# Patient Record
Sex: Female | Born: 1965 | Race: White | Hispanic: No | Smoking: Former smoker
Health system: Southern US, Community
[De-identification: ages and names within clinical notes are randomized; demographics above are authoritative.]

## PROBLEM LIST (undated history)

## (undated) DIAGNOSIS — E669 Obesity, unspecified: Secondary | ICD-10-CM

## (undated) DIAGNOSIS — D869 Sarcoidosis, unspecified: Secondary | ICD-10-CM

## (undated) DIAGNOSIS — R569 Unspecified convulsions: Secondary | ICD-10-CM

## (undated) DIAGNOSIS — F410 Panic disorder [episodic paroxysmal anxiety] without agoraphobia: Secondary | ICD-10-CM

## (undated) DIAGNOSIS — K219 Gastro-esophageal reflux disease without esophagitis: Secondary | ICD-10-CM

## (undated) DIAGNOSIS — I10 Essential (primary) hypertension: Secondary | ICD-10-CM

## (undated) DIAGNOSIS — E119 Type 2 diabetes mellitus without complications: Secondary | ICD-10-CM

## (undated) DIAGNOSIS — M7070 Other bursitis of hip, unspecified hip: Secondary | ICD-10-CM

## (undated) DIAGNOSIS — M1990 Unspecified osteoarthritis, unspecified site: Secondary | ICD-10-CM

## (undated) DIAGNOSIS — E785 Hyperlipidemia, unspecified: Secondary | ICD-10-CM

## (undated) HISTORY — DX: Other bursitis of hip, unspecified hip: M70.70

## (undated) HISTORY — DX: Sarcoidosis, unspecified: D86.9

## (undated) HISTORY — PX: CHOLECYSTECTOMY: SHX55

## (undated) HISTORY — DX: Obesity, unspecified: E66.9

## (undated) HISTORY — DX: Unspecified osteoarthritis, unspecified site: M19.90

## (undated) HISTORY — DX: Hyperlipidemia, unspecified: E78.5

## (undated) HISTORY — DX: Gastro-esophageal reflux disease without esophagitis: K21.9

## (undated) HISTORY — DX: Type 2 diabetes mellitus without complications: E11.9

## (undated) HISTORY — DX: Unspecified convulsions: R56.9

## (undated) HISTORY — DX: Essential (primary) hypertension: I10

## (undated) HISTORY — DX: Panic disorder (episodic paroxysmal anxiety): F41.0

---

## 1999-02-08 ENCOUNTER — Other Ambulatory Visit: Admission: RE | Admit: 1999-02-08 | Discharge: 1999-02-08 | Payer: Self-pay | Admitting: Family Medicine

## 2000-03-27 ENCOUNTER — Other Ambulatory Visit: Admission: RE | Admit: 2000-03-27 | Discharge: 2000-03-27 | Payer: Self-pay | Admitting: Family Medicine

## 2001-05-22 ENCOUNTER — Other Ambulatory Visit: Admission: RE | Admit: 2001-05-22 | Discharge: 2001-05-22 | Payer: Self-pay | Admitting: Family Medicine

## 2002-09-30 ENCOUNTER — Other Ambulatory Visit: Admission: RE | Admit: 2002-09-30 | Discharge: 2002-09-30 | Payer: Self-pay | Admitting: Family Medicine

## 2003-10-20 ENCOUNTER — Other Ambulatory Visit: Admission: RE | Admit: 2003-10-20 | Discharge: 2003-10-20 | Payer: Self-pay | Admitting: Family Medicine

## 2004-10-18 ENCOUNTER — Other Ambulatory Visit: Admission: RE | Admit: 2004-10-18 | Discharge: 2004-10-18 | Payer: Self-pay | Admitting: *Deleted

## 2005-01-13 ENCOUNTER — Encounter (INDEPENDENT_AMBULATORY_CARE_PROVIDER_SITE_OTHER): Payer: Self-pay | Admitting: *Deleted

## 2005-01-13 ENCOUNTER — Encounter: Payer: Self-pay | Admitting: Pulmonary Disease

## 2005-01-13 ENCOUNTER — Ambulatory Visit (HOSPITAL_COMMUNITY): Admission: RE | Admit: 2005-01-13 | Discharge: 2005-01-13 | Payer: Self-pay | Admitting: Thoracic Surgery

## 2005-11-14 ENCOUNTER — Other Ambulatory Visit: Admission: RE | Admit: 2005-11-14 | Discharge: 2005-11-14 | Payer: Self-pay | Admitting: Family Medicine

## 2007-02-21 ENCOUNTER — Other Ambulatory Visit: Admission: RE | Admit: 2007-02-21 | Discharge: 2007-02-21 | Payer: Self-pay | Admitting: Family Medicine

## 2009-02-26 ENCOUNTER — Ambulatory Visit: Payer: Self-pay | Admitting: Cardiovascular Disease

## 2009-02-26 ENCOUNTER — Ambulatory Visit: Payer: Self-pay | Admitting: Cardiology

## 2009-02-26 DIAGNOSIS — K219 Gastro-esophageal reflux disease without esophagitis: Secondary | ICD-10-CM

## 2009-02-26 DIAGNOSIS — I10 Essential (primary) hypertension: Secondary | ICD-10-CM

## 2009-02-26 LAB — CONVERTED CEMR LAB
CO2: 30 meq/L (ref 19–32)
Chloride: 100 meq/L (ref 96–112)
GFR calc non Af Amer: 115.75 mL/min (ref >60)
Glucose, Bld: 122 mg/dL — ABNORMAL HIGH (ref 70–99)
Hemoglobin: 13.2 g/dL (ref 12.0–15.0)
Lymphocytes Relative: 20.2 % (ref 12.0–46.0)
MCHC: 34.5 g/dL (ref 30.0–36.0)
Monocytes Absolute: 0.7 10*3/uL (ref 0.1–1.0)
Neutro Abs: 6.4 10*3/uL (ref 1.4–7.7)
Platelets: 271 10*3/uL (ref 150.0–400.0)
Potassium: 3.6 meq/L (ref 3.5–5.1)
RDW: 11.4 % — ABNORMAL LOW (ref 11.5–14.6)
Sed Rate: 54 mm/hr — ABNORMAL HIGH (ref 0–22)
TSH: 1.74 microintl units/mL (ref 0.35–5.50)
WBC: 9 10*3/uL (ref 4.5–10.5)

## 2009-03-01 ENCOUNTER — Encounter: Payer: Self-pay | Admitting: Cardiovascular Disease

## 2009-03-01 ENCOUNTER — Ambulatory Visit (HOSPITAL_COMMUNITY): Admission: RE | Admit: 2009-03-01 | Discharge: 2009-03-01 | Payer: Self-pay | Admitting: Cardiovascular Disease

## 2009-03-01 ENCOUNTER — Ambulatory Visit: Payer: Self-pay

## 2009-03-18 ENCOUNTER — Ambulatory Visit: Payer: Self-pay | Admitting: Pulmonary Disease

## 2009-03-18 DIAGNOSIS — J309 Allergic rhinitis, unspecified: Secondary | ICD-10-CM | POA: Insufficient documentation

## 2009-03-18 DIAGNOSIS — R0602 Shortness of breath: Secondary | ICD-10-CM | POA: Insufficient documentation

## 2009-04-01 ENCOUNTER — Ambulatory Visit: Payer: Self-pay | Admitting: Pulmonary Disease

## 2009-04-01 ENCOUNTER — Encounter: Payer: Self-pay | Admitting: Internal Medicine

## 2009-04-01 ENCOUNTER — Ambulatory Visit (HOSPITAL_COMMUNITY): Admission: RE | Admit: 2009-04-01 | Discharge: 2009-04-01 | Payer: Self-pay | Admitting: Pulmonary Disease

## 2009-04-15 ENCOUNTER — Telehealth (INDEPENDENT_AMBULATORY_CARE_PROVIDER_SITE_OTHER): Payer: Self-pay | Admitting: *Deleted

## 2009-05-03 ENCOUNTER — Ambulatory Visit: Payer: Self-pay | Admitting: Pulmonary Disease

## 2011-01-01 ENCOUNTER — Encounter: Payer: Self-pay | Admitting: Orthopedic Surgery

## 2011-03-23 LAB — BLOOD GAS, ARTERIAL
FIO2: 0.21 %
O2 Saturation: 95.9 %
pCO2 arterial: 40.3 mmHg (ref 35.0–45.0)

## 2011-04-25 NOTE — Assessment & Plan Note (Signed)
[facility]                            CARDIOLOGY OFFICE NOTE   Ellen Delgado, Ellen Delgado                        MRN:          [id number]  DATE:02/26/2009                            DOB:          July 23, 1966    A 45 year old patient was seen by Dr. Mamie Laurel complaining of some  atypical chest pain, some fluttering in her ears, increasing shortness  of breath.  Unfortunately, the patient is a horrible historian.  She  apparently had some chronic lung problem and has been on inhalers for  quite sometime.  She has had a previous what sounds like tracheal  procedure by Dr. Edwyna Shell.   She is a previous smoker and is currently not smoking.  She has not had  any recent cough or shortness of breath is functional, but seems real.  She has not had any active wheezing.   From a cardiac standpoint, she has never had a problem.  Her pulses have  been little bit elevated lately.  She has not had any recent lab work.  I reviewed her chest x-ray, Birdena Jubilee did in our office and she has  elevated diaphragms, but no active lung disease that I can see and no  cardiomegaly.   In terms of her symptoms, she has had no history of DVT or PE.  She has  had no increase in lower extremity edema.  She has been overweight and  this is chronic.   Chest pain was in the center of her chest.  It is fleeting.  It is a 1  time episode.  She has no exertional chest pain.  There has been no  associated diaphoresis.   Her past medical history is otherwise remarkable for previous smoking,  previous surgery by Dr. Edwyna Shell on her trachea for inflammation,  previous gallbladder surgery.   Her review of systems otherwise remarkable for occasional swelling in  her ankles currently stable, chronic shortness of breath, and question  of high blood pressure.   She is married.  She has had 3 miscarriages and has no children.  She is  a previous smoker.  She does not drink.  She is currently  unemployed and  used to work at [employer].   She is sedentary.  Family history is unremarkable.  She is on Tegretol  400 a day, Nexium 40 a day, fexofenadine 180 a day, Arthrotec, Ventolin,  and Advair inhalers, lisinopril, HCTZ 22 and 25.   PHYSICAL EXAMINATION:  GENERAL:  Her exam is remarkable for an obese  female in no distress.  VITAL SIGNS:  Pulse is 98, blood pressure is 150/80, weight is 230.  HEENT:  Unremarkable.  Carotids normal without bruit.  No  lymphadenopathy, thyromegaly, or JVP elevation.  She has a subtracheal  scar.  LUNGS:  Have decreased diaphragmatic motion, but no active wheezing or  rhonchi.  HEART:  S1 and S2, distant heart sounds.  PMI not palpable.  ABDOMEN:  Protuberant with striae.  No AAA.  No tenderness.  No bruit.  No hepatosplenomegaly or hepatojugular reflux.  EXTREMITIES:  Distal pulses are intact.  No edema.  NEURO:  Nonfocal.  SKIN:  Warm and dry.  MUSCULOSKELETAL:  No muscular weakness.   EKG is normal outside of tachycardia.  There is low voltage due to body  habitus.  Chest x-ray from [facility] office reviewed.   IMPRESSION:  1. The patient has some sort of chronic lung disease and is on      inhalers.  She is a previous smoker with a previous tracheal      procedure.  I will refer her to pulmonary.  I suspect, she will      need further workup with pulmonary function tests given an acute on      chronic shortness of breath.  We will check a chest computerized      tomography to rule out pulmonary embolism and do high-resolution      lung windows.  2. Relative tachycardia and dyspnea.  Check 2-D echocardiogram.  Her      blood pressure needs a little bit better control.  We will add      Coreg 6.25 b.i.d. and check her 2-D echocardiogram to rule out a      cardiac problem.  In light of her symptoms, I would also like to check some basic blood  work to make sure she is not anemic or hyperthyroid.  We will do a  baseline blood gases  well and a sed rate to rule out active connective  tissue disease.  1. Hypertension.  Add Coreg.  Continue lisinopril,      hydrochlorothiazide.  2. Previous history of seizures.  Continue Tegretol.   I will see the patient back in 8 weeks, and we will try to get her into  see pulmonary as well.  I will go over these tests with her.     Noralyn Pick. Eden Emms, MD, [facility]  Electronically Signed    PCN/MedQ  DD: 02/26/2009  DT: 02/27/2009  Job #: [id number]   cc:   Paulita Cradle, FNP

## 2011-04-28 NOTE — Op Note (Signed)
NAMEJANITH, NIELSON NO.:  [id number]   MEDICAL RECORD NO.:  [id number]          PATIENT TYPE:  OIB   LOCATION:  2899                         FACILITY:  [facility]   PHYSICIAN:  Ines Bloomer, M.D. DATE OF BIRTH:  1966/03/09   DATE OF PROCEDURE:  01/13/2005  DATE OF DISCHARGE:                                 OPERATIVE REPORT   PREOPERATIVE DIAGNOSIS:  Bilateral hilar adenopathy.   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   PROCEDURE:  Fiberoptic bronchoscopy with a video and video mediastinoscopy.   SURGEON:  Ines Bloomer, M.D.   ANESTHESIA:  General anesthesia.   DESCRIPTION OF PROCEDURE:  After adequate general anesthesia, the fiberoptic  video bronchoscope was passed through the endotracheal tube.  The carina was  in the midline.  The left mainstem, left upper lobe, and left lower lobe  orifices were normal.  The right mainstem, right upper lobe, and right  middle lobe, and right lower lobe orifices were normal.  The washings were  taken of this.  The video bronchoscope was removed and the anterior neck was  prepped and draped in the usual sterile fashion.  A transverse incision was  made above the sternal notch and carried down with electrocautery through  the subcutaneous tissue and fascia.  The pretracheal fascia was entered and  biopsies of 2R and 4R node were done.  Frozen section of the 4R revealed  noncaseating granulomas, probable sarcoid.  __________ sent for culture.  Strap muscles closed with 2-0 Vicryl, subcutaneous tissue with 3-0 Vicryl,  and Dermabond for the skin.  The patient returned to the recovery room in  stable condition.      DPB/MEDQ  D:  01/13/2005  T:  01/13/2005  Job:  [id number]   cc:   Ernestina Penna, M.D.  [address] [place]  [place] [address]  Fax: [phone]

## 2013-04-01 ENCOUNTER — Other Ambulatory Visit: Payer: Self-pay

## 2013-04-01 MED ORDER — LISINOPRIL-HYDROCHLOROTHIAZIDE 20-25 MG PO TABS: 1.0000 | ORAL_TABLET | Freq: Every day | ORAL | Status: DC

## 2013-04-24 ENCOUNTER — Other Ambulatory Visit: Payer: Self-pay

## 2013-04-24 MED ORDER — METFORMIN HCL 500 MG PO TABS: 500.0000 mg | ORAL_TABLET | Freq: Two times a day (BID) | ORAL | Status: DC

## 2013-04-28 ENCOUNTER — Ambulatory Visit (INDEPENDENT_AMBULATORY_CARE_PROVIDER_SITE_OTHER): Payer: BC Managed Care – PPO | Admitting: Nurse Practitioner

## 2013-04-28 ENCOUNTER — Encounter: Payer: Self-pay | Admitting: Nurse Practitioner

## 2013-04-28 VITALS — BP 117/77 | HR 77 | Temp 97.4°F | Ht 62.25 in | Wt 211.0 lb

## 2013-04-28 DIAGNOSIS — R569 Unspecified convulsions: Secondary | ICD-10-CM

## 2013-04-28 DIAGNOSIS — F410 Panic disorder [episodic paroxysmal anxiety] without agoraphobia: Secondary | ICD-10-CM

## 2013-04-28 DIAGNOSIS — E785 Hyperlipidemia, unspecified: Secondary | ICD-10-CM

## 2013-04-28 DIAGNOSIS — E119 Type 2 diabetes mellitus without complications: Secondary | ICD-10-CM

## 2013-04-28 DIAGNOSIS — I10 Essential (primary) hypertension: Secondary | ICD-10-CM

## 2013-04-28 DIAGNOSIS — E1169 Type 2 diabetes mellitus with other specified complication: Secondary | ICD-10-CM | POA: Insufficient documentation

## 2013-04-28 DIAGNOSIS — G8929 Other chronic pain: Secondary | ICD-10-CM

## 2013-04-28 DIAGNOSIS — M542 Cervicalgia: Secondary | ICD-10-CM

## 2013-04-28 LAB — COMPLETE METABOLIC PANEL WITH GFR
AST: 13 U/L (ref 0–37)
BUN: 29 mg/dL — ABNORMAL HIGH (ref 6–23)
Calcium: 9.8 mg/dL (ref 8.4–10.5)
GFR, Est African American: >89 mL/min
GFR, Est Non African American: 85 mL/min
Potassium: 4.6 mEq/L (ref 3.5–5.3)
Sodium: 138 mEq/L (ref 135–145)
Total Bilirubin: 0.3 mg/dL (ref 0.3–1.2)

## 2013-04-28 LAB — POCT GLYCOSYLATED HEMOGLOBIN (HGB A1C): Hemoglobin A1C: 6.5

## 2013-04-28 MED ORDER — SIMVASTATIN 20 MG PO TABS: 20.0000 mg | ORAL_TABLET | Freq: Every evening | ORAL | Status: DC

## 2013-04-28 MED ORDER — CARBAMAZEPINE ER 400 MG PO TB12: 400.0000 mg | ORAL_TABLET | Freq: Two times a day (BID) | ORAL | Status: DC

## 2013-04-28 MED ORDER — CARVEDILOL 6.25 MG PO TABS: 6.2500 mg | ORAL_TABLET | Freq: Two times a day (BID) | ORAL | Status: DC

## 2013-04-28 MED ORDER — CYCLOBENZAPRINE HCL 10 MG PO TABS
10.0000 mg | ORAL_TABLET | Freq: Three times a day (TID) | ORAL | Status: DC | PRN
Start: 2013-04-28 — End: 2013-08-06

## 2013-04-28 MED ORDER — LISINOPRIL-HYDROCHLOROTHIAZIDE 20-25 MG PO TABS: 1.0000 | ORAL_TABLET | Freq: Every day | ORAL | Status: DC

## 2013-04-28 MED ORDER — DICLOFENAC-MISOPROSTOL 75-0.2 MG PO TBEC: 1.0000 | DELAYED_RELEASE_TABLET | Freq: Every day | ORAL | Status: DC

## 2013-04-28 MED ORDER — METFORMIN HCL 500 MG PO TABS: 500.0000 mg | ORAL_TABLET | Freq: Two times a day (BID) | ORAL | Status: DC

## 2013-04-28 MED ORDER — BUSPIRONE HCL 15 MG PO TABS
15.0000 mg | ORAL_TABLET | Freq: Two times a day (BID) | ORAL | Status: DC | PRN
Start: 2013-04-28 — End: 2013-09-03

## 2013-04-28 NOTE — Patient Instructions (Signed)
Diets for Diabetes, Food Labeling Look at food labels to help you decide how much of a product you can eat. You will want to check the amount of total carbohydrate in a serving to see how the food fits into your meal plan. In the list of ingredients, the ingredient present in the largest amount by weight must be listed first, followed by the other ingredients in descending order. STANDARD OF IDENTITY Most products have a list of ingredients. However, foods that the Food and Drug Administration (FDA) has given a standard of identity do not need a list of ingredients. A standard of identity means that a food must contain certain ingredients if it is called a particular name. Examples are mayonnaise, peanut butter, ketchup, jelly, and cheese. LABELING TERMS There are many terms found on food labels. Some of these terms have specific definitions. Some terms are regulated by the FDA, and the FDA has clearly specified how they can be used. Others are not regulated or well-defined and can be misleading and confusing. SPECIFICALLY DEFINED TERMS Nutritive Sweetener.  A sweetener that contains calories,such as table sugar or honey. Nonnutritive Sweetener.  A sweetener with few or no calories,such as saccharin, aspartame, sucralose, and cyclamate. LABELING TERMS REGULATED BY THE FDA Free.  The product contains only a tiny or small amount of fat, cholesterol, sodium, sugar, or calories. For example, a "fat-free" product will contain less than 0.5 g of fat per serving. Low.  A food described as "low" in fat, saturated fat, cholesterol, sodium, or calories could be eaten fairly often without exceeding dietary guidelines. For example, "low in fat" means no more than 3 g of fat per serving. Lean.  "Lean" and "extra lean" are U.S. Department of Agriculture (USDA) terms for use on meat and poultry products. "Lean" means the product contains less than 10 g of fat, 4 g of saturated fat, and 95 mg of cholesterol  per serving. "Lean" is not as low in fat as a product labeled "low." Extra Lean.  "Extra lean" means the product contains less than 5 g of fat, 2 g of saturated fat, and 95 mg of cholesterol per serving. While "extra lean" has less fat than "lean," it is still higher in fat than a product labeled "low." Reduced, Less, Fewer.  A diet product that contains 25% less of a nutrient or calories than the regular version. For example, hot dogs might be labeled "25% less fat than our regular hot dogs." Light/Lite.  A diet product that contains  fewer calories or  the fat of the original. For example, "light in sodium" means a product with  the usual sodium. More.  One serving contains at least 10% more of the daily value of a vitamin, mineral, or fiber than usual. Good Source Of.  One serving contains 10% to 19% of the daily value for a particular vitamin, mineral, or fiber. Excellent Source Of.  One serving contains 20% or more of the daily value for a particular nutrient. Other terms used might be "high in" or "rich in." Enriched or Fortified.  The product contains added vitamins, minerals, or protein. Nutrition labeling must be used on enriched or fortified foods. Imitation.  The product has been altered so that it is lower in protein, vitamins, or minerals than the usual food,such as imitation peanut butter. Total Fat.  The number listed is the total of all fat found in a serving of the product. Under total fat, food labels must list saturated fat and   trans fat, which are associated with raising bad cholesterol and an increased risk of heart blood vessel disease. Saturated Fat.  Mainly fats from animal-based sources. Some examples are red meat, cheese, cream, whole milk, and coconut oil. Trans Fat.  Found in some fried snack foods, packaged foods, and fried restaurant foods. It is recommended you eat as close to 0 g of trans fat as possible, since it raises bad cholesterol and lowers  good cholesterol. Polyunsaturated and Monounsaturated Fats.  More healthful fats. These fats are from plant sources. Total Carbohydrate.  The number of carbohydrate grams in a serving of the product. Under total carbohydrate are listed the other carbohydrate sources, such as dietary fiber and sugars. Dietary Fiber.  A carbohydrate from plant sources. Sugars.  Sugars listed on the label contain all naturally occurring sugars as well as added sugars. LABELING TERMS NOT REGULATED BY THE FDA Sugarless.  Table sugar (sucrose) has not been added. However, the manufacturer may use another form of sugar in place of sucrose to sweeten the product. For example, sugar alcohols are used to sweeten foods. Sugar alcohols are a form of sugar but are not table sugar. If a product contains sugar alcohols in place of sucrose, it can still be labeled "sugarless." Low Salt, Salt-Free, Unsalted, No Salt, No Salt Added, Without Added Salt.  Food that is usually processed with salt has been made without salt. However, the food may contain sodium-containing additives, such as preservatives, leavening agents, or flavorings. Natural.  This term has no legal meaning. Organic.  Foods that are certified as organic have been inspected and approved by the USDA to ensure they are produced without pesticides, fertilizers containing synthetic ingredients, bioengineering, or ionizing radiation. Document Released: 11/30/2003 Document Revised: 02/19/2012 Document Reviewed: 06/17/2009 Exit[employer] Patient Information 2013 [employer].  

## 2013-04-28 NOTE — Progress Notes (Signed)
Subjective:    Patient ID: Ellen Delgado, female    DOB: 10/27/1966, 47 y.o.   MRN: [id number]  Hypertension This is a chronic problem. The current episode started more than 1 year ago. The problem is unchanged. The problem is controlled. Pertinent negatives include no blurred vision, chest pain, headaches, malaise/fatigue, peripheral edema or shortness of breath. There are no associated agents to hypertension. Risk factors for coronary artery disease include diabetes mellitus, dyslipidemia, obesity, post-menopausal state and sedentary lifestyle. Past treatments include ACE inhibitors, diuretics and beta blockers. The current treatment provides significant improvement. Compliance problems include exercise and diet.   Hyperlipidemia This is a chronic problem. The current episode started more than 1 year ago. The problem is uncontrolled. Recent lipid tests were reviewed and are high. Exacerbating diseases include diabetes and obesity. Pertinent negatives include no chest pain or shortness of breath. Current antihyperlipidemic treatment includes statins. The current treatment provides moderate improvement of lipids. Compliance problems include adherence to diet and adherence to exercise.  Risk factors for coronary artery disease include diabetes mellitus, hypertension, obesity and post-menopausal.  Diabetes She presents for her follow-up diabetic visit. She has type 2 diabetes mellitus. No MedicAlert identification noted. The initial diagnosis of diabetes was made 3 years ago. Her disease course has been fluctuating. Hypoglycemia symptoms include seizures. Pertinent negatives for hypoglycemia include no headaches. Pertinent negatives for diabetes include no blurred vision, no chest pain, no polydipsia, no polyphagia, no polyuria, no visual change and no weakness. There are no hypoglycemic complications. Symptoms are stable. There are no diabetic complications. Risk factors for coronary artery disease  include dyslipidemia, hypertension, obesity and post-menopausal. Current diabetic treatment includes oral agent (monotherapy). She is compliant with treatment all of the time. Her weight is stable. When asked about meal planning, she reported none. She has not had a previous visit with a dietician. She rarely participates in exercise. Her home blood glucose trend is fluctuating minimally. (Patient doesn't check blood sugars at home.) An ACE inhibitor/angiotensin II receptor blocker is being taken. She does not see a podiatrist.Eye exam is not current (2 years ago- Patient told to make appointment.).  Seizures  This is a chronic problem. Episode onset: last seizure was in 1990. The problem has not changed since onset.There was 1 seizure. The most recent episode lasted more than 5 minutes. Pertinent negatives include no headaches and no chest pain. Characteristics include eye blinking, eye deviation and rhythmic jerking. Characteristics do not include bowel incontinence or bladder incontinence. The episode was witnessed. There was no sensation of an aura present. The seizures did not continue in the ED. There has been no fever.  GAD Buspar as directed. Helps a lot with her anxiety.   Review of Systems  Constitutional: Negative for malaise/fatigue.  Eyes: Negative for blurred vision.  Respiratory: Negative for shortness of breath.   Cardiovascular: Negative for chest pain.  Gastrointestinal: Negative for bowel incontinence.  Endocrine: Negative for polydipsia, polyphagia and polyuria.  Genitourinary: Negative for bladder incontinence.  Neurological: Positive for seizures. Negative for weakness and headaches.  All other systems reviewed and are negative.       Objective:   Physical Exam  Constitutional: She is oriented to person, place, and time. She appears well-developed and well-nourished.  HENT:  Nose: Nose normal.  Mouth/Throat: Oropharynx is clear and moist.  Eyes: EOM are normal.   Neck: Trachea normal, normal range of motion and full passive range of motion without pain. Neck supple. No JVD present. Carotid bruit  is not present. No thyromegaly present.  Cardiovascular: Normal rate, regular rhythm, normal heart sounds and intact distal pulses.  Exam reveals no gallop and no friction rub.   No murmur heard. Pulmonary/Chest: Effort normal and breath sounds normal.  Abdominal: Soft. Bowel sounds are normal. She exhibits no distension and no mass. There is no tenderness.  Musculoskeletal: Normal range of motion.  Lymphadenopathy:    She has no cervical adenopathy.  Neurological: She is alert and oriented to person, place, and time. She has normal reflexes.  See diabetic foot exam  Skin: Skin is warm and dry.  Psychiatric: She has a normal mood and affect. Her behavior is normal. Judgment and thought content normal.  BP 117/77  Pulse 77  Temp(Src) 97.4 F (36.3 C) (Oral)  Ht 5' 2.25" (1.581 m)  Wt 211 lb (95.709 kg)  BMI 38.29 kg/m2   Results for orders placed in visit on 04/28/13  POCT GLYCOSYLATED HEMOGLOBIN (HGB A1C)      Result Value Range   Hemoglobin A1C 6.5%           Assessment & Plan:  1. Hyperlipidemia Low fat diet and exercise - NMR Lipoprofile with Lipids - simvastatin (ZOCOR) 20 MG tablet; Take 1 tablet (20 mg total) by mouth every evening.  Dispense: 30 tablet; Refill: 5  2. Diabetes Watch carbs in diet Really need to Check blood sugars at home - POCT glycosylated hemoglobin (Hb A1C) - metFORMIN (GLUCOPHAGE) 500 MG tablet; Take 1 tablet (500 mg total) by mouth 2 (two) times daily with a meal.  Dispense: 60 tablet; Refill: 5  3. Seizures  - carbamazepine (TEGRETOL XR) 400 MG 12 hr tablet; Take 1 tablet (400 mg total) by mouth 2 (two) times daily.  Dispense: 60 tablet; Refill: 5  4. Panic attacks Stress management - busPIRone (BUSPAR) 15 MG tablet; Take 1 tablet (15 mg total) by mouth 2 (two) times daily as needed. Every 12 hours as  needed  Dispense: 60 tablet; Refill: 5  5. Hypertension Low Na+ diet - COMPLETE METABOLIC PANEL WITH GFR - carvedilol (COREG) 6.25 MG tablet; Take 1 tablet (6.25 mg total) by mouth 2 (two) times daily with a meal.  Dispense: 60 tablet; Refill: 5 - lisinopril-hydrochlorothiazide (PRINZIDE,ZESTORETIC) 20-25 MG per tablet; Take 1 tablet by mouth daily.  Dispense: 90 tablet; Refill: 1  6. Chronic neck pain Moist heat No heavy lifting - cyclobenzaprine (FLEXERIL) 10 MG tablet; Take 1 tablet (10 mg total) by mouth 3 (three) times daily as needed for muscle spasms.  Dispense: 30 tablet; Refill: 2 - Diclofenac-Misoprostol (ARTHROTEC) 75-0.2 MG TBEC; Take 1 tablet by mouth daily.  Dispense: 60 tablet; Refill: 1   Mary-Margaret Daphine Deutscher, FNP

## 2013-04-29 ENCOUNTER — Other Ambulatory Visit: Payer: Self-pay

## 2013-04-29 DIAGNOSIS — G8929 Other chronic pain: Secondary | ICD-10-CM

## 2013-04-29 MED ORDER — DICLOFENAC-MISOPROSTOL 75-0.2 MG PO TBEC: 1.0000 | DELAYED_RELEASE_TABLET | Freq: Two times a day (BID) | ORAL | Status: DC

## 2013-05-02 LAB — NMR LIPOPROFILE WITH LIPIDS
Cholesterol, Total: 204 mg/dL — ABNORMAL HIGH (ref <200)
HDL Size: 9.2 nm (ref >=9.2)
LDL (calc): 92 mg/dL (ref <100)
Large HDL-P: 4.9 umol/L (ref >=4.8)

## 2013-05-06 ENCOUNTER — Other Ambulatory Visit: Payer: Self-pay | Admitting: Nurse Practitioner

## 2013-05-06 MED ORDER — SIMVASTATIN 40 MG PO TABS: 40.0000 mg | ORAL_TABLET | Freq: Every day | ORAL | Status: DC

## 2013-06-09 LAB — PULMONARY FUNCTION TEST

## 2013-07-29 ENCOUNTER — Ambulatory Visit: Payer: BC Managed Care – PPO | Admitting: Nurse Practitioner

## 2013-08-05 ENCOUNTER — Other Ambulatory Visit: Payer: Self-pay

## 2013-08-05 DIAGNOSIS — G8929 Other chronic pain: Secondary | ICD-10-CM

## 2013-08-05 MED ORDER — ASPIRIN EC 81 MG PO TBEC: 81.0000 mg | DELAYED_RELEASE_TABLET | Freq: Every day | ORAL | Status: DC

## 2013-08-05 MED ORDER — DICLOFENAC-MISOPROSTOL 75-0.2 MG PO TBEC: 1.0000 | DELAYED_RELEASE_TABLET | Freq: Two times a day (BID) | ORAL | Status: DC

## 2013-08-06 ENCOUNTER — Encounter: Payer: Self-pay | Admitting: Nurse Practitioner

## 2013-08-06 ENCOUNTER — Ambulatory Visit (INDEPENDENT_AMBULATORY_CARE_PROVIDER_SITE_OTHER): Payer: BC Managed Care – PPO | Admitting: Nurse Practitioner

## 2013-08-06 VITALS — BP 110/73 | HR 76 | Temp 98.0°F | Ht 62.25 in | Wt 207.0 lb

## 2013-08-06 DIAGNOSIS — G8929 Other chronic pain: Secondary | ICD-10-CM

## 2013-08-06 DIAGNOSIS — E119 Type 2 diabetes mellitus without complications: Secondary | ICD-10-CM

## 2013-08-06 DIAGNOSIS — I10 Essential (primary) hypertension: Secondary | ICD-10-CM

## 2013-08-06 DIAGNOSIS — R569 Unspecified convulsions: Secondary | ICD-10-CM

## 2013-08-06 DIAGNOSIS — M542 Cervicalgia: Secondary | ICD-10-CM

## 2013-08-06 DIAGNOSIS — E785 Hyperlipidemia, unspecified: Secondary | ICD-10-CM

## 2013-08-06 DIAGNOSIS — F410 Panic disorder [episodic paroxysmal anxiety] without agoraphobia: Secondary | ICD-10-CM

## 2013-08-06 MED ORDER — DICLOFENAC-MISOPROSTOL 75-0.2 MG PO TBEC: 1.0000 | DELAYED_RELEASE_TABLET | Freq: Two times a day (BID) | ORAL | Status: DC

## 2013-08-06 MED ORDER — CYCLOBENZAPRINE HCL 10 MG PO TABS: 10.0000 mg | ORAL_TABLET | Freq: Three times a day (TID) | ORAL | Status: DC | PRN

## 2013-08-06 MED ORDER — SIMVASTATIN 40 MG PO TABS: 40.0000 mg | ORAL_TABLET | Freq: Every day | ORAL | Status: DC

## 2013-08-06 MED ORDER — LISINOPRIL-HYDROCHLOROTHIAZIDE 20-25 MG PO TABS: 1.0000 | ORAL_TABLET | Freq: Every day | ORAL | Status: DC

## 2013-08-06 NOTE — Progress Notes (Signed)
Subjective:    Patient ID: Ellen Delgado, female    DOB: March 27, 1966, 47 y.o.   MRN: [id number]  Hypertension This is a chronic problem. The current episode started more than 1 year ago. The problem is unchanged. The problem is controlled. Pertinent negatives include no blurred vision, chest pain, headaches, malaise/fatigue, peripheral edema or shortness of breath. There are no associated agents to hypertension. Risk factors for coronary artery disease include diabetes mellitus, dyslipidemia, obesity, post-menopausal state and sedentary lifestyle. Past treatments include ACE inhibitors, diuretics and beta blockers. The current treatment provides significant improvement. Compliance problems include exercise and diet.   Hyperlipidemia This is a chronic problem. The current episode started more than 1 year ago. The problem is uncontrolled. Recent lipid tests were reviewed and are high. Exacerbating diseases include diabetes and obesity. Pertinent negatives include no chest pain or shortness of breath. Current antihyperlipidemic treatment includes statins. The current treatment provides moderate improvement of lipids. Compliance problems include adherence to diet and adherence to exercise.  Risk factors for coronary artery disease include diabetes mellitus, hypertension, obesity and post-menopausal.  Diabetes She presents for her follow-up diabetic visit. She has type 2 diabetes mellitus. No MedicAlert identification noted. The initial diagnosis of diabetes was made 3 years ago. Her disease course has been fluctuating. Hypoglycemia symptoms include seizures. Pertinent negatives for hypoglycemia include no headaches. Pertinent negatives for diabetes include no blurred vision, no chest pain, no polydipsia, no polyphagia, no polyuria, no visual change and no weakness. There are no hypoglycemic complications. Symptoms are stable. There are no diabetic complications. Risk factors for coronary artery disease  include dyslipidemia, hypertension, obesity and post-menopausal. Current diabetic treatment includes oral agent (monotherapy). She is compliant with treatment all of the time. Her weight is stable. When asked about meal planning, she reported none. She has not had a previous visit with a dietician. She rarely participates in exercise. Her home blood glucose trend is fluctuating minimally. (Patient doesn't check blood sugars at home.) An ACE inhibitor/angiotensin II receptor blocker is being taken. She does not see a podiatrist.Eye exam is not current (2 years ago- Patient told to make appointment.).  Seizures  This is a chronic problem. Episode onset: last seizure was in 1990. No seizures since. The problem has not changed since onset.There was 1 seizure. The most recent episode lasted more than 5 minutes. Pertinent negatives include no headaches and no chest pain. Characteristics include eye blinking, eye deviation and rhythmic jerking. Characteristics do not include bowel incontinence or bladder incontinence. The episode was witnessed. There was no sensation of an aura present. The seizures did not continue in the ED. There has been no fever.  GAD Buspar as directed. Helps a lot with her anxiety.   Review of Systems  Constitutional: Negative for malaise/fatigue.  Eyes: Negative for blurred vision.  Respiratory: Negative for shortness of breath.   Cardiovascular: Negative for chest pain.  Gastrointestinal: Negative for bowel incontinence.  Endocrine: Negative for polydipsia, polyphagia and polyuria.  Genitourinary: Negative for bladder incontinence.  Neurological: Positive for seizures. Negative for weakness and headaches.  All other systems reviewed and are negative.       Objective:   Physical Exam  Constitutional: She is oriented to person, place, and time. She appears well-developed and well-nourished.  HENT:  Nose: Nose normal.  Mouth/Throat: Oropharynx is clear and moist.  Eyes: EOM  are normal.  Neck: Trachea normal, normal range of motion and full passive range of motion without pain. Neck supple. No JVD  present. Carotid bruit is not present. No thyromegaly present.  Cardiovascular: Normal rate, regular rhythm, normal heart sounds and intact distal pulses.  Exam reveals no gallop and no friction rub.   No murmur heard. Pulmonary/Chest: Effort normal and breath sounds normal.  Abdominal: Soft. Bowel sounds are normal. She exhibits no distension and no mass. There is no tenderness.  Musculoskeletal: Normal range of motion.  Lymphadenopathy:    She has no cervical adenopathy.  Neurological: She is alert and oriented to person, place, and time. She has normal reflexes.  See diabetic foot exam  Skin: Skin is warm and dry.  Psychiatric: She has a normal mood and affect. Her behavior is normal. Judgment and thought content normal.  BP 110/73  Pulse 76  Temp(Src) 98 F (36.7 C) (Oral)  Ht 5' 2.25" (1.581 m)  Wt 207 lb (93.895 kg)  BMI 37.56 kg/m2  LMP 08/05/2013   Results for orders placed in visit on 08/06/13  POCT GLYCOSYLATED HEMOGLOBIN (HGB A1C)      Result Value Range   Hemoglobin A1C 6.3%         Assessment & Plan:  1. Hyperlipidemia Low fat diet and exercise - NMR Lipoprofile with Lipids - 2. Diabetes Watch carbs in diet Really need to Check blood sugars at home - POCT glycosylated hemoglobin (Hb A1C) 3. Seizures   4. Panic attacks Stress management 5. Hypertension Low Na+ diet - COMPLETE METABOLIC PANEL WITH GFR 6. Chronic neck pain Moist heat No heavy lifting  Continue all meds Health maintenance reviewed - Mary-Margaret Daphine Deutscher, FNP

## 2013-08-06 NOTE — Patient Instructions (Signed)

## 2013-08-06 NOTE — Addendum Note (Signed)
Addended by: Orma Render F on: 08/06/2013 09:06 AM   Modules accepted: Orders

## 2013-08-08 LAB — CMP14+EGFR
AST: 13 IU/L (ref 0–40)
Albumin/Globulin Ratio: 1.7 (ref 1.1–2.5)
Alkaline Phosphatase: 61 IU/L (ref 39–117)
CO2: 25 mmol/L (ref 18–29)
Calcium: 9.4 mg/dL (ref 8.7–10.2)
Total Bilirubin: 0.2 mg/dL (ref 0.0–1.2)

## 2013-08-08 LAB — ARTHRITIS PANEL
Basophils Absolute: 0 10*3/uL (ref 0.0–0.2)
Immature Granulocytes: 0 % (ref 0–2)
Lymphocytes Absolute: 1.7 10*3/uL (ref 0.7–3.1)
MCH: 31 pg (ref 26.6–33.0)
MCV: 94 fL (ref 79–97)
Monocytes Absolute: 0.6 10*3/uL (ref 0.1–0.9)
Platelets: 301 10*3/uL (ref 150–379)
RDW: 12.7 % (ref 12.3–15.4)

## 2013-08-08 LAB — NMR, LIPOPROFILE
Cholesterol: 175 mg/dL (ref <200)
HDL Cholesterol by NMR: 40 mg/dL (ref >=40)
HDL Particle Number: 27.3 umol/L — ABNORMAL LOW (ref >=30.5)
LP-IR Score: 76 — ABNORMAL HIGH (ref <=45)
Small LDL Particle Number: 1036 nmol/L — ABNORMAL HIGH (ref <=527)
Triglycerides by NMR: 424 mg/dL — ABNORMAL HIGH (ref <150)

## 2013-08-08 LAB — CARBAMAZEPINE LEVEL, TOTAL: Carbamazepine Lvl: 5.1 ug/mL (ref 4.0–12.0)

## 2013-08-13 ENCOUNTER — Telehealth: Payer: Self-pay | Admitting: Nurse Practitioner

## 2013-08-14 NOTE — Telephone Encounter (Signed)
Patient aware.

## 2013-09-02 ENCOUNTER — Other Ambulatory Visit: Payer: BC Managed Care – PPO | Admitting: Nurse Practitioner

## 2013-09-03 ENCOUNTER — Encounter: Payer: Self-pay | Admitting: Nurse Practitioner

## 2013-09-03 ENCOUNTER — Ambulatory Visit (INDEPENDENT_AMBULATORY_CARE_PROVIDER_SITE_OTHER): Payer: BC Managed Care – PPO | Admitting: Nurse Practitioner

## 2013-09-03 VITALS — BP 107/63 | HR 72 | Temp 96.6°F | Ht 62.0 in | Wt 205.0 lb

## 2013-09-03 DIAGNOSIS — I10 Essential (primary) hypertension: Secondary | ICD-10-CM

## 2013-09-03 DIAGNOSIS — M76899 Other specified enthesopathies of unspecified lower limb, excluding foot: Secondary | ICD-10-CM

## 2013-09-03 DIAGNOSIS — E119 Type 2 diabetes mellitus without complications: Secondary | ICD-10-CM

## 2013-09-03 DIAGNOSIS — M7071 Other bursitis of hip, right hip: Secondary | ICD-10-CM

## 2013-09-03 DIAGNOSIS — R195 Other fecal abnormalities: Secondary | ICD-10-CM

## 2013-09-03 DIAGNOSIS — Z01419 Encounter for gynecological examination (general) (routine) without abnormal findings: Secondary | ICD-10-CM

## 2013-09-03 DIAGNOSIS — R569 Unspecified convulsions: Secondary | ICD-10-CM

## 2013-09-03 DIAGNOSIS — Z0000 Encounter for general adult medical examination without abnormal findings: Secondary | ICD-10-CM

## 2013-09-03 DIAGNOSIS — G8929 Other chronic pain: Secondary | ICD-10-CM

## 2013-09-03 DIAGNOSIS — F410 Panic disorder [episodic paroxysmal anxiety] without agoraphobia: Secondary | ICD-10-CM

## 2013-09-03 DIAGNOSIS — Z124 Encounter for screening for malignant neoplasm of cervix: Secondary | ICD-10-CM

## 2013-09-03 LAB — POCT URINALYSIS DIPSTICK
Ketones, UA: NEGATIVE
Protein, UA: NEGATIVE
Spec Grav, UA: 1.015
pH, UA: 5

## 2013-09-03 MED ORDER — DICLOFENAC-MISOPROSTOL 75-0.2 MG PO TBEC: 1.0000 | DELAYED_RELEASE_TABLET | Freq: Two times a day (BID) | ORAL | Status: DC

## 2013-09-03 MED ORDER — METFORMIN HCL 500 MG PO TABS: 500.0000 mg | ORAL_TABLET | Freq: Two times a day (BID) | ORAL | Status: DC

## 2013-09-03 MED ORDER — BUSPIRONE HCL 15 MG PO TABS: 15.0000 mg | ORAL_TABLET | Freq: Two times a day (BID) | ORAL | Status: DC | PRN

## 2013-09-03 MED ORDER — SIMVASTATIN 40 MG PO TABS: 40.0000 mg | ORAL_TABLET | Freq: Every day | ORAL | Status: DC

## 2013-09-03 MED ORDER — CARBAMAZEPINE ER 400 MG PO TB12: 400.0000 mg | ORAL_TABLET | Freq: Two times a day (BID) | ORAL | Status: DC

## 2013-09-03 MED ORDER — CARVEDILOL 6.25 MG PO TABS: 6.2500 mg | ORAL_TABLET | Freq: Two times a day (BID) | ORAL | Status: DC

## 2013-09-03 MED ORDER — LISINOPRIL-HYDROCHLOROTHIAZIDE 20-25 MG PO TABS: 1.0000 | ORAL_TABLET | Freq: Every day | ORAL | Status: DC

## 2013-09-03 MED ORDER — METHYLPREDNISOLONE ACETATE 80 MG/ML IJ SUSP
80.0000 mg | Freq: Once | INTRAMUSCULAR | Status: AC
  Administered 2013-09-03: 80 mg via INTRAMUSCULAR

## 2013-09-03 NOTE — Patient Instructions (Signed)
Bursitis Bursitis is a swelling and soreness (inflammation) of a fluid-filled sac (bursa) that overlies and protects a joint. It can be caused by injury, overuse of the joint, arthritis or infection. The joints most likely to be affected are the elbows, shoulders, hips and knees. HOME CARE INSTRUCTIONS   Apply ice to the affected area for 15-20 minutes each hour while awake for 2 days. Put the ice in a plastic bag and place a towel between the bag of ice and your skin.  Rest the injured joint as much as possible, but continue to put the joint through a full range of motion, 4 times per day. (The shoulder joint especially becomes rapidly "frozen" if not used.) When the pain lessens, begin normal slow movements and usual activities.  Only take over-the-counter or prescription medicines for pain, discomfort or fever as directed by your caregiver.  Your caregiver may recommend draining the bursa and injecting medicine into the bursa. This may help the healing process.  Follow all instructions for follow-up with your caregiver. This includes any orthopedic referrals, physical therapy and rehabilitation. Any delay in obtaining necessary care could result in a delay or failure of the bursitis to heal and chronic pain. SEEK IMMEDIATE MEDICAL CARE IF:   Your pain increases even during treatment.  You develop an oral temperature above 102 F (38.9 C) and have heat and inflammation over the involved bursa. MAKE SURE YOU:   Understand these instructions.  Will watch your condition.  Will get help right away if you are not doing well or get worse. Document Released: 11/24/2000 Document Revised: 02/19/2012 Document Reviewed: 10/29/2009 [employer] Patient Information 2014 [employer].  

## 2013-09-03 NOTE — Progress Notes (Signed)
Subjective:    Patient ID: Ellen Delgado, female    DOB: [birth year], 47 y.o.   MRN: [id number]  Patient here today for CPE and PAP- Had regular follow up 1 month ago with lab work. Hypertension This is a chronic problem. The current episode started more than 1 year ago. The problem is unchanged. The problem is controlled. Pertinent negatives include no blurred vision, chest pain, headaches, malaise/fatigue, peripheral edema or shortness of breath. There are no associated agents to hypertension. Risk factors for coronary artery disease include diabetes mellitus, dyslipidemia, obesity, post-menopausal state and sedentary lifestyle. Past treatments include ACE inhibitors, diuretics and beta blockers. The current treatment provides significant improvement. Compliance problems include exercise and diet.   Hyperlipidemia This is a chronic problem. The current episode started more than 1 year ago. The problem is uncontrolled. Recent lipid tests were reviewed and are high. Exacerbating diseases include diabetes and obesity. Pertinent negatives include no chest pain or shortness of breath. Current antihyperlipidemic treatment includes statins. The current treatment provides moderate improvement of lipids. Compliance problems include adherence to diet and adherence to exercise.  Risk factors for coronary artery disease include diabetes mellitus, hypertension, obesity and post-menopausal.  Diabetes She presents for her follow-up diabetic visit. She has type 2 diabetes mellitus. No MedicAlert identification noted. The initial diagnosis of diabetes was made 3 years ago. Her disease course has been fluctuating. Hypoglycemia symptoms include seizures. Pertinent negatives for hypoglycemia include no headaches. Pertinent negatives for diabetes include no blurred vision, no chest pain, no polydipsia, no polyphagia, no polyuria, no visual change and no weakness. There are no hypoglycemic complications. Symptoms are stable.  There are no diabetic complications. Risk factors for coronary artery disease include dyslipidemia, hypertension, obesity and post-menopausal. Current diabetic treatment includes oral agent (monotherapy). She is compliant with treatment all of the time. Her weight is stable. When asked about meal planning, she reported none. She has not had a previous visit with a dietician. She rarely participates in exercise. Her home blood glucose trend is fluctuating minimally. (Patient doesn't check blood sugars at home.) An ACE inhibitor/angiotensin II receptor blocker is being taken. She does not see a podiatrist.Eye exam is not current (2 years ago- Patient told to make appointment.).  Seizures  This is a chronic problem. Episode onset: last seizure was in 1990. The problem has not changed since onset.There was 1 seizure. The most recent episode lasted more than 5 minutes. Pertinent negatives include no headaches and no chest pain. Characteristics include eye blinking, eye deviation and rhythmic jerking. Characteristics do not include bowel incontinence or bladder incontinence. The episode was witnessed. There was no sensation of an aura present. The seizures did not continue in the ED. There has been no fever.  GAD Buspar as directed. Helps a lot with her anxiety.  * bursitis flare up in right hip- occurs about 2 times a year - started hurting about 1 month ago.  Review of Systems  Constitutional: Negative for malaise/fatigue.  Eyes: Negative for blurred vision.  Respiratory: Negative for shortness of breath.   Cardiovascular: Negative for chest pain.  Gastrointestinal: Negative for bowel incontinence.  Endocrine: Negative for polydipsia, polyphagia and polyuria.  Genitourinary: Negative for bladder incontinence.  Neurological: Positive for seizures. Negative for weakness and headaches.  All other systems reviewed and are negative.       Objective:   Physical Exam  Constitutional: She is oriented to  person, place, and time. She appears well-developed and well-nourished.  HENT:  Head:  Normocephalic.  Right Ear: Hearing, tympanic membrane, external ear and ear canal normal.  Left Ear: Hearing, tympanic membrane, external ear and ear canal normal.  Nose: Nose normal.  Mouth/Throat: Uvula is midline and oropharynx is clear and moist.  Eyes: Conjunctivae and EOM are normal. Pupils are equal, round, and reactive to light.  Neck: Trachea normal, normal range of motion and full passive range of motion without pain. Neck supple. No JVD present. Carotid bruit is not present. No mass and no thyromegaly present.  Cardiovascular: Normal rate, regular rhythm, normal heart sounds and intact distal pulses.  Exam reveals no gallop and no friction rub.   No murmur heard. Pulmonary/Chest: Effort normal and breath sounds normal. Right breast exhibits no inverted nipple, no mass, no nipple discharge, no skin change and no tenderness. Left breast exhibits no inverted nipple, no mass, no nipple discharge, no skin change and no tenderness.  Abdominal: Soft. Bowel sounds are normal. She exhibits no distension and no mass. There is no tenderness.  Genitourinary: Vagina normal and uterus normal. Guaiac positive stool. No breast swelling, tenderness, discharge or bleeding.  bimanual exam-No adnexal masses or tenderness. Cervix parous and pink no discharge  Musculoskeletal: Normal range of motion.  Lymphadenopathy:    She has no cervical adenopathy.  Neurological: She is alert and oriented to person, place, and time. She has normal reflexes.  See diabetic foot exam  Skin: Skin is warm and dry.  Psychiatric: She has a normal mood and affect. Her behavior is normal. Judgment and thought content normal.  BP 107/63  Pulse 72  Temp(Src) 96.6 F (35.9 C) (Oral)  Ht 5\' 2"  (1.575 m)  Wt 205 lb (92.987 kg)  BMI 37.49 kg/m2  LMP 08/05/2013       Assessment & Plan:    1. Annual physical exam   2. Encounter for  routine gynecological examination   3. Bursitis of hip, right   4. Occult blood in stools    Orders Placed This Encounter  Procedures  . Ambulatory referral to Gastroenterology    Referral Priority:  Routine    Referral Type:  Consultation    Referral Reason:  Specialty Services Required    Requested Specialty:  Gastroenterology    Number of Visits Requested:  1  . POCT urinalysis dipstick    Meds ordered this encounter  Medications  . methylPREDNISolone acetate (DEPO-MEDROL) injection 80 mg    Sig:   . carvedilol (COREG) 6.25 MG tablet    Sig: Take 1 tablet (6.25 mg total) by mouth 2 (two) times daily with a meal.    Dispense:  60 tablet    Refill:  5    Order Specific Question:  Supervising Provider    Answer:  Ernestina Penna [[id number]]  . metFORMIN (GLUCOPHAGE) 500 MG tablet    Sig: Take 1 tablet (500 mg total) by mouth 2 (two) times daily with a meal.    Dispense:  60 tablet    Refill:  5    Order Specific Question:  Supervising Provider    Answer:  Ernestina Penna [[id number]]  . carbamazepine (TEGRETOL XR) 400 MG 12 hr tablet    Sig: Take 1 tablet (400 mg total) by mouth 2 (two) times daily.    Dispense:  60 tablet    Refill:  5    Order Specific Question:  Supervising Provider    Answer:  Ernestina Penna [[id number]]  . simvastatin (ZOCOR) 40 MG tablet    Sig: Take  1 tablet (40 mg total) by mouth at bedtime.    Dispense:  30 tablet    Refill:  5    Order Specific Question:  Supervising Provider    Answer:  Ernestina Penna [[id number]]  . busPIRone (BUSPAR) 15 MG tablet    Sig: Take 1 tablet (15 mg total) by mouth 2 (two) times daily as needed. Every 12 hours as needed    Dispense:  60 tablet    Refill:  5    Order Specific Question:  Supervising Provider    Answer:  Ernestina Penna [[id number]]  . Diclofenac-Misoprostol (ARTHROTEC) 75-0.2 MG TBEC    Sig: Take 1 tablet by mouth 2 (two) times daily.    Dispense:  60 tablet    Refill:  2    Order Specific Question:  Supervising Provider     Answer:  Ernestina Penna [[id number]]  . lisinopril-hydrochlorothiazide (PRINZIDE,ZESTORETIC) 20-25 MG per tablet    Sig: Take 1 tablet by mouth daily.    Dispense:  90 tablet    Refill:  1    Order Specific Question:  Supervising Provider    Answer:  Ernestina Penna [[id number]]    Pap pending Continue all meds Labs pending Diet and exercise encouraged Health maintenance reviewed Follow up in 3 months  Mary-Margaret Daphine Deutscher, FNP

## 2013-09-08 LAB — PAP IG W/ RFLX HPV ASCU

## 2013-09-11 ENCOUNTER — Encounter: Payer: Self-pay | Admitting: Gastroenterology

## 2013-09-23 ENCOUNTER — Telehealth: Payer: Self-pay | Admitting: Nurse Practitioner

## 2013-10-13 ENCOUNTER — Ambulatory Visit: Payer: Self-pay | Admitting: Gastroenterology

## 2013-11-10 ENCOUNTER — Ambulatory Visit: Payer: BC Managed Care – PPO | Admitting: Nurse Practitioner

## 2013-11-14 ENCOUNTER — Ambulatory Visit: Payer: Self-pay | Admitting: Internal Medicine

## 2013-12-17 ENCOUNTER — Encounter: Payer: Self-pay | Admitting: Nurse Practitioner

## 2013-12-17 ENCOUNTER — Ambulatory Visit (INDEPENDENT_AMBULATORY_CARE_PROVIDER_SITE_OTHER): Payer: BC Managed Care – PPO | Admitting: Nurse Practitioner

## 2013-12-17 VITALS — BP 115/72 | HR 69 | Temp 97.2°F | Ht 62.0 in | Wt 204.0 lb

## 2013-12-17 DIAGNOSIS — F410 Panic disorder [episodic paroxysmal anxiety] without agoraphobia: Secondary | ICD-10-CM

## 2013-12-17 DIAGNOSIS — E785 Hyperlipidemia, unspecified: Secondary | ICD-10-CM

## 2013-12-17 DIAGNOSIS — K219 Gastro-esophageal reflux disease without esophagitis: Secondary | ICD-10-CM

## 2013-12-17 DIAGNOSIS — I10 Essential (primary) hypertension: Secondary | ICD-10-CM

## 2013-12-17 DIAGNOSIS — E119 Type 2 diabetes mellitus without complications: Secondary | ICD-10-CM

## 2013-12-17 DIAGNOSIS — R569 Unspecified convulsions: Secondary | ICD-10-CM

## 2013-12-17 LAB — POCT CBC
GRANULOCYTE PERCENT: 82.5 % — AB (ref 37–80)
HEMATOCRIT: 37.2 % — AB (ref 37.7–47.9)
HEMOGLOBIN: 12.8 g/dL (ref 12.2–16.2)
Lymph, poc: 1.6 (ref 0.6–3.4)
MCH, POC: 31.7 pg — AB (ref 27–31.2)
MCHC: 34.4 g/dL (ref 31.8–35.4)
MCV: 92.3 fL (ref 80–97)
MPV: 7.9 fL (ref 0–99.8)
POC Granulocyte: 8.4 — AB (ref 2–6.9)
POC LYMPH PERCENT: 15.7 %L (ref 10–50)
Platelet Count, POC: 276 10*3/uL (ref 142–424)
RBC: 4 M/uL — AB (ref 4.04–5.48)
RDW, POC: 11.6 %
WBC: 10.2 10*3/uL (ref 4.6–10.2)

## 2013-12-17 LAB — POCT GLYCOSYLATED HEMOGLOBIN (HGB A1C): Hemoglobin A1C: 6.3

## 2013-12-17 NOTE — Progress Notes (Signed)
Subjective:    Patient ID: Ellen Delgado, female    DOB: 1966/02/08, 48 y.o.   MRN: [id number]  Patient here today for follow up of multiple medical problems- no complaints today- no changes since last visit.  Hypertension This is a chronic problem. The current episode started more than 1 year ago. The problem is unchanged. The problem is controlled. Pertinent negatives include no blurred vision, chest pain, headaches, malaise/fatigue, peripheral edema or shortness of breath. There are no associated agents to hypertension. Risk factors for coronary artery disease include diabetes mellitus, dyslipidemia, obesity, post-menopausal state and sedentary lifestyle. Past treatments include ACE inhibitors, diuretics and beta blockers. The current treatment provides significant improvement. Compliance problems include exercise and diet.   Hyperlipidemia This is a chronic problem. The current episode started more than 1 year ago. The problem is uncontrolled. Recent lipid tests were reviewed and are high. Exacerbating diseases include diabetes and obesity. Pertinent negatives include no chest pain or shortness of breath. Current antihyperlipidemic treatment includes statins. The current treatment provides moderate improvement of lipids. Compliance problems include adherence to diet and adherence to exercise.  Risk factors for coronary artery disease include diabetes mellitus, hypertension, obesity and post-menopausal.  Diabetes She presents for her follow-up diabetic visit. She has type 2 diabetes mellitus. No MedicAlert identification noted. The initial diagnosis of diabetes was made 3 years ago. Her disease course has been fluctuating. Hypoglycemia symptoms include seizures. Pertinent negatives for hypoglycemia include no headaches. Pertinent negatives for diabetes include no blurred vision, no chest pain, no polydipsia, no polyphagia, no polyuria, no visual change and no weakness. There are no hypoglycemic  complications. Symptoms are stable. There are no diabetic complications. Risk factors for coronary artery disease include dyslipidemia, hypertension, obesity and post-menopausal. Current diabetic treatment includes oral agent (monotherapy). She is compliant with treatment all of the time. Her weight is stable. When asked about meal planning, she reported none. She has not had a previous visit with a dietician. She rarely participates in exercise. Her home blood glucose trend is fluctuating minimally. (Patient doesn't check blood sugars at home.) An ACE inhibitor/angiotensin II receptor blocker is being taken. She does not see a podiatrist.Eye exam is not current (2 years ago- Patient told to make appointment.).  Seizures  This is a chronic problem. Episode onset: last seizure was in 1990. The problem has not changed since onset.There was 1 seizure. The most recent episode lasted more than 5 minutes. Pertinent negatives include no headaches and no chest pain. Characteristics include eye blinking, eye deviation and rhythmic jerking. Characteristics do not include bowel incontinence or bladder incontinence. The episode was witnessed. There was no sensation of an aura present. The seizures did not continue in the ED. There has been no fever.  GAD Buspar as directed. Helps a lot with her anxiety.   Review of Systems  Constitutional: Negative for malaise/fatigue.  Eyes: Negative for blurred vision.  Respiratory: Negative for shortness of breath.   Cardiovascular: Negative for chest pain.  Gastrointestinal: Negative for bowel incontinence.  Endocrine: Negative for polydipsia, polyphagia and polyuria.  Genitourinary: Negative for bladder incontinence.  Neurological: Positive for seizures. Negative for weakness and headaches.  All other systems reviewed and are negative.       Objective:   Physical Exam  Constitutional: She is oriented to person, place, and time. She appears well-developed and  well-nourished.  HENT:  Nose: Nose normal.  Mouth/Throat: Oropharynx is clear and moist.  Eyes: EOM are normal.  Neck: Trachea normal,  normal range of motion and full passive range of motion without pain. Neck supple. No JVD present. Carotid bruit is not present. No thyromegaly present.  Cardiovascular: Normal rate, regular rhythm, normal heart sounds and intact distal pulses.  Exam reveals no gallop and no friction rub.   No murmur heard. Pulmonary/Chest: Effort normal and breath sounds normal.  Abdominal: Soft. Bowel sounds are normal. She exhibits no distension and no mass. There is no tenderness.  Musculoskeletal: Normal range of motion.  Lymphadenopathy:    She has no cervical adenopathy.  Neurological: She is alert and oriented to person, place, and time. She has normal reflexes.  See diabetic foot exam  Skin: Skin is warm and dry.  Psychiatric: She has a normal mood and affect. Her behavior is normal. Judgment and thought content normal.  BP 115/72  Pulse 69  Temp(Src) 97.2 F (36.2 C) (Oral)  Ht _0  (1.575 m)  Wt 204 lb (92.534 kg)  BMI 37.30 kg/m2   Results for orders placed in visit on 12/17/13  POCT GLYCOSYLATED HEMOGLOBIN (HGB A1C)      Result Value Range   Hemoglobin A1C 6.3%            Assessment & Plan:   1. HYPERTENSION   2. Diabetes   3. Hyperlipidemia   4. GERD   5. Panic attacks   6. Seizures    Orders Placed This Encounter  Procedures  . CMP14+EGFR  . NMR, lipoprofile  . Carbamazepine level, total  . POCT CBC  . POCT glycosylated hemoglobin (Hb A1C)   Refuses flu vaccine Continue all meds Labs pending Diet and exercise encouraged Health maintenance reviewed Follow up in 3 months  McBride, FNP

## 2013-12-17 NOTE — Patient Instructions (Signed)
Health Maintenance, Female A healthy lifestyle and preventative care can promote health and wellness.  Maintain regular health, dental, and eye exams.  Eat a healthy diet. Foods like vegetables, fruits, whole grains, low-fat dairy products, and lean protein foods contain the nutrients you need without too many calories. Decrease your intake of foods high in solid fats, added sugars, and salt. Get information about a proper diet from your caregiver, if necessary.  Regular physical exercise is one of the most important things you can do for your health. Most adults should get at least 150 minutes of moderate-intensity exercise (any activity that increases your heart rate and causes you to sweat) each week. In addition, most adults need muscle-strengthening exercises on 2 or more days a week.   Maintain a healthy weight. The body mass index (BMI) is a screening tool to identify possible weight problems. It provides an estimate of body fat based on height and weight. Your caregiver can help determine your BMI, and can help you achieve or maintain a healthy weight. For adults 20 years and older:  A BMI below 18.5 is considered underweight.  A BMI of 18.5 to 24.9 is normal.  A BMI of 25 to 29.9 is considered overweight.  A BMI of 30 and above is considered obese.  Maintain normal blood lipids and cholesterol by exercising and minimizing your intake of saturated fat. Eat a balanced diet with plenty of fruits and vegetables. Blood tests for lipids and cholesterol should begin at age 20 and be repeated every 5 years. If your lipid or cholesterol levels are high, you are over 50, or you are a high risk for heart disease, you may need your cholesterol levels checked more frequently.Ongoing high lipid and cholesterol levels should be treated with medicines if diet and exercise are not effective.  If you smoke, find out from your caregiver how to quit. If you do not use tobacco, do not start.  Lung  cancer screening is recommended for adults aged 55 80 years who are at high risk for developing lung cancer because of a history of smoking. Yearly low-dose computed tomography (CT) is recommended for people who have at least a 30-pack-year history of smoking and are a current smoker or have quit within the past 15 years. A pack year of smoking is smoking an average of 1 pack of cigarettes a day for 1 year (for example: 1 pack a day for 30 years or 2 packs a day for 15 years). Yearly screening should continue until the smoker has stopped smoking for at least 15 years. Yearly screening should also be stopped for people who develop a health problem that would prevent them from having lung cancer treatment.  If you are pregnant, do not drink alcohol. If you are breastfeeding, be very cautious about drinking alcohol. If you are not pregnant and choose to drink alcohol, do not exceed 1 drink per day. One drink is considered to be 12 ounces (355 mL) of beer, 5 ounces (148 mL) of wine, or 1.5 ounces (44 mL) of liquor.  Avoid use of street drugs. Do not share needles with anyone. Ask for help if you need support or instructions about stopping the use of drugs.  High blood pressure causes heart disease and increases the risk of stroke. Blood pressure should be checked at least every 1 to 2 years. Ongoing high blood pressure should be treated with medicines, if weight loss and exercise are not effective.  If you are 55 to   49 years old, ask your caregiver if you should take aspirin to prevent strokes.  Diabetes screening involves taking a blood sample to check your fasting blood sugar level. This should be done once every 3 years, after age 85, if you are within normal weight and without risk factors for diabetes. Testing should be considered at a younger age or be carried out more frequently if you are overweight and have at least 1 risk factor for diabetes.  Breast cancer screening is essential preventative care  for women. You should practice "breast self-awareness." This means understanding the normal appearance and feel of your breasts and may include breast self-examination. Any changes detected, no matter how small, should be reported to a caregiver. Women in their 66s and 30s should have a clinical breast exam (CBE) by a caregiver as part of a regular health exam every 1 to 3 years. After age 50, women should have a CBE every year. Starting at age 60, women should consider having a mammogram (breast X-ray) every year. Women who have a family history of breast cancer should talk to their caregiver about genetic screening. Women at a high risk of breast cancer should talk to their caregiver about having an MRI and a mammogram every year.  Breast cancer gene (BRCA)-related cancer risk assessment is recommended for women who have family members with BRCA-related cancers. BRCA-related cancers include breast, ovarian, tubal, and peritoneal cancers. Having family members with these cancers may be associated with an increased risk for harmful changes (mutations) in the breast cancer genes BRCA1 and BRCA2. Results of the assessment will determine the need for genetic counseling and BRCA1 and BRCA2 testing.  The Pap test is a screening test for cervical cancer. Women should have a Pap test starting at age 23. Between ages 10 and 74, Pap tests should be repeated every 2 years. Beginning at age 71, you should have a Pap test every 3 years as long as the past 3 Pap tests have been normal. If you had a hysterectomy for a problem that was not cancer or a condition that could lead to cancer, then you no longer need Pap tests. If you are between ages 13 and 44, and you have had normal Pap tests going back 10 years, you no longer need Pap tests. If you have had past treatment for cervical cancer or a condition that could lead to cancer, you need Pap tests and screening for cancer for at least 20 years after your treatment. If Pap  tests have been discontinued, risk factors (such as a new sexual partner) need to be reassessed to determine if screening should be resumed. Some women have medical problems that increase the chance of getting cervical cancer. In these cases, your caregiver may recommend more frequent screening and Pap tests.  The human papillomavirus (HPV) test is an additional test that may be used for cervical cancer screening. The HPV test looks for the virus that can cause the cell changes on the cervix. The cells collected during the Pap test can be tested for HPV. The HPV test could be used to screen women aged 109 years and older, and should be used in women of any age who have unclear Pap test results. After the age of 53, women should have HPV testing at the same frequency as a Pap test.  Colorectal cancer can be detected and often prevented. Most routine colorectal cancer screening begins at the age of 32 and continues through age 60. However, your caregiver  may recommend screening at an earlier age if you have risk factors for colon cancer. On a yearly basis, your caregiver may provide home test kits to check for hidden blood in the stool. Use of a small camera at the end of a tube, to directly examine the colon (sigmoidoscopy or colonoscopy), can detect the earliest forms of colorectal cancer. Talk to your caregiver about this at age 31, when routine screening begins. Direct examination of the colon should be repeated every 5 to 10 years through age 9, unless early forms of pre-cancerous polyps or small growths are found.  Hepatitis C blood testing is recommended for all people born from [birth year] through 1966 and any individual with known risks for hepatitis C.  Practice safe sex. Use condoms and avoid high-risk sexual practices to reduce the spread of sexually transmitted infections (STIs). Sexually active women aged 21 and younger should be checked for Chlamydia, which is a common sexually transmitted infection.  Older women with new or multiple partners should also be tested for Chlamydia. Testing for other STIs is recommended if you are sexually active and at increased risk.  Osteoporosis is a disease in which the bones lose minerals and strength with aging. This can result in serious bone fractures. The risk of osteoporosis can be identified using a bone density scan. Women ages 31 and over and women at risk for fractures or osteoporosis should discuss screening with their caregivers. Ask your caregiver whether you should be taking a calcium supplement or vitamin D to reduce the rate of osteoporosis.  Menopause can be associated with physical symptoms and risks. Hormone replacement therapy is available to decrease symptoms and risks. You should talk to your caregiver about whether hormone replacement therapy is right for you.  Use sunscreen. Apply sunscreen liberally and repeatedly throughout the day. You should seek shade when your shadow is shorter than you. Protect yourself by wearing long sleeves, pants, a wide-brimmed hat, and sunglasses year round, whenever you are outdoors.  Notify your caregiver of new moles or changes in moles, especially if there is a change in shape or color. Also notify your caregiver if a mole is larger than the size of a pencil eraser.  Stay current with your immunizations. Document Released: 06/12/2011 Document Revised: 03/24/2013 Document Reviewed: 06/12/2011 [facility] Patient Information 2014 [place].

## 2013-12-19 LAB — NMR, LIPOPROFILE
Cholesterol: 171 mg/dL (ref <200)
HDL CHOLESTEROL BY NMR: 47 mg/dL (ref >=40)
HDL Particle Number: 35.4 umol/L (ref >=30.5)
LDL PARTICLE NUMBER: 1312 nmol/L — AB (ref <1000)
LDL Size: 19.9 nm — ABNORMAL LOW (ref >20.5)
LDLC SERPL CALC-MCNC: 84 mg/dL (ref <100)
LP-IR Score: 62 — ABNORMAL HIGH (ref <=45)
Small LDL Particle Number: 951 nmol/L — ABNORMAL HIGH (ref <=527)
TRIGLYCERIDES BY NMR: 200 mg/dL — AB (ref <150)

## 2013-12-19 LAB — CMP14+EGFR
A/G RATIO: 1.6 (ref 1.1–2.5)
ALT: 15 IU/L (ref 0–32)
AST: 13 IU/L (ref 0–40)
Albumin: 3.9 g/dL (ref 3.5–5.5)
Alkaline Phosphatase: 60 IU/L (ref 39–117)
BUN/Creatinine Ratio: 21 (ref 9–23)
BUN: 18 mg/dL (ref 6–24)
CALCIUM: 9.3 mg/dL (ref 8.7–10.2)
CO2: 23 mmol/L (ref 18–29)
Chloride: 100 mmol/L (ref 97–108)
Creatinine, Ser: 0.87 mg/dL (ref 0.57–1.00)
GFR, EST AFRICAN AMERICAN: 91 mL/min/{1.73_m2} (ref >59)
GFR, EST NON AFRICAN AMERICAN: 79 mL/min/{1.73_m2} (ref >59)
GLUCOSE: 157 mg/dL — AB (ref 65–99)
Globulin, Total: 2.4 g/dL (ref 1.5–4.5)
Potassium: 5.1 mmol/L (ref 3.5–5.2)
Sodium: 137 mmol/L (ref 134–144)
TOTAL PROTEIN: 6.3 g/dL (ref 6.0–8.5)
Total Bilirubin: <0.2 mg/dL (ref 0.0–1.2)

## 2013-12-19 LAB — CARBAMAZEPINE LEVEL, TOTAL

## 2013-12-24 ENCOUNTER — Ambulatory Visit: Payer: Self-pay | Admitting: Internal Medicine

## 2013-12-29 ENCOUNTER — Other Ambulatory Visit: Payer: Self-pay | Admitting: *Deleted

## 2013-12-29 DIAGNOSIS — G8929 Other chronic pain: Secondary | ICD-10-CM

## 2013-12-29 DIAGNOSIS — M542 Cervicalgia: Principal | ICD-10-CM

## 2013-12-29 MED ORDER — DICLOFENAC-MISOPROSTOL 75-0.2 MG PO TBEC: 1.0000 | DELAYED_RELEASE_TABLET | Freq: Two times a day (BID) | ORAL | Status: DC

## 2014-01-01 ENCOUNTER — Other Ambulatory Visit: Payer: BC Managed Care – PPO

## 2014-01-01 DIAGNOSIS — G40909 Epilepsy, unspecified, not intractable, without status epilepticus: Secondary | ICD-10-CM

## 2014-01-02 ENCOUNTER — Encounter: Payer: Self-pay | Admitting: Gastroenterology

## 2014-01-02 LAB — CARBAMAZEPINE LEVEL, TOTAL: CARBAMAZEPINE LVL: 6.2 ug/mL (ref 4.0–12.0)

## 2014-01-09 ENCOUNTER — Ambulatory Visit: Payer: Self-pay | Admitting: Internal Medicine

## 2014-02-27 ENCOUNTER — Ambulatory Visit: Payer: Self-pay | Admitting: Gastroenterology

## 2014-03-18 ENCOUNTER — Other Ambulatory Visit: Payer: Self-pay | Admitting: *Deleted

## 2014-03-18 DIAGNOSIS — I10 Essential (primary) hypertension: Secondary | ICD-10-CM

## 2014-03-18 DIAGNOSIS — R569 Unspecified convulsions: Secondary | ICD-10-CM

## 2014-03-18 MED ORDER — CARBAMAZEPINE ER 400 MG PO TB12: 400.0000 mg | ORAL_TABLET | Freq: Two times a day (BID) | ORAL | Status: DC

## 2014-03-18 MED ORDER — CARVEDILOL 6.25 MG PO TABS: 6.2500 mg | ORAL_TABLET | Freq: Two times a day (BID) | ORAL | Status: DC

## 2014-03-18 NOTE — Telephone Encounter (Signed)
Last ov 1/15. 

## 2014-03-18 NOTE — Telephone Encounter (Signed)
rx ready for pickup 

## 2014-04-07 ENCOUNTER — Ambulatory Visit: Payer: BC Managed Care – PPO | Admitting: Nurse Practitioner

## 2014-04-15 ENCOUNTER — Ambulatory Visit: Payer: Self-pay | Admitting: Gastroenterology

## 2014-04-24 ENCOUNTER — Ambulatory Visit (INDEPENDENT_AMBULATORY_CARE_PROVIDER_SITE_OTHER): Payer: BC Managed Care – PPO | Admitting: Nurse Practitioner

## 2014-04-24 ENCOUNTER — Encounter: Payer: Self-pay | Admitting: Nurse Practitioner

## 2014-04-24 ENCOUNTER — Encounter (INDEPENDENT_AMBULATORY_CARE_PROVIDER_SITE_OTHER): Payer: Self-pay

## 2014-04-24 VITALS — BP 129/79 | HR 74 | Temp 98.3°F | Ht 62.0 in | Wt 203.8 lb

## 2014-04-24 DIAGNOSIS — F410 Panic disorder [episodic paroxysmal anxiety] without agoraphobia: Secondary | ICD-10-CM

## 2014-04-24 DIAGNOSIS — I10 Essential (primary) hypertension: Secondary | ICD-10-CM

## 2014-04-24 DIAGNOSIS — R569 Unspecified convulsions: Secondary | ICD-10-CM

## 2014-04-24 DIAGNOSIS — M542 Cervicalgia: Secondary | ICD-10-CM

## 2014-04-24 DIAGNOSIS — K219 Gastro-esophageal reflux disease without esophagitis: Secondary | ICD-10-CM

## 2014-04-24 DIAGNOSIS — E785 Hyperlipidemia, unspecified: Secondary | ICD-10-CM

## 2014-04-24 DIAGNOSIS — E119 Type 2 diabetes mellitus without complications: Secondary | ICD-10-CM

## 2014-04-24 DIAGNOSIS — G8929 Other chronic pain: Secondary | ICD-10-CM

## 2014-04-24 LAB — POCT GLYCOSYLATED HEMOGLOBIN (HGB A1C): Hemoglobin A1C: 6.7

## 2014-04-24 MED ORDER — METFORMIN HCL 500 MG PO TABS: 500.0000 mg | ORAL_TABLET | Freq: Two times a day (BID) | ORAL | Status: DC

## 2014-04-24 MED ORDER — CYCLOBENZAPRINE HCL 10 MG PO TABS: 10.0000 mg | ORAL_TABLET | Freq: Three times a day (TID) | ORAL | Status: DC | PRN

## 2014-04-24 MED ORDER — CARVEDILOL 6.25 MG PO TABS: 6.2500 mg | ORAL_TABLET | Freq: Two times a day (BID) | ORAL | Status: DC

## 2014-04-24 MED ORDER — CARBAMAZEPINE ER 400 MG PO TB12: 400.0000 mg | ORAL_TABLET | Freq: Two times a day (BID) | ORAL | Status: DC

## 2014-04-24 MED ORDER — DICLOFENAC-MISOPROSTOL 75-0.2 MG PO TBEC: 1.0000 | DELAYED_RELEASE_TABLET | Freq: Two times a day (BID) | ORAL | Status: DC

## 2014-04-24 MED ORDER — SIMVASTATIN 40 MG PO TABS: 40.0000 mg | ORAL_TABLET | Freq: Every day | ORAL | Status: DC

## 2014-04-24 NOTE — Addendum Note (Signed)
Addended by: Bennie PieriniMARTIN, MARY-MARGARET on: 04/24/2014 09:47 AM   Modules accepted: Orders

## 2014-04-24 NOTE — Patient Instructions (Signed)
Diabetes and Foot Care Diabetes may cause you to have problems because of poor blood supply (circulation) to your feet and legs. This may cause the skin on your feet to become thinner, break easier, and heal more slowly. Your skin may become dry, and the skin may peel and crack. You may also have nerve damage in your legs and feet causing decreased feeling in them. You may not notice minor injuries to your feet that could lead to infections or more serious problems. Taking care of your feet is one of the most important things you can do for yourself.  HOME CARE INSTRUCTIONS  Wear shoes at all times, even in the house. Do not go barefoot. Bare feet are easily injured.  Check your feet daily for blisters, cuts, and redness. If you cannot see the bottom of your feet, use a mirror or ask someone for help.  Wash your feet with warm water (do not use hot water) and mild soap. Then pat your feet and the areas between your toes until they are completely dry. Do not soak your feet as this can dry your skin.  Apply a moisturizing lotion or petroleum jelly (that does not contain alcohol and is unscented) to the skin on your feet and to dry, brittle toenails. Do not apply lotion between your toes.  Trim your toenails straight across. Do not dig under them or around the cuticle. File the edges of your nails with an emery board or nail file.  Do not cut corns or calluses or try to remove them with medicine.  Wear clean socks or stockings every day. Make sure they are not too tight. Do not wear knee-high stockings since they may decrease blood flow to your legs.  Wear shoes that fit properly and have enough cushioning. To break in new shoes, wear them for just a few hours a day. This prevents you from injuring your feet. Always look in your shoes before you put them on to be sure there are no objects inside.  Do not cross your legs. This may decrease the blood flow to your feet.  If you find a minor scrape,  cut, or break in the skin on your feet, keep it and the skin around it clean and dry. These areas may be cleansed with mild soap and water. Do not cleanse the area with peroxide, alcohol, or iodine.  When you remove an adhesive bandage, be sure not to damage the skin around it.  If you have a wound, look at it several times a day to make sure it is healing.  Do not use heating pads or hot water bottles. They may burn your skin. If you have lost feeling in your feet or legs, you may not know it is happening until it is too late.  Make sure your health care provider performs a complete foot exam at least annually or more often if you have foot problems. Report any cuts, sores, or bruises to your health care provider immediately. SEEK MEDICAL CARE IF:   You have an injury that is not healing.  You have cuts or breaks in the skin.  You have an ingrown nail.  You notice redness on your legs or feet.  You feel burning or tingling in your legs or feet.  You have pain or cramps in your legs and feet.  Your legs or feet are numb.  Your feet always feel cold. SEEK IMMEDIATE MEDICAL CARE IF:   There is increasing redness,   swelling, or pain in or around a wound.  There is a red line that goes up your leg.  Pus is coming from a wound.  You develop a fever or as directed by your health care provider.  You notice a bad smell coming from an ulcer or wound. Document Released: 11/24/2000 Document Revised: 07/30/2013 Document Reviewed: 05/06/2013 [employer] Patient Information 2014 [employer].  

## 2014-04-24 NOTE — Progress Notes (Signed)
Subjective:    Patient ID: Ellen Delgado, female    DOB: [birth year], 48 y.o.   MRN: [id number]  Patient here today for follow up of multiple medical problems- no complaints today- no changes since last visit.  Rectal Bleeding  Episode onset: suppose to see GI but keeps changing appointment. Pertinent negatives include no chest pain and no headaches.  Hypertension This is a chronic problem. The current episode started more than 1 year ago. The problem is unchanged. The problem is controlled. Pertinent negatives include no blurred vision, chest pain, headaches, malaise/fatigue, peripheral edema or shortness of breath. There are no associated agents to hypertension. Risk factors for coronary artery disease include diabetes mellitus, dyslipidemia, obesity, post-menopausal state and sedentary lifestyle. Past treatments include ACE inhibitors, diuretics and beta blockers. The current treatment provides significant improvement. Compliance problems include exercise and diet.   Hyperlipidemia This is a chronic problem. The current episode started more than 1 year ago. The problem is uncontrolled. Recent lipid tests were reviewed and are high. Exacerbating diseases include diabetes and obesity. Pertinent negatives include no chest pain or shortness of breath. Current antihyperlipidemic treatment includes statins. The current treatment provides moderate improvement of lipids. Compliance problems include adherence to diet and adherence to exercise.  Risk factors for coronary artery disease include diabetes mellitus, hypertension, obesity and post-menopausal.  Diabetes She presents for her follow-up diabetic visit. She has type 2 diabetes mellitus. No MedicAlert identification noted. The initial diagnosis of diabetes was made 3 years ago. Her disease course has been fluctuating. Hypoglycemia symptoms include seizures. Pertinent negatives for hypoglycemia include no headaches. Pertinent negatives for diabetes  include no blurred vision, no chest pain, no polydipsia, no polyphagia, no polyuria, no visual change and no weakness. There are no hypoglycemic complications. Symptoms are stable. There are no diabetic complications. Risk factors for coronary artery disease include dyslipidemia, hypertension, obesity and post-menopausal. Current diabetic treatment includes oral agent (monotherapy). She is compliant with treatment all of the time. Her weight is stable. When asked about meal planning, she reported none. She has not had a previous visit with a dietician. She rarely participates in exercise. Her home blood glucose trend is fluctuating minimally. (Patient doesn't check blood sugars at home.) An ACE inhibitor/angiotensin II receptor blocker is being taken. She does not see a podiatrist.Eye exam is not current (2 years ago- Patient told to make appointment.).  Seizures  This is a chronic problem. Episode onset: last seizure was in 1990. The problem has not changed since onset.There was 1 seizure. The most recent episode lasted more than 5 minutes. Pertinent negatives include no headaches and no chest pain. Characteristics include eye blinking, eye deviation and rhythmic jerking. Characteristics do not include bowel incontinence or bladder incontinence. The episode was witnessed. There was no sensation of an aura present. The seizures did not continue in the ED. There has been no fever.  GAD Buspar as directed. Helps a lot with her anxiety. Chronic back pain Uses flexeril 2 x a day and needs refill of meds   Review of Systems  Constitutional: Negative for malaise/fatigue.  Eyes: Negative for blurred vision.  Respiratory: Negative for shortness of breath.   Cardiovascular: Negative for chest pain.  Gastrointestinal: Positive for hematochezia. Negative for bowel incontinence.  Endocrine: Negative for polydipsia, polyphagia and polyuria.  Genitourinary: Negative for bladder incontinence.  Neurological:  Positive for seizures. Negative for weakness and headaches.  All other systems reviewed and are negative.      Objective:  Physical Exam  Constitutional: She is oriented to person, place, and time. She appears well-developed and well-nourished.  HENT:  Nose: Nose normal.  Mouth/Throat: Oropharynx is clear and moist.  Eyes: EOM are normal.  Neck: Trachea normal, normal range of motion and full passive range of motion without pain. Neck supple. No JVD present. Carotid bruit is not present. No thyromegaly present.  Cardiovascular: Normal rate, regular rhythm, normal heart sounds and intact distal pulses.  Exam reveals no gallop and no friction rub.   No murmur heard. Pulmonary/Chest: Effort normal and breath sounds normal.  Abdominal: Soft. Bowel sounds are normal. She exhibits no distension and no mass. There is no tenderness.  Musculoskeletal: Normal range of motion.  Lymphadenopathy:    She has no cervical adenopathy.  Neurological: She is alert and oriented to person, place, and time. She has normal reflexes.  See diabetic foot exam  Skin: Skin is warm and dry.  Psychiatric: She has a normal mood and affect. Her behavior is normal. Judgment and thought content normal.  BP 129/79  Pulse 74  Temp(Src) 98.3 F (36.8 C) (Oral)  Ht 5' 2" (1.575 m)  Wt 203 lb 12.8 oz (92.443 kg)  BMI 37.27 kg/m2   Results for orders placed in visit on 04/24/14  POCT GLYCOSYLATED HEMOGLOBIN (HGB A1C)      Result Value Ref Range   Hemoglobin A1C 6.7%            Assessment & Plan:   1. HYPERTENSION   2. Hyperlipidemia   3. Diabetes   4. GERD   5. Panic attacks   6. Seizures   7. Chronic neck pain   8. Hypertension    Orders Placed This Encounter  Procedures  . CMP14+EGFR  . NMR, lipoprofile  . POCT glycosylated hemoglobin (Hb A1C)   Meds ordered this encounter  Medications  . cyclobenzaprine (FLEXERIL) 10 MG tablet    Sig: Take 1 tablet (10 mg total) by mouth 3 (three) times  daily as needed for muscle spasms.    Dispense:  30 tablet    Refill:  2    Order Specific Question:  Supervising Provider    Answer:  Chipper Herb [[id number]]  . simvastatin (ZOCOR) 40 MG tablet    Sig: Take 1 tablet (40 mg total) by mouth at bedtime.    Dispense:  30 tablet    Refill:  5    Order Specific Question:  Supervising Provider    Answer:  Chipper Herb [[id number]]  . metFORMIN (GLUCOPHAGE) 500 MG tablet    Sig: Take 1 tablet (500 mg total) by mouth 2 (two) times daily with a meal.    Dispense:  60 tablet    Refill:  5    Order Specific Question:  Supervising Provider    Answer:  Chipper Herb [[id number]]  . carbamazepine (TEGRETOL XR) 400 MG 12 hr tablet    Sig: Take 1 tablet (400 mg total) by mouth 2 (two) times daily.    Dispense:  60 tablet    Refill:  3    Order Specific Question:  Supervising Provider    Answer:  Chipper Herb [[id number]]  . carvedilol (COREG) 6.25 MG tablet    Sig: Take 1 tablet (6.25 mg total) by mouth 2 (two) times daily with a meal.    Dispense:  60 tablet    Refill:  3    Order Specific Question:  Supervising Provider    Answer:  Redge Gainer  W [[id number]]  . Diclofenac-Misoprostol (ARTHROTEC) 75-0.2 MG TBEC    Sig: Take 1 tablet by mouth 2 (two) times daily.    Dispense:  60 tablet    Refill:  2    Order Specific Question:  Supervising Provider    Answer:  Chipper Herb [[id number]]   hemoccult cards given to patient with directions Labs pending Health maintenance reviewed Diet and exercise encouraged Continue all meds Follow up  In 3 months   [place], FNP

## 2014-04-25 LAB — CMP14+EGFR
ALT: 18 IU/L (ref 0–32)
AST: 16 IU/L (ref 0–40)
Albumin/Globulin Ratio: 1.7 (ref 1.1–2.5)
Albumin: 3.9 g/dL (ref 3.5–5.5)
Alkaline Phosphatase: 66 IU/L (ref 39–117)
BUN / CREAT RATIO: 26 — AB (ref 9–23)
BUN: 22 mg/dL (ref 6–24)
CO2: 25 mmol/L (ref 18–29)
CREATININE: 0.85 mg/dL (ref 0.57–1.00)
Calcium: 8.8 mg/dL (ref 8.7–10.2)
Chloride: 99 mmol/L (ref 97–108)
GFR, EST AFRICAN AMERICAN: 94 mL/min/{1.73_m2} (ref >59)
GFR, EST NON AFRICAN AMERICAN: 81 mL/min/{1.73_m2} (ref >59)
GLOBULIN, TOTAL: 2.3 g/dL (ref 1.5–4.5)
Glucose: 138 mg/dL — ABNORMAL HIGH (ref 65–99)
POTASSIUM: 4.5 mmol/L (ref 3.5–5.2)
SODIUM: 138 mmol/L (ref 134–144)
Total Protein: 6.2 g/dL (ref 6.0–8.5)

## 2014-04-25 LAB — NMR, LIPOPROFILE
Cholesterol: 158 mg/dL (ref 100–199)
HDL CHOLESTEROL BY NMR: 47 mg/dL (ref >39)
HDL Particle Number: 34.7 umol/L (ref >=30.5)
LDL Particle Number: 1089 nmol/L — ABNORMAL HIGH (ref <1000)
LDL SIZE: 20.2 nm (ref >20.5)
LDLC SERPL CALC-MCNC: 67 mg/dL (ref 0–99)
LP-IR Score: 64 — ABNORMAL HIGH (ref <=45)
Small LDL Particle Number: 617 nmol/L — ABNORMAL HIGH (ref <=527)
Triglycerides by NMR: 218 mg/dL — ABNORMAL HIGH (ref 0–149)

## 2014-05-25 ENCOUNTER — Telehealth: Payer: Self-pay | Admitting: Nurse Practitioner

## 2014-05-27 ENCOUNTER — Ambulatory Visit (INDEPENDENT_AMBULATORY_CARE_PROVIDER_SITE_OTHER): Payer: BC Managed Care – PPO | Admitting: Nurse Practitioner

## 2014-05-27 ENCOUNTER — Ambulatory Visit (INDEPENDENT_AMBULATORY_CARE_PROVIDER_SITE_OTHER): Payer: BC Managed Care – PPO

## 2014-05-27 ENCOUNTER — Encounter: Payer: Self-pay | Admitting: Nurse Practitioner

## 2014-05-27 VITALS — BP 140/80 | HR 82 | Temp 97.2°F | Ht 62.0 in | Wt 203.6 lb

## 2014-05-27 DIAGNOSIS — M79609 Pain in unspecified limb: Secondary | ICD-10-CM

## 2014-05-27 DIAGNOSIS — M79671 Pain in right foot: Secondary | ICD-10-CM

## 2014-05-27 DIAGNOSIS — M7730 Calcaneal spur, unspecified foot: Secondary | ICD-10-CM

## 2014-05-27 MED ORDER — PREDNISONE 20 MG PO TABS: ORAL_TABLET | ORAL | Status: DC

## 2014-05-27 NOTE — Patient Instructions (Signed)
Heel Spur A heel spur is a hook of bone that can form on the calcaneus (the heel bone and the largest bone of the foot). Heel spurs are often associated with plantar fasciitis and usually come in people who have had the problem for an extended period of time. The cause of the relationship is unknown. The pain associated with them is thought to be caused by an inflammation (soreness and redness) of the plantar fascia rather than the spur itself. The plantar fascia is a thick fibrous like tissue that runs from the calcaneus (heel bone) to the ball of the foot. This strong, tight tissue helps maintain the arch of your foot. It helps distribute the weight across your foot as you walk or run. Stresses placed on the plantar fascia can be tremendous. When it is inflamed normal activities become painful. Pain is worse in the morning after sleeping. After sleeping the plantar fascia is tight. The first movements stretch the fascia and this causes pain. As the tendon loosens, the pain usually gets better. It often returns with too much standing or walking.  About 70% of patients with plantar fasciitis have a heel spur. About half of people without foot pain also have heel spurs. DIAGNOSIS  The diagnosis of a heel spur is made by X-ray. The X-ray shows a hook of bone protruding from the bottom of the calcaneus at the point where the plantar fascia is attached to the heel bone.  TREATMENT  It is necessary to find out what is causing the stretching of the plantar fascia. If the cause is over-pronation (flat feet), orthotics and proper foot ware may help.  Stretching exercises, losing weight, wearing shoes that have a cushioned heel that absorbs shock, and elevating the heel with the use of a heel cradle, heel cup, or orthotics may all help. Heel cradles and heel cups provide extra comfort and cushion to the heel, and reduce the amount of shock to the sore area. AVOIDING THE PAIN OF PLANTAR FASCIITIS AND HEEL  SPURS  Consult a sports medicine professional before beginning a new exercise program.  Walking programs offer a good workout. There is a lower chance of overuse injuries common to the runners. There is less impact and less jarring of the joints.  Begin all new exercise programs slowly. If problems or pains develop, decrease the amount of time or distance until you are at a comfortable level.  Wear good shoes and replace them regularly.  Stretch your foot and the heel cords at the back of the ankle (Achilles tendons) both before and after exercise.  Run or exercise on even surfaces that are not hard. For example, asphalt is better than pavement.  Do not run barefoot on hard surfaces.  If using a treadmill, vary the incline.  Do not continue to workout if you have foot or joint problems. Seek professional help if they do not improve. HOME CARE INSTRUCTIONS   Avoid activities that cause you pain until you recover.  Use ice or cold packs to the problem or painful areas after working out.  Only take over-the-counter or prescription medicines for pain, discomfort, or fever as directed by your caregiver.  Soft shoe inserts or athletic shoes with air or gel sole cushions may be helpful.  If problems continue or become more severe, consult a sports medicine caregiver. Cortisone is a potent anti-inflammatory medication that may be injected into the painful area. You can discuss this treatment with your caregiver. MAKE SURE YOU:     Understand these instructions.  Will watch your condition.  Will get help right away if you are not doing well or get worse. Document Released: 01/03/2006 Document Revised: 02/19/2012 Document Reviewed: 03/07/2006 ExitCare Patient Information 2015 [employer]. This information is not intended to replace advice given to you by your health care Evany Schecter. Make sure you discuss any questions you have with your health care Nilo Fallin.  

## 2014-05-27 NOTE — Progress Notes (Addendum)
   Subjective:    Patient ID: Ellen Delgado, female    DOB: 10-16-1966, 48 y.o.   MRN: [id number]  HPI Patient in c/o right foot pain that started 2 weeks ago- does not remember any injury- said that back of heel just started burning and hurting and now ankle is hurting. Rates pain now 7-8/10- nothing helps pain- standing increases pain. ON arthrotec which does not help.    Review of Systems  Constitutional: Negative.   HENT: Negative.   Respiratory: Negative.   Cardiovascular: Negative.   Psychiatric/Behavioral: Negative.   All other systems reviewed and are negative.      Objective:   Physical Exam  Constitutional: She is oriented to person, place, and time. She appears well-developed and well-nourished.  Cardiovascular: Normal rate and normal heart sounds.   Pulmonary/Chest: Effort normal and breath sounds normal.  Musculoskeletal:  No right ankle edema or foot edema- slight pain on palpation lateral side of ankle- FROM of ankle with pain on movement in any direction.  Neurological: She is alert and oriented to person, place, and time.  Skin: Skin is warm and dry.  Psychiatric: She has a normal mood and affect. Her behavior is normal. Judgment and thought content normal.   BP 140/80  Pulse 82  Temp(Src) 97.2 F (36.2 C) (Oral)  Ht 5\' 2"  (1.575 m)  Wt 203 lb 9.6 oz (92.352 kg)  BMI 37.23 kg/m2  Right foot x ray- heel spur post heel and dorsal surface of heel-Preliminary reading by Paulene FloorMary Martin, FNP  [facility]  Rest     Assessment & Plan:   1. Right foot pain   2. Heel spur    Meds ordered this encounter  Medications  . predniSONE (DELTASONE) 20 MG tablet    Sig: 2 po qd X5 days    Dispense:  10 tablet    Refill:  0    Order Specific Question:  Supervising Thamas Appleyard    Answer:  Ernestina PennaMOORE, DONALD W [[id number]]   Rest  stay off foot as much as possible RTO prn  Mary-Margaret Daphine DeutscherMartin, FNP

## 2014-05-28 LAB — ARTHRITIS PANEL
Basophils Absolute: 0 10*3/uL (ref 0.0–0.2)
Basos: 0 %
EOS ABS: 0.3 10*3/uL (ref 0.0–0.4)
Eos: 4 %
HCT: 37 % (ref 34.0–46.6)
HEMOGLOBIN: 12.2 g/dL (ref 11.1–15.9)
IMMATURE GRANS (ABS): 0 10*3/uL (ref 0.0–0.1)
IMMATURE GRANULOCYTES: 0 %
Lymphocytes Absolute: 2 10*3/uL (ref 0.7–3.1)
Lymphs: 24 %
MCH: 31.1 pg (ref 26.6–33.0)
MCHC: 33 g/dL (ref 31.5–35.7)
MCV: 94 fL (ref 79–97)
MONOS ABS: 0.6 10*3/uL (ref 0.1–0.9)
Monocytes: 8 %
NEUTROS PCT: 64 %
Neutrophils Absolute: 5.3 10*3/uL (ref 1.4–7.0)
Platelets: 284 10*3/uL (ref 150–379)
RBC: 3.92 x10E6/uL (ref 3.77–5.28)
RDW: 12.6 % (ref 12.3–15.4)
RHEUMATOID FACTOR: 7.7 [IU]/mL (ref 0.0–13.9)
SED RATE: 7 mm/h (ref 0–32)
URIC ACID: 5.6 mg/dL (ref 2.5–7.1)
WBC: 8.3 10*3/uL (ref 3.4–10.8)

## 2014-06-01 ENCOUNTER — Telehealth: Payer: Self-pay | Admitting: Nurse Practitioner

## 2014-06-01 NOTE — Telephone Encounter (Signed)
Sh egot shot and prednisone at visit- need to wait and see if helps- too soon for refill

## 2014-06-02 ENCOUNTER — Ambulatory Visit: Payer: BC Managed Care – PPO | Admitting: Physician Assistant

## 2014-06-02 NOTE — Telephone Encounter (Signed)
Patient did not get a shot she got an Rx for prednisone told her to wait a couple days and if it is no better to give us a call back

## 2014-06-04 ENCOUNTER — Telehealth: Payer: Self-pay | Admitting: Nurse Practitioner

## 2014-06-04 NOTE — Telephone Encounter (Signed)
Reviewed last office note. Patient will need to be seen if pain persists.  Left message to return call to triage to schedule appt.

## 2014-06-08 ENCOUNTER — Encounter: Payer: Self-pay | Admitting: Gastroenterology

## 2014-06-19 ENCOUNTER — Ambulatory Visit: Payer: Self-pay | Admitting: Gastroenterology

## 2014-06-23 ENCOUNTER — Other Ambulatory Visit: Payer: Self-pay | Admitting: *Deleted

## 2014-06-23 MED ORDER — LISINOPRIL-HYDROCHLOROTHIAZIDE 20-25 MG PO TABS: 1.0000 | ORAL_TABLET | Freq: Every day | ORAL | Status: DC

## 2014-06-26 ENCOUNTER — Other Ambulatory Visit: Payer: Self-pay | Admitting: *Deleted

## 2014-06-26 MED ORDER — LISINOPRIL-HYDROCHLOROTHIAZIDE 20-25 MG PO TABS: 1.0000 | ORAL_TABLET | Freq: Every day | ORAL | Status: DC

## 2014-07-03 LAB — HM DIABETES EYE EXAM

## 2014-07-27 ENCOUNTER — Ambulatory Visit: Payer: BC Managed Care – PPO | Admitting: Nurse Practitioner

## 2014-07-29 ENCOUNTER — Encounter: Payer: Self-pay | Admitting: Nurse Practitioner

## 2014-07-29 ENCOUNTER — Ambulatory Visit (INDEPENDENT_AMBULATORY_CARE_PROVIDER_SITE_OTHER): Payer: BC Managed Care – PPO | Admitting: Nurse Practitioner

## 2014-07-29 VITALS — BP 106/62 | Temp 97.1°F | Ht 62.0 in | Wt 200.0 lb

## 2014-07-29 DIAGNOSIS — K219 Gastro-esophageal reflux disease without esophagitis: Secondary | ICD-10-CM

## 2014-07-29 DIAGNOSIS — Z713 Dietary counseling and surveillance: Secondary | ICD-10-CM

## 2014-07-29 DIAGNOSIS — M542 Cervicalgia: Secondary | ICD-10-CM

## 2014-07-29 DIAGNOSIS — R569 Unspecified convulsions: Secondary | ICD-10-CM

## 2014-07-29 DIAGNOSIS — F410 Panic disorder [episodic paroxysmal anxiety] without agoraphobia: Secondary | ICD-10-CM

## 2014-07-29 DIAGNOSIS — E785 Hyperlipidemia, unspecified: Secondary | ICD-10-CM

## 2014-07-29 DIAGNOSIS — I10 Essential (primary) hypertension: Secondary | ICD-10-CM

## 2014-07-29 DIAGNOSIS — Z6836 Body mass index (BMI) 36.0-36.9, adult: Secondary | ICD-10-CM

## 2014-07-29 DIAGNOSIS — G8929 Other chronic pain: Secondary | ICD-10-CM

## 2014-07-29 DIAGNOSIS — E119 Type 2 diabetes mellitus without complications: Secondary | ICD-10-CM

## 2014-07-29 LAB — POCT GLYCOSYLATED HEMOGLOBIN (HGB A1C): Hemoglobin A1C: 6.4

## 2014-07-29 MED ORDER — SIMVASTATIN 40 MG PO TABS: 40.0000 mg | ORAL_TABLET | Freq: Every day | ORAL | Status: DC

## 2014-07-29 MED ORDER — CARVEDILOL 6.25 MG PO TABS: 6.2500 mg | ORAL_TABLET | Freq: Two times a day (BID) | ORAL | Status: DC

## 2014-07-29 MED ORDER — METFORMIN HCL 500 MG PO TABS: 500.0000 mg | ORAL_TABLET | Freq: Two times a day (BID) | ORAL | Status: DC

## 2014-07-29 MED ORDER — CARBAMAZEPINE ER 400 MG PO TB12: 400.0000 mg | ORAL_TABLET | Freq: Two times a day (BID) | ORAL | Status: DC

## 2014-07-29 MED ORDER — DICLOFENAC-MISOPROSTOL 75-0.2 MG PO TBEC: 1.0000 | DELAYED_RELEASE_TABLET | Freq: Two times a day (BID) | ORAL | Status: DC

## 2014-07-29 NOTE — Progress Notes (Signed)
Subjective:    Patient ID: Ellen Delgado, female    DOB: May 16, 1966, 48 y.o.   MRN: [id number]  Patient here today for follow up of multiple medical problems- no complaints today- no changes since last visit. She did c/o rectal bleeding at last visit. Just resolved  On its own- Needs colonoscopy but can't afford to do right now.  Hypertension This is a chronic problem. The current episode started more than 1 year ago. The problem is unchanged. The problem is controlled. Pertinent negatives include no blurred vision, malaise/fatigue, peripheral edema or shortness of breath. There are no associated agents to hypertension. Risk factors for coronary artery disease include diabetes mellitus, dyslipidemia, obesity, post-menopausal state and sedentary lifestyle. Past treatments include ACE inhibitors, diuretics and beta blockers. The current treatment provides significant improvement. Compliance problems include exercise and diet.   Hyperlipidemia This is a chronic problem. The current episode started more than 1 year ago. The problem is uncontrolled. Recent lipid tests were reviewed and are high. Exacerbating diseases include diabetes and obesity. Pertinent negatives include no shortness of breath. Current antihyperlipidemic treatment includes statins. The current treatment provides moderate improvement of lipids. Compliance problems include adherence to diet and adherence to exercise.  Risk factors for coronary artery disease include diabetes mellitus, hypertension, obesity and post-menopausal.  Diabetes She presents for her follow-up diabetic visit. She has type 2 diabetes mellitus. No MedicAlert identification noted. The initial diagnosis of diabetes was made 3 years ago. Her disease course has been fluctuating. Hypoglycemia symptoms include seizures. Pertinent negatives for diabetes include no blurred vision, no polydipsia, no polyphagia, no polyuria, no visual change and no weakness. There are no  hypoglycemic complications. Symptoms are stable. There are no diabetic complications. Risk factors for coronary artery disease include dyslipidemia, hypertension, obesity and post-menopausal. Current diabetic treatment includes oral agent (monotherapy). She is compliant with treatment all of the time. Her weight is stable. When asked about meal planning, she reported none. She has not had a previous visit with a dietician. She rarely participates in exercise. Her home blood glucose trend is fluctuating minimally. (Patient doesn't check blood sugars at home.) An ACE inhibitor/angiotensin II receptor blocker is being taken. She does not see a podiatrist.Eye exam is not current (2 years ago- Patient told to make appointment.).  Seizures  This is a chronic problem. Episode onset: last seizure was in 1990. The problem has not changed since onset.There was 1 seizure. The most recent episode lasted more than 5 minutes. Characteristics include eye blinking, eye deviation and rhythmic jerking. Characteristics do not include bowel incontinence or bladder incontinence. The episode was witnessed. There was no sensation of an aura present. The seizures did not continue in the ED. There has been no fever.  GAD/ Panic attacks Buspar as directed. Helps a lot with her anxiety. Chronic back pain Uses flexeril 2 x a day and needs refill of meds   Review of Systems  Constitutional: Negative for malaise/fatigue.  Eyes: Negative for blurred vision.  Respiratory: Negative for shortness of breath.   Gastrointestinal: Negative for bowel incontinence.  Endocrine: Negative for polydipsia, polyphagia and polyuria.  Genitourinary: Negative for bladder incontinence.  Neurological: Positive for seizures. Negative for weakness.  All other systems reviewed and are negative.      Objective:   Physical Exam  Constitutional: She is oriented to person, place, and time. She appears well-developed and well-nourished.  HENT:   Nose: Nose normal.  Mouth/Throat: Oropharynx is clear and moist.  Eyes: EOM are  normal.  Neck: Trachea normal, normal range of motion and full passive range of motion without pain. Neck supple. No JVD present. Carotid bruit is not present. No thyromegaly present.  Cardiovascular: Normal rate, regular rhythm, normal heart sounds and intact distal pulses.  Exam reveals no gallop and no friction rub.   No murmur heard. Pulmonary/Chest: Effort normal and breath sounds normal.  Abdominal: Soft. Bowel sounds are normal. She exhibits no distension and no mass. There is no tenderness.  Musculoskeletal: Normal range of motion.  Lymphadenopathy:    She has no cervical adenopathy.  Neurological: She is alert and oriented to person, place, and time. She has normal reflexes.  See diabetic foot exam  Skin: Skin is warm and dry.  Psychiatric: She has a normal mood and affect. Her behavior is normal. Judgment and thought content normal.  BP 106/62  Temp(Src) 97.1 F (36.2 C) (Oral)  Ht $R'5\' 2"'[place]$  (1.575 m)  Wt 200 lb (90.719 kg)  BMI 36.57 kg/m2   Results for orders placed in visit on 07/29/14  POCT GLYCOSYLATED HEMOGLOBIN (HGB A1C)      Result Value Ref Range   Hemoglobin A1C 6.4            Assessment & Plan:   1. Type 2 diabetes mellitus without complication   2. HYPERTENSION   3. Hyperlipidemia   4. Gastroesophageal reflux disease without esophagitis   5. Panic attacks   6. Seizures   7. Chronic neck pain   8. Essential hypertension   9. BMI 36.0-36.9,adult   10. Weight loss counseling, encounter for    Orders Placed This Encounter  Procedures  . CMP14+EGFR  . NMR, lipoprofile  . POCT glycosylated hemoglobin (Hb A1C)   Meds ordered this encounter  Medications  . Diclofenac-Misoprostol (ARTHROTEC) 75-0.2 MG TBEC    Sig: Take 1 tablet by mouth 2 (two) times daily.    Dispense:  60 tablet    Refill:  2    Order Specific Question:  Supervising Provider    Answer:  Chipper Herb [[id number]]  . metFORMIN (GLUCOPHAGE) 500 MG tablet    Sig: Take 1 tablet (500 mg total) by mouth 2 (two) times daily with a meal.    Dispense:  60 tablet    Refill:  5    Order Specific Question:  Supervising Provider    Answer:  Chipper Herb [[id number]]  . carbamazepine (TEGRETOL XR) 400 MG 12 hr tablet    Sig: Take 1 tablet (400 mg total) by mouth 2 (two) times daily.    Dispense:  60 tablet    Refill:  3    Order Specific Question:  Supervising Provider    Answer:  Chipper Herb [[id number]]  . carvedilol (COREG) 6.25 MG tablet    Sig: Take 1 tablet (6.25 mg total) by mouth 2 (two) times daily with a meal.    Dispense:  60 tablet    Refill:  3    Order Specific Question:  Supervising Provider    Answer:  Chipper Herb [[id number]]  . simvastatin (ZOCOR) 40 MG tablet    Sig: Take 1 tablet (40 mg total) by mouth at bedtime.    Dispense:  30 tablet    Refill:  5    Order Specific Question:  Supervising Provider    Answer:  Chipper Herb [[id number]]    Labs pending Health maintenance reviewed Diet and exercise encouraged Continue all meds Follow up  In 3 months  Mary-Margaret Hassell Done, FNP

## 2014-07-29 NOTE — Patient Instructions (Signed)

## 2014-07-30 LAB — CMP14+EGFR
ALK PHOS: 57 IU/L (ref 39–117)
ALT: 12 IU/L (ref 0–32)
AST: 10 IU/L (ref 0–40)
Albumin/Globulin Ratio: 1.4 (ref 1.1–2.5)
Albumin: 3.8 g/dL (ref 3.5–5.5)
BILIRUBIN TOTAL: 0.2 mg/dL (ref 0.0–1.2)
BUN / CREAT RATIO: 22 (ref 9–23)
BUN: 18 mg/dL (ref 6–24)
CO2: 23 mmol/L (ref 18–29)
Calcium: 9.2 mg/dL (ref 8.7–10.2)
Chloride: 98 mmol/L (ref 97–108)
Creatinine, Ser: 0.83 mg/dL (ref 0.57–1.00)
GFR calc non Af Amer: 84 mL/min/{1.73_m2} (ref >59)
GFR, EST AFRICAN AMERICAN: 96 mL/min/{1.73_m2} (ref >59)
Globulin, Total: 2.7 g/dL (ref 1.5–4.5)
Glucose: 121 mg/dL — ABNORMAL HIGH (ref 65–99)
Potassium: 4.9 mmol/L (ref 3.5–5.2)
Sodium: 136 mmol/L (ref 134–144)
Total Protein: 6.5 g/dL (ref 6.0–8.5)

## 2014-07-30 LAB — NMR, LIPOPROFILE
CHOLESTEROL: 164 mg/dL (ref 100–199)
HDL Cholesterol by NMR: 44 mg/dL (ref >39)
HDL PARTICLE NUMBER: 29.8 umol/L — AB (ref >=30.5)
LDL PARTICLE NUMBER: 1030 nmol/L — AB (ref <1000)
LDL SIZE: 20.2 nm (ref >20.5)
LDLC SERPL CALC-MCNC: 81 mg/dL (ref 0–99)
LP-IR Score: 60 — ABNORMAL HIGH (ref <=45)
Small LDL Particle Number: 631 nmol/L — ABNORMAL HIGH (ref <=527)
Triglycerides by NMR: 196 mg/dL — ABNORMAL HIGH (ref 0–149)

## 2014-08-21 ENCOUNTER — Ambulatory Visit (INDEPENDENT_AMBULATORY_CARE_PROVIDER_SITE_OTHER): Payer: Self-pay | Admitting: Internal Medicine

## 2014-09-22 NOTE — Progress Notes (Signed)
Patient ID: Ellen Delgado, female   DOB: 09/23/1966, 48 y.o.   MRN: [id number] Patient canceled this office visit and was not seen in the office

## 2014-09-23 ENCOUNTER — Other Ambulatory Visit: Payer: Self-pay | Admitting: Nurse Practitioner

## 2014-09-24 ENCOUNTER — Telehealth: Payer: Self-pay | Admitting: Nurse Practitioner

## 2014-09-25 ENCOUNTER — Other Ambulatory Visit: Payer: Self-pay | Admitting: Nurse Practitioner

## 2014-09-25 NOTE — Telephone Encounter (Signed)
Pt picking up meds today

## 2014-10-19 ENCOUNTER — Telehealth: Payer: Self-pay | Admitting: Nurse Practitioner

## 2014-10-19 NOTE — Telephone Encounter (Signed)
Patient aware of bone spurs in her foot.

## 2014-11-09 ENCOUNTER — Ambulatory Visit: Payer: BC Managed Care – PPO | Admitting: Nurse Practitioner

## 2014-11-10 ENCOUNTER — Ambulatory Visit (INDEPENDENT_AMBULATORY_CARE_PROVIDER_SITE_OTHER): Payer: BC Managed Care – PPO | Admitting: Nurse Practitioner

## 2014-11-10 ENCOUNTER — Encounter: Payer: Self-pay | Admitting: Nurse Practitioner

## 2014-11-10 ENCOUNTER — Other Ambulatory Visit: Payer: Self-pay | Admitting: Nurse Practitioner

## 2014-11-10 VITALS — BP 119/71 | HR 75 | Temp 97.6°F | Ht 62.0 in | Wt 200.0 lb

## 2014-11-10 DIAGNOSIS — Z01419 Encounter for gynecological examination (general) (routine) without abnormal findings: Secondary | ICD-10-CM

## 2014-11-10 DIAGNOSIS — E119 Type 2 diabetes mellitus without complications: Secondary | ICD-10-CM

## 2014-11-10 DIAGNOSIS — I10 Essential (primary) hypertension: Secondary | ICD-10-CM

## 2014-11-10 DIAGNOSIS — Z0000 Encounter for general adult medical examination without abnormal findings: Secondary | ICD-10-CM

## 2014-11-10 DIAGNOSIS — R569 Unspecified convulsions: Secondary | ICD-10-CM

## 2014-11-10 DIAGNOSIS — E785 Hyperlipidemia, unspecified: Secondary | ICD-10-CM

## 2014-11-10 DIAGNOSIS — G8929 Other chronic pain: Secondary | ICD-10-CM

## 2014-11-10 DIAGNOSIS — M542 Cervicalgia: Secondary | ICD-10-CM

## 2014-11-10 LAB — POCT URINALYSIS DIPSTICK
Bilirubin, UA: NEGATIVE
Blood, UA: NEGATIVE
Glucose, UA: NEGATIVE
Ketones, UA: NEGATIVE
LEUKOCYTES UA: NEGATIVE
NITRITE UA: NEGATIVE
PROTEIN UA: NEGATIVE
Spec Grav, UA: 1.015
UROBILINOGEN UA: NEGATIVE
pH, UA: 5

## 2014-11-10 LAB — POCT UA - MICROSCOPIC ONLY
Bacteria, U Microscopic: NEGATIVE
Casts, Ur, LPF, POC: NEGATIVE
Crystals, Ur, HPF, POC: NEGATIVE
Mucus, UA: NEGATIVE
RBC, urine, microscopic: NEGATIVE
WBC, UR, HPF, POC: NEGATIVE
Yeast, UA: NEGATIVE

## 2014-11-10 LAB — POCT UA - MICROALBUMIN: MICROALBUMIN (UR) POC: NEGATIVE mg/L

## 2014-11-10 LAB — POCT GLYCOSYLATED HEMOGLOBIN (HGB A1C): Hemoglobin A1C: 6.3

## 2014-11-10 MED ORDER — CARVEDILOL 6.25 MG PO TABS: 6.2500 mg | ORAL_TABLET | Freq: Two times a day (BID) | ORAL | Status: DC

## 2014-11-10 MED ORDER — CARBAMAZEPINE ER 400 MG PO TB12: 400.0000 mg | ORAL_TABLET | Freq: Two times a day (BID) | ORAL | Status: DC

## 2014-11-10 MED ORDER — DICLOFENAC-MISOPROSTOL 75-0.2 MG PO TBEC: 1.0000 | DELAYED_RELEASE_TABLET | Freq: Two times a day (BID) | ORAL | Status: DC

## 2014-11-10 MED ORDER — METFORMIN HCL 500 MG PO TABS: 500.0000 mg | ORAL_TABLET | Freq: Two times a day (BID) | ORAL | Status: DC

## 2014-11-10 MED ORDER — SIMVASTATIN 40 MG PO TABS: 40.0000 mg | ORAL_TABLET | Freq: Every day | ORAL | Status: DC

## 2014-11-10 NOTE — Progress Notes (Signed)
Subjective:    Patient ID: Ellen Delgado, female    DOB: February 24, 1966, 48 y.o.   MRN: [id number]  Patient here today for follow up of multiple medical problems- no complaints today- no changes since last visit. She did c/o rectal bleeding at last visit. Just resolved  On its own- Needs colonoscopy but can't afford to do right now.  Hypertension This is a chronic problem. The current episode started more than 1 year ago. The problem is unchanged. The problem is controlled. Pertinent negatives include no chest pain, headaches, neck pain or shortness of breath. Risk factors for coronary artery disease include diabetes mellitus, dyslipidemia, obesity and post-menopausal state. Past treatments include beta blockers, ACE inhibitors and diuretics. The current treatment provides significant improvement. Compliance problems include diet and exercise.   Hyperlipidemia This is a chronic problem. The current episode started more than 1 year ago. The problem is controlled. Exacerbating diseases include diabetes and obesity. She has no history of hypothyroidism. Pertinent negatives include no chest pain or shortness of breath. Current antihyperlipidemic treatment includes statins. The current treatment provides significant improvement of lipids. Compliance problems include adherence to diet and adherence to exercise.  Risk factors for coronary artery disease include diabetes mellitus, dyslipidemia, hypertension, obesity and post-menopausal.  Diabetes She presents for her follow-up diabetic visit. She has type 2 diabetes mellitus. No MedicAlert identification noted. Her disease course has been stable. Hypoglycemia symptoms include seizures. Pertinent negatives for hypoglycemia include no headaches. Pertinent negatives for diabetes include no chest pain, no polydipsia, no polyphagia, no polyuria, no visual change and no weakness. Risk factors for coronary artery disease include diabetes mellitus, dyslipidemia,  hypertension, obesity and post-menopausal. Current diabetic treatment includes oral agent (monotherapy). Her weight is stable. She has not had a previous visit with a dietitian. She rarely participates in exercise. Her breakfast blood glucose is taken between 8-9 am. Her breakfast blood glucose range is generally 110-130 mg/dl. Her overall blood glucose range is 110-130 mg/dl. An ACE inhibitor/angiotensin II receptor blocker is being taken. She does not see a podiatrist.Eye exam is current.  Seizures  This is a chronic problem. The problem has not changed since onset.There was 1 seizure. Pertinent negatives include no headaches and no chest pain.  GAD/ Panic attacks Buspar as directed. Helps a lot with her anxiety. Chronic back pain Uses flexeril 2 x a day and needs refill of meds   Review of Systems  Respiratory: Negative for shortness of breath.   Cardiovascular: Negative for chest pain.  Endocrine: Negative for polydipsia, polyphagia and polyuria.  Musculoskeletal: Negative for neck pain.  Neurological: Positive for seizures. Negative for weakness and headaches.  All other systems reviewed and are negative.      Objective:   Physical Exam  Constitutional: She is oriented to person, place, and time. She appears well-developed and well-nourished.  HENT:  Head: Normocephalic.  Right Ear: Hearing, tympanic membrane, external ear and ear canal normal.  Left Ear: Hearing, tympanic membrane, external ear and ear canal normal.  Nose: Nose normal.  Mouth/Throat: Uvula is midline and oropharynx is clear and moist.  Eyes: Conjunctivae and EOM are normal. Pupils are equal, round, and reactive to light.  Neck: Normal range of motion and full passive range of motion without pain. Neck supple. No JVD present. Carotid bruit is not present. No thyroid mass and no thyromegaly present.  Cardiovascular: Normal rate, normal heart sounds and intact distal pulses.   No murmur heard. Pulmonary/Chest:  Effort normal and breath sounds  normal. Right breast exhibits no inverted nipple, no mass, no nipple discharge, no skin change and no tenderness. Left breast exhibits no inverted nipple, no mass, no nipple discharge, no skin change and no tenderness.  Abdominal: Soft. Bowel sounds are normal. She exhibits no mass. There is no tenderness.  Genitourinary: Vagina normal and uterus normal. No breast swelling, tenderness, discharge or bleeding.  bimanual exam-No adnexal masses or tenderness. Cervix nonparous and pink- no discharge  Musculoskeletal: Normal range of motion.  Lymphadenopathy:    She has no cervical adenopathy.  Neurological: She is alert and oriented to person, place, and time.  Skin: Skin is warm and dry.  Psychiatric: She has a normal mood and affect. Her behavior is normal. Judgment and thought content normal.  BP 119/71 mmHg  Pulse 75  Temp(Src) 97.6 F (36.4 C) (Oral)  Ht _0  (1.575 m)  Wt 200 lb (90.719 kg)  BMI 36.57 kg/m2   Results for orders placed or performed in visit on 11/10/14  POCT glycosylated hemoglobin (Hb A1C)  Result Value Ref Range   Hemoglobin A1C 6.3%           Assessment & Plan:   1. Annual physical exam - POCT urinalysis dipstick - POCT UA - Microscopic Only  2. Encounter for routine gynecological examination  3. Type 2 diabetes mellitus without complication Carb counting - POCT glycosylated hemoglobin (Hb A1C) - POCT UA - Microalbumin - metFORMIN (GLUCOPHAGE) 500 MG tablet; Take 1 tablet (500 mg total) by mouth 2 (two) times daily with a meal.  Dispense: 60 tablet; Refill: 5  4. Hyperlipidemia Low fat diet - NMR, lipoprofile - simvastatin (ZOCOR) 40 MG tablet; Take 1 tablet (40 mg total) by mouth at bedtime.  Dispense: 30 tablet; Refill: 5  5. Essential hypertension No salt added to diet - CMP14+EGFR - carvedilol (COREG) 6.25 MG tablet; Take 1 tablet (6.25 mg total) by mouth 2 (two) times daily with a meal.  Dispense: 60 tablet;  Refill: 5  6. Chronic neck pain Moist heat - Diclofenac-Misoprostol (ARTHROTEC) 75-0.2 MG TBEC; Take 1 tablet by mouth 2 (two) times daily.  Dispense: 60 tablet; Refill: 2  7. Seizures - carbamazepine (TEGRETOL XR) 400 MG 12 hr tablet; Take 1 tablet (400 mg total) by mouth 2 (two) times daily.  Dispense: 60 tablet; Refill: 5   hemoccult cards given to patient with directions Labs pending Health maintenance reviewed Diet and exercise encouraged Continue all meds Follow up  In 3 months   [place], FNP

## 2014-11-10 NOTE — Patient Instructions (Signed)

## 2014-11-11 ENCOUNTER — Telehealth: Payer: Self-pay | Admitting: Nurse Practitioner

## 2014-11-11 LAB — PAP IG W/ RFLX HPV ASCU: PAP SMEAR COMMENT: 0

## 2014-11-11 LAB — NMR, LIPOPROFILE
Cholesterol: 200 mg/dL — ABNORMAL HIGH (ref 100–199)
HDL Cholesterol by NMR: 45 mg/dL (ref >39)
HDL Particle Number: 35.1 umol/L (ref >=30.5)
LDL PARTICLE NUMBER: 1623 nmol/L — AB (ref <1000)
LDL SIZE: 19.5 nm (ref >20.5)
LDL-C: 95 mg/dL (ref 0–99)
LP-IR SCORE: 75 — AB (ref <=45)
Small LDL Particle Number: 1269 nmol/L — ABNORMAL HIGH (ref <=527)
Triglycerides by NMR: 301 mg/dL — ABNORMAL HIGH (ref 0–149)

## 2014-11-11 LAB — CMP14+EGFR
ALT: 16 IU/L (ref 0–32)
AST: 15 IU/L (ref 0–40)
Albumin/Globulin Ratio: 1.6 (ref 1.1–2.5)
Albumin: 3.7 g/dL (ref 3.5–5.5)
Alkaline Phosphatase: 55 IU/L (ref 39–117)
BILIRUBIN TOTAL: 0.2 mg/dL (ref 0.0–1.2)
BUN/Creatinine Ratio: 28 — ABNORMAL HIGH (ref 9–23)
BUN: 22 mg/dL (ref 6–24)
CHLORIDE: 97 mmol/L (ref 97–108)
CO2: 24 mmol/L (ref 18–29)
Calcium: 9 mg/dL (ref 8.7–10.2)
Creatinine, Ser: 0.8 mg/dL (ref 0.57–1.00)
GFR calc Af Amer: 100 mL/min/{1.73_m2} (ref >59)
GFR calc non Af Amer: 87 mL/min/{1.73_m2} (ref >59)
GLUCOSE: 142 mg/dL — AB (ref 65–99)
Globulin, Total: 2.3 g/dL (ref 1.5–4.5)
POTASSIUM: 4.8 mmol/L (ref 3.5–5.2)
Sodium: 137 mmol/L (ref 134–144)
TOTAL PROTEIN: 6 g/dL (ref 6.0–8.5)

## 2014-11-12 MED ORDER — TRAMADOL HCL 50 MG PO TABS: 50.0000 mg | ORAL_TABLET | Freq: Two times a day (BID) | ORAL | Status: DC

## 2014-11-12 NOTE — Telephone Encounter (Signed)
Tramadol 50 mg, 1 tablet once or twice daily if needed for severe pain

## 2014-11-12 NOTE — Telephone Encounter (Signed)
[facility]

## 2014-11-12 NOTE — Telephone Encounter (Signed)
Pt states she has bone spur on foot and discussed with MMM at appt this week. Pt wants something for pain? See visit from 05/1714 about heel spur.

## 2014-11-18 ENCOUNTER — Telehealth: Payer: Self-pay | Admitting: Nurse Practitioner

## 2014-11-18 NOTE — Telephone Encounter (Signed)
-----   [facility] FNP sent at 11/18/2014  8:09 AM EST ----- Hgba1c discussed at appointment Kidney and liver function stable ldl are increasing- need to get back on statin and take daily Urine clear Pap normal- repeat in 2 years Continue current meds- low fat diet and exercise and recheck in 3 months

## 2014-11-18 NOTE — Telephone Encounter (Signed)
-----   Message from Mary-Margaret Martin, FNP sent at 11/18/2014  8:09 AM EST ----- °Hgba1c discussed at appointment °Kidney and liver function stable °ldl are increasing- need to get back on statin and take daily °Urine clear °Pap normal- repeat in 2 years °Continue current meds- low fat diet and exercise and recheck in 3 months ° ° °

## 2014-11-18 NOTE — Telephone Encounter (Signed)
Left on home am as per DPR of 04/2013

## 2014-11-30 ENCOUNTER — Other Ambulatory Visit: Payer: Self-pay | Admitting: *Deleted

## 2014-11-30 MED ORDER — ASPIRIN EC 81 MG PO TBEC
81.0000 mg | DELAYED_RELEASE_TABLET | Freq: Every day | ORAL | Status: DC
Start: 2014-11-30 — End: 2022-06-16

## 2015-02-22 ENCOUNTER — Ambulatory Visit: Payer: BC Managed Care – PPO | Admitting: Nurse Practitioner

## 2015-03-02 ENCOUNTER — Other Ambulatory Visit: Payer: Self-pay | Admitting: *Deleted

## 2015-03-02 DIAGNOSIS — G8929 Other chronic pain: Secondary | ICD-10-CM

## 2015-03-02 DIAGNOSIS — M542 Cervicalgia: Principal | ICD-10-CM

## 2015-03-02 MED ORDER — DICLOFENAC-MISOPROSTOL 75-0.2 MG PO TBEC: 1.0000 | DELAYED_RELEASE_TABLET | Freq: Two times a day (BID) | ORAL | Status: DC

## 2015-03-18 ENCOUNTER — Encounter: Payer: Self-pay | Admitting: Nurse Practitioner

## 2015-03-18 ENCOUNTER — Ambulatory Visit (INDEPENDENT_AMBULATORY_CARE_PROVIDER_SITE_OTHER): Payer: BLUE CROSS/BLUE SHIELD | Admitting: Nurse Practitioner

## 2015-03-18 VITALS — BP 106/64 | HR 78 | Temp 96.9°F | Ht 62.0 in | Wt 192.0 lb

## 2015-03-18 DIAGNOSIS — M542 Cervicalgia: Secondary | ICD-10-CM | POA: Diagnosis not present

## 2015-03-18 DIAGNOSIS — Q759 Congenital malformation of skull and face bones, unspecified: Secondary | ICD-10-CM | POA: Diagnosis not present

## 2015-03-18 DIAGNOSIS — M5136 Other intervertebral disc degeneration, lumbar region: Secondary | ICD-10-CM | POA: Diagnosis not present

## 2015-03-18 DIAGNOSIS — K219 Gastro-esophageal reflux disease without esophagitis: Secondary | ICD-10-CM | POA: Diagnosis not present

## 2015-03-18 DIAGNOSIS — E785 Hyperlipidemia, unspecified: Secondary | ICD-10-CM | POA: Diagnosis not present

## 2015-03-18 DIAGNOSIS — E119 Type 2 diabetes mellitus without complications: Secondary | ICD-10-CM | POA: Diagnosis not present

## 2015-03-18 DIAGNOSIS — M4120 Other idiopathic scoliosis, site unspecified: Secondary | ICD-10-CM

## 2015-03-18 DIAGNOSIS — F410 Panic disorder [episodic paroxysmal anxiety] without agoraphobia: Secondary | ICD-10-CM

## 2015-03-18 DIAGNOSIS — G8929 Other chronic pain: Secondary | ICD-10-CM

## 2015-03-18 DIAGNOSIS — R569 Unspecified convulsions: Secondary | ICD-10-CM | POA: Diagnosis not present

## 2015-03-18 DIAGNOSIS — I10 Essential (primary) hypertension: Secondary | ICD-10-CM

## 2015-03-18 LAB — POCT GLYCOSYLATED HEMOGLOBIN (HGB A1C): Hemoglobin A1C: 6.6

## 2015-03-18 MED ORDER — CYCLOBENZAPRINE HCL 10 MG PO TABS: 10.0000 mg | ORAL_TABLET | Freq: Three times a day (TID) | ORAL | Status: DC | PRN

## 2015-03-18 MED ORDER — BUSPIRONE HCL 15 MG PO TABS: 15.0000 mg | ORAL_TABLET | Freq: Two times a day (BID) | ORAL | Status: DC | PRN

## 2015-03-18 MED ORDER — METFORMIN HCL 500 MG PO TABS: 500.0000 mg | ORAL_TABLET | Freq: Two times a day (BID) | ORAL | Status: DC

## 2015-03-18 MED ORDER — SIMVASTATIN 40 MG PO TABS: 40.0000 mg | ORAL_TABLET | Freq: Every day | ORAL | Status: DC

## 2015-03-18 MED ORDER — CARBAMAZEPINE ER 400 MG PO TB12: 400.0000 mg | ORAL_TABLET | Freq: Two times a day (BID) | ORAL | Status: DC

## 2015-03-18 MED ORDER — LISINOPRIL-HYDROCHLOROTHIAZIDE 20-25 MG PO TABS: 1.0000 | ORAL_TABLET | Freq: Every day | ORAL | Status: DC

## 2015-03-18 MED ORDER — CARVEDILOL 6.25 MG PO TABS: 6.2500 mg | ORAL_TABLET | Freq: Two times a day (BID) | ORAL | Status: DC

## 2015-03-18 NOTE — Patient Instructions (Signed)
Exercise to Stay Healthy Exercise helps you become and stay healthy. EXERCISE IDEAS AND TIPS Choose exercises that:  You enjoy.  Fit into your day. You do not need to exercise really hard to be healthy. You can do exercises at a slow or medium level and stay healthy. You can:  Stretch before and after working out.  Try yoga, Pilates, or tai chi.  Lift weights.  Walk fast, swim, jog, run, climb stairs, bicycle, dance, or rollerskate.  Take aerobic classes. Exercises that burn about 150 calories:  Running 1  miles in 15 minutes.  Playing volleyball for 45 to 60 minutes.  Washing and waxing a car for 45 to 60 minutes.  Playing touch football for 45 minutes.  Walking 1  miles in 35 minutes.  Pushing a stroller 1  miles in 30 minutes.  Playing basketball for 30 minutes.  Raking leaves for 30 minutes.  Bicycling 5 miles in 30 minutes.  Walking 2 miles in 30 minutes.  Dancing for 30 minutes.  Shoveling snow for 15 minutes.  Swimming laps for 20 minutes.  Walking up stairs for 15 minutes.  Bicycling 4 miles in 15 minutes.  Gardening for 30 to 45 minutes.  Jumping rope for 15 minutes.  Washing windows or floors for 45 to 60 minutes. Document Released: 12/30/2010 Document Revised: 02/19/2012 Document Reviewed: 12/30/2010 Exit[employer] Patient Information 2015 [employer]. This information is not intended to replace advice given to you by your health care provider. Make sure you discuss any questions you have with your health care provider.  

## 2015-03-18 NOTE — Progress Notes (Signed)
Subjective:    Patient ID: Ellen Delgado, female    DOB: 07/15/1966, 49 y.o.   MRN: [id number]  Patient here today for follow up of multiple medical problems- no complaints today- no changes since last visit. Recently separated from husband - having a rough time with it.  Hypertension This is a chronic problem. The current episode started more than 1 year ago. The problem is unchanged. The problem is controlled. Pertinent negatives include no chest pain, headaches, neck pain or shortness of breath. Risk factors for coronary artery disease include diabetes mellitus, dyslipidemia, obesity and post-menopausal state. Past treatments include beta blockers, ACE inhibitors and diuretics. The current treatment provides significant improvement. Compliance problems include diet and exercise.   Hyperlipidemia This is a chronic problem. The current episode started more than 1 year ago. The problem is controlled. Exacerbating diseases include diabetes and obesity. She has no history of hypothyroidism. Pertinent negatives include no chest pain or shortness of breath. Current antihyperlipidemic treatment includes statins. The current treatment provides significant improvement of lipids. Compliance problems include adherence to diet and adherence to exercise.  Risk factors for coronary artery disease include diabetes mellitus, dyslipidemia, hypertension, obesity and post-menopausal.  Diabetes She presents for her follow-up diabetic visit. She has type 2 diabetes mellitus. No MedicAlert identification noted. Her disease course has been stable. Hypoglycemia symptoms include seizures. Pertinent negatives for hypoglycemia include no headaches. Pertinent negatives for diabetes include no chest pain, no polydipsia, no polyphagia, no polyuria, no visual change and no weakness. Risk factors for coronary artery disease include diabetes mellitus, dyslipidemia, hypertension, obesity and post-menopausal. Current diabetic  treatment includes oral agent (monotherapy). Her weight is stable. She has not had a previous visit with a dietitian. She rarely participates in exercise. Her breakfast blood glucose is taken between 8-9 am. Her breakfast blood glucose range is generally 110-130 mg/dl. Her overall blood glucose range is 110-130 mg/dl. An ACE inhibitor/angiotensin II receptor blocker is being taken. She does not see a podiatrist.Eye exam is current.  Seizures  This is a chronic problem. The problem has not changed since onset.There was 1 seizure. The most recent episode lasted less than 30 seconds. Pertinent negatives include no headaches and no chest pain.  GAD/ Panic attacks Buspar as directed. Helps a lot with her anxiety. Chronic back pain Uses flexeril 2 x a day and needs refill of meds   Review of Systems  Respiratory: Negative for shortness of breath.   Cardiovascular: Negative for chest pain.  Endocrine: Negative for polydipsia, polyphagia and polyuria.  Musculoskeletal: Negative for neck pain.  Neurological: Positive for seizures. Negative for weakness and headaches.  All other systems reviewed and are negative.      Objective:   Physical Exam  Constitutional: She is oriented to person, place, and time. She appears well-developed and well-nourished.  HENT:  Head: Normocephalic.  Right Ear: Hearing, tympanic membrane, external ear and ear canal normal.  Left Ear: Hearing, tympanic membrane, external ear and ear canal normal.  Nose: Nose normal.  Mouth/Throat: Uvula is midline and oropharynx is clear and moist.  Soft spot about 6cm on scalp with bone deformity.  Eyes: Conjunctivae and EOM are normal. Pupils are equal, round, and reactive to light.  Neck: Normal range of motion and full passive range of motion without pain. Neck supple. No JVD present. Carotid bruit is not present. No thyroid mass and no thyromegaly present.  Cardiovascular: Normal rate, normal heart sounds and intact distal  pulses.   No murmur  heard. Pulmonary/Chest: Effort normal and breath sounds normal. Right breast exhibits no inverted nipple, no mass, no nipple discharge, no skin change and no tenderness. Left breast exhibits no inverted nipple, no mass, no nipple discharge, no skin change and no tenderness.  Abdominal: Soft. Bowel sounds are normal. She exhibits no mass. There is no tenderness.  Genitourinary: Vagina normal and uterus normal. No breast swelling, tenderness, discharge or bleeding.  bimanual exam-No adnexal masses or tenderness. Cervix nonparous and pink- no discharge  Musculoskeletal: Normal range of motion.  scoliosis  Lymphadenopathy:    She has no cervical adenopathy.  Neurological: She is alert and oriented to person, place, and time.  Skin: Skin is warm and dry.  Psychiatric: She has a normal mood and affect. Her behavior is normal. Judgment and thought content normal.  BP 106/64 mmHg  Pulse 78  Temp(Src) 96.9 F (36.1 C) (Oral)  Ht _0  (1.575 m)  Wt 192 lb (87.091 kg)  BMI 35.11 kg/m2   Results for orders placed or performed in visit on 03/18/15  POCT glycosylated hemoglobin (Hb A1C)  Result Value Ref Range   Hemoglobin A1C 6.6%           Assessment & Plan:   1. Type 2 diabetes mellitus without complication contiue to watch carbs in diet - POCT glycosylated hemoglobin (Hb A1C) - metFORMIN (GLUCOPHAGE) 500 MG tablet; Take 1 tablet (500 mg total) by mouth 2 (two) times daily with a meal.  Dispense: 60 tablet; Refill: 5  2. Hyperlipidemia Low fat diet - NMR, lipoprofile - simvastatin (ZOCOR) 40 MG tablet; Take 1 tablet (40 mg total) by mouth at bedtime.  Dispense: 30 tablet; Refill: 5  3. Essential hypertension Do not add slat o diet - CMP14+EGFR - carvedilol (COREG) 6.25 MG tablet; Take 1 tablet (6.25 mg total) by mouth 2 (two) times daily with a meal.  Dispense: 60 tablet; Refill: 5 - lisinopril-hydrochlorothiazide (PRINZIDE,ZESTORETIC) 20-25 MG per tablet;  Take 1 tablet by mouth daily.  Dispense: 90 tablet; Refill: 1  4. Gastroesophageal reflux disease without esophagitis Avoid spicy foods  5. Seizures - carbamazepine (TEGRETOL XR) 400 MG 12 hr tablet; Take 1 tablet (400 mg total) by mouth 2 (two) times daily.  Dispense: 60 tablet; Refill: 5  6. Panic attacks Stress management - busPIRone (BUSPAR) 15 MG tablet; Take 1 tablet (15 mg total) by mouth 2 (two) times daily as needed. Every 12 hours as needed  Dispense: 60 tablet; Refill: 5  7. Anomaly of cranium  8. Open anterior fontanelle Be very careful and do not get hit in spot  9. Chronic neck pain   - cyclobenzaprine (FLEXERIL) 10 MG tablet; Take 1 tablet (10 mg total) by mouth 3 (three) times daily as needed for muscle spasms.  Dispense: 90 tablet; Refill: 2  10. scoliosis   Labs pending Health maintenance reviewed Diet and exercise encouraged Continue all meds Follow up  In 3 month  [place], FNP

## 2015-03-19 LAB — CMP14+EGFR
ALK PHOS: 54 IU/L (ref 39–117)
ALT: 16 IU/L (ref 0–32)
AST: 13 IU/L (ref 0–40)
Albumin/Globulin Ratio: 1.7 (ref 1.1–2.5)
Albumin: 4 g/dL (ref 3.5–5.5)
BUN/Creatinine Ratio: 27 — ABNORMAL HIGH (ref 9–23)
BUN: 21 mg/dL (ref 6–24)
Bilirubin Total: <0.2 mg/dL (ref 0.0–1.2)
CALCIUM: 9.2 mg/dL (ref 8.7–10.2)
CHLORIDE: 97 mmol/L (ref 97–108)
CO2: 26 mmol/L (ref 18–29)
CREATININE: 0.78 mg/dL (ref 0.57–1.00)
GFR calc Af Amer: 103 mL/min/{1.73_m2} (ref >59)
GFR calc non Af Amer: 90 mL/min/{1.73_m2} (ref >59)
Globulin, Total: 2.4 g/dL (ref 1.5–4.5)
Glucose: 109 mg/dL — ABNORMAL HIGH (ref 65–99)
POTASSIUM: 4.3 mmol/L (ref 3.5–5.2)
SODIUM: 138 mmol/L (ref 134–144)
Total Protein: 6.4 g/dL (ref 6.0–8.5)

## 2015-03-19 LAB — NMR, LIPOPROFILE
Cholesterol: 157 mg/dL (ref 100–199)
HDL CHOLESTEROL BY NMR: 47 mg/dL (ref >39)
HDL Particle Number: 33.7 umol/L (ref >=30.5)
LDL Particle Number: 795 nmol/L (ref <1000)
LDL SIZE: 19.8 nm (ref >20.5)
LDL-C: 48 mg/dL (ref 0–99)
LP-IR Score: 55 — ABNORMAL HIGH (ref <=45)
SMALL LDL PARTICLE NUMBER: 629 nmol/L — AB (ref <=527)
TRIGLYCERIDES BY NMR: 310 mg/dL — AB (ref 0–149)

## 2015-04-10 ENCOUNTER — Other Ambulatory Visit: Payer: Self-pay | Admitting: *Deleted

## 2015-04-10 DIAGNOSIS — G8929 Other chronic pain: Secondary | ICD-10-CM

## 2015-04-10 DIAGNOSIS — M542 Cervicalgia: Principal | ICD-10-CM

## 2015-04-10 MED ORDER — DICLOFENAC-MISOPROSTOL 75-0.2 MG PO TBEC: 1.0000 | DELAYED_RELEASE_TABLET | Freq: Two times a day (BID) | ORAL | Status: DC

## 2015-04-12 ENCOUNTER — Other Ambulatory Visit: Payer: Self-pay | Admitting: Family Medicine

## 2015-04-13 NOTE — Telephone Encounter (Signed)
Ultram rx ready for pick up  

## 2015-04-13 NOTE — Telephone Encounter (Signed)
Last seen 03/18/15 MMM  If approved print

## 2015-04-14 NOTE — Telephone Encounter (Signed)
Patient aware rx ready to be picked up 

## 2015-06-22 ENCOUNTER — Other Ambulatory Visit: Payer: Self-pay | Admitting: Family Medicine

## 2015-06-25 ENCOUNTER — Ambulatory Visit (INDEPENDENT_AMBULATORY_CARE_PROVIDER_SITE_OTHER): Payer: BLUE CROSS/BLUE SHIELD | Admitting: Nurse Practitioner

## 2015-06-25 ENCOUNTER — Encounter (INDEPENDENT_AMBULATORY_CARE_PROVIDER_SITE_OTHER): Payer: Self-pay

## 2015-06-25 ENCOUNTER — Encounter: Payer: Self-pay | Admitting: Nurse Practitioner

## 2015-06-25 VITALS — BP 104/70 | HR 72 | Temp 97.2°F | Ht 62.0 in | Wt 169.0 lb

## 2015-06-25 DIAGNOSIS — K219 Gastro-esophageal reflux disease without esophagitis: Secondary | ICD-10-CM | POA: Diagnosis not present

## 2015-06-25 DIAGNOSIS — F410 Panic disorder [episodic paroxysmal anxiety] without agoraphobia: Secondary | ICD-10-CM | POA: Diagnosis not present

## 2015-06-25 DIAGNOSIS — M5136 Other intervertebral disc degeneration, lumbar region: Secondary | ICD-10-CM | POA: Diagnosis not present

## 2015-06-25 DIAGNOSIS — G8929 Other chronic pain: Secondary | ICD-10-CM | POA: Diagnosis not present

## 2015-06-25 DIAGNOSIS — R569 Unspecified convulsions: Secondary | ICD-10-CM

## 2015-06-25 DIAGNOSIS — M542 Cervicalgia: Secondary | ICD-10-CM

## 2015-06-25 DIAGNOSIS — E119 Type 2 diabetes mellitus without complications: Secondary | ICD-10-CM

## 2015-06-25 DIAGNOSIS — E785 Hyperlipidemia, unspecified: Secondary | ICD-10-CM

## 2015-06-25 DIAGNOSIS — I10 Essential (primary) hypertension: Secondary | ICD-10-CM

## 2015-06-25 LAB — POCT GLYCOSYLATED HEMOGLOBIN (HGB A1C): Hemoglobin A1C: 6.1

## 2015-06-25 MED ORDER — TRAMADOL HCL 50 MG PO TABS: ORAL_TABLET | ORAL | Status: DC

## 2015-06-25 MED ORDER — LISINOPRIL-HYDROCHLOROTHIAZIDE 20-25 MG PO TABS: 1.0000 | ORAL_TABLET | Freq: Every day | ORAL | Status: DC

## 2015-06-25 MED ORDER — DICLOFENAC-MISOPROSTOL 75-0.2 MG PO TBEC: 1.0000 | DELAYED_RELEASE_TABLET | Freq: Two times a day (BID) | ORAL | Status: DC

## 2015-06-25 NOTE — Progress Notes (Signed)
Subjective:    Patient ID: Ellen Delgado, female    DOB: 07/28/1966, 49 y.o.   MRN: [id number]  Patient here today for follow up of multiple medical problems- no complaints today- no changes since last visit.   Hypertension This is a chronic problem. The current episode started more than 1 year ago. The problem is unchanged. The problem is controlled. Pertinent negatives include no chest pain, headaches, neck pain or shortness of breath. Risk factors for coronary artery disease include diabetes mellitus, dyslipidemia, obesity and post-menopausal state. Past treatments include beta blockers, ACE inhibitors and diuretics. The current treatment provides significant improvement. Compliance problems include diet and exercise.   Hyperlipidemia This is a chronic problem. The current episode started more than 1 year ago. The problem is controlled. Exacerbating diseases include diabetes and obesity. She has no history of hypothyroidism. Pertinent negatives include no chest pain or shortness of breath. Current antihyperlipidemic treatment includes statins. The current treatment provides significant improvement of lipids. Compliance problems include adherence to diet and adherence to exercise.  Risk factors for coronary artery disease include diabetes mellitus, dyslipidemia, hypertension, obesity and post-menopausal.  Diabetes She presents for her follow-up diabetic visit. She has type 2 diabetes mellitus. No MedicAlert identification noted. Her disease course has been stable. Hypoglycemia symptoms include seizures. Pertinent negatives for hypoglycemia include no headaches. Pertinent negatives for diabetes include no chest pain, no polydipsia, no polyphagia, no polyuria, no visual change and no weakness. Risk factors for coronary artery disease include diabetes mellitus, dyslipidemia, hypertension, obesity and post-menopausal. Current diabetic treatment includes oral agent (monotherapy). Her weight is stable. She  has not had a previous visit with a dietitian. She rarely participates in exercise. Her breakfast blood glucose is taken between 8-9 am. Her breakfast blood glucose range is generally 110-130 mg/dl. Her overall blood glucose range is 110-130 mg/dl. An ACE inhibitor/angiotensin II receptor blocker is being taken. She does not see a podiatrist.Eye exam is current.  Seizures  This is a chronic problem. The problem has not changed since onset.There was 1 seizure. The most recent episode lasted less than 30 seconds. Pertinent negatives include no headaches and no chest pain.  GAD/ Panic attacks Buspar as directed. Helps a lot with her anxiety. Chronic back pain Uses flexeril 2 x a day and needs refill of meds   Review of Systems  Respiratory: Negative for shortness of breath.   Cardiovascular: Negative for chest pain.  Endocrine: Negative for polydipsia, polyphagia and polyuria.  Musculoskeletal: Negative for neck pain.  Neurological: Positive for seizures. Negative for weakness and headaches.  All other systems reviewed and are negative.      Objective:   Physical Exam  Constitutional: She is oriented to person, place, and time. She appears well-developed and well-nourished.  HENT:  Head: Normocephalic.  Right Ear: Hearing, tympanic membrane, external ear and ear canal normal.  Left Ear: Hearing, tympanic membrane, external ear and ear canal normal.  Nose: Nose normal.  Mouth/Throat: Uvula is midline and oropharynx is clear and moist.  Soft spot about 6cm on scalp with bone deformity.  Eyes: Conjunctivae and EOM are normal. Pupils are equal, round, and reactive to light.  Neck: Normal range of motion and full passive range of motion without pain. Neck supple. No JVD present. Carotid bruit is not present. No thyroid mass and no thyromegaly present.  Cardiovascular: Normal rate, normal heart sounds and intact distal pulses.   No murmur heard. Pulmonary/Chest: Effort normal and breath  sounds normal. Right breast  exhibits no inverted nipple, no mass, no nipple discharge, no skin change and no tenderness. Left breast exhibits no inverted nipple, no mass, no nipple discharge, no skin change and no tenderness.  Abdominal: Soft. Bowel sounds are normal. She exhibits no mass. There is no tenderness.  Genitourinary: Vagina normal and uterus normal. No breast swelling, tenderness, discharge or bleeding.  bimanual exam-No adnexal masses or tenderness. Cervix nonparous and pink- no discharge  Musculoskeletal: Normal range of motion.  scoliosis  Lymphadenopathy:    She has no cervical adenopathy.  Neurological: She is alert and oriented to person, place, and time.  Skin: Skin is warm and dry.  Psychiatric: She has a normal mood and affect. Her behavior is normal. Judgment and thought content normal.  BP 104/70 mmHg  Pulse 72  Temp(Src) 97.2 F (36.2 C) (Oral)  Ht '5\' 2"'  (1.575 m)  Wt 169 lb (76.658 kg)  BMI 30.90 kg/m2   Results for orders placed or performed in visit on 06/25/15  POCT glycosylated hemoglobin (Hb A1C)  Result Value Ref Range   Hemoglobin A1C 6.1           Assessment & Plan:   1. Type 2 diabetes mellitus without complication Carb counting - POCT glycosylated hemoglobin (Hb A1C)  2. Hyperlipidemia Low fat diet - Lipid panel  3. Essential hypertension Do not add salt to diet - CMP14+EGFR - lisinopril-hydrochlorothiazide (PRINZIDE,ZESTORETIC) 20-25 MG per tablet; Take 1 tablet by mouth daily.  Dispense: 90 tablet; Refill: 1  4. Gastroesophageal reflux disease without esophagitis Avoid spicy foods Do not eat 2 hours prior to bedtime   5. DDD (degenerative disc disease), lumbar Back exercises  6. Seizures 7. Panic attacks Stress management    Labs pending Health maintenance reviewed Diet and exercise encouraged Continue all meds Follow up  In 3 month   [place], FNP

## 2015-06-25 NOTE — Patient Instructions (Signed)

## 2015-06-26 LAB — CMP14+EGFR
ALBUMIN: 4 g/dL (ref 3.5–5.5)
ALT: 19 IU/L (ref 0–32)
AST: 13 IU/L (ref 0–40)
Albumin/Globulin Ratio: 1.8 (ref 1.1–2.5)
Alkaline Phosphatase: 42 IU/L (ref 39–117)
BUN / CREAT RATIO: 29 — AB (ref 9–23)
BUN: 26 mg/dL — ABNORMAL HIGH (ref 6–24)
Bilirubin Total: <0.2 mg/dL (ref 0.0–1.2)
CALCIUM: 9.9 mg/dL (ref 8.7–10.2)
CHLORIDE: 98 mmol/L (ref 97–108)
CO2: 26 mmol/L (ref 18–29)
Creatinine, Ser: 0.91 mg/dL (ref 0.57–1.00)
GFR calc Af Amer: 86 mL/min/{1.73_m2} (ref >59)
GFR calc non Af Amer: 74 mL/min/{1.73_m2} (ref >59)
GLUCOSE: 93 mg/dL (ref 65–99)
Globulin, Total: 2.2 g/dL (ref 1.5–4.5)
POTASSIUM: 4.6 mmol/L (ref 3.5–5.2)
Sodium: 138 mmol/L (ref 134–144)
TOTAL PROTEIN: 6.2 g/dL (ref 6.0–8.5)

## 2015-06-26 LAB — LIPID PANEL
Chol/HDL Ratio: 3.8 ratio units (ref 0.0–4.4)
Cholesterol, Total: 189 mg/dL (ref 100–199)
HDL: 50 mg/dL (ref >39)
LDL Calculated: 89 mg/dL (ref 0–99)
Triglycerides: 252 mg/dL — ABNORMAL HIGH (ref 0–149)
VLDL Cholesterol Cal: 50 mg/dL — ABNORMAL HIGH (ref 5–40)

## 2015-07-19 ENCOUNTER — Encounter: Payer: Self-pay | Admitting: Nurse Practitioner

## 2015-09-06 ENCOUNTER — Other Ambulatory Visit: Payer: Self-pay | Admitting: Nurse Practitioner

## 2015-09-21 ENCOUNTER — Telehealth: Payer: Self-pay | Admitting: Nurse Practitioner

## 2015-09-21 DIAGNOSIS — I10 Essential (primary) hypertension: Secondary | ICD-10-CM

## 2015-09-21 MED ORDER — LISINOPRIL-HYDROCHLOROTHIAZIDE 20-25 MG PO TABS: 1.0000 | ORAL_TABLET | Freq: Every day | ORAL | Status: DC

## 2015-09-21 NOTE — Telephone Encounter (Signed)
done

## 2015-09-22 ENCOUNTER — Other Ambulatory Visit: Payer: Self-pay | Admitting: Nurse Practitioner

## 2015-10-04 ENCOUNTER — Ambulatory Visit (INDEPENDENT_AMBULATORY_CARE_PROVIDER_SITE_OTHER): Payer: BLUE CROSS/BLUE SHIELD | Admitting: Nurse Practitioner

## 2015-10-04 ENCOUNTER — Encounter: Payer: Self-pay | Admitting: Nurse Practitioner

## 2015-10-04 VITALS — BP 104/70 | HR 74 | Temp 97.1°F | Ht 62.0 in | Wt 155.0 lb

## 2015-10-04 DIAGNOSIS — G4700 Insomnia, unspecified: Secondary | ICD-10-CM

## 2015-10-04 DIAGNOSIS — E785 Hyperlipidemia, unspecified: Secondary | ICD-10-CM | POA: Diagnosis not present

## 2015-10-04 DIAGNOSIS — F410 Panic disorder [episodic paroxysmal anxiety] without agoraphobia: Secondary | ICD-10-CM

## 2015-10-04 DIAGNOSIS — I10 Essential (primary) hypertension: Secondary | ICD-10-CM | POA: Diagnosis not present

## 2015-10-04 DIAGNOSIS — E119 Type 2 diabetes mellitus without complications: Secondary | ICD-10-CM | POA: Diagnosis not present

## 2015-10-04 DIAGNOSIS — R569 Unspecified convulsions: Secondary | ICD-10-CM

## 2015-10-04 DIAGNOSIS — M5136 Other intervertebral disc degeneration, lumbar region: Secondary | ICD-10-CM | POA: Diagnosis not present

## 2015-10-04 DIAGNOSIS — M51369 Other intervertebral disc degeneration, lumbar region without mention of lumbar back pain or lower extremity pain: Secondary | ICD-10-CM

## 2015-10-04 LAB — POCT GLYCOSYLATED HEMOGLOBIN (HGB A1C): Hemoglobin A1C: 5.8

## 2015-10-04 MED ORDER — TRAMADOL HCL 50 MG PO TABS
ORAL_TABLET | ORAL | Status: DC
Start: 2015-10-04 — End: 2016-01-20

## 2015-10-04 MED ORDER — METFORMIN HCL 500 MG PO TABS: 500.0000 mg | ORAL_TABLET | Freq: Two times a day (BID) | ORAL | Status: DC

## 2015-10-04 MED ORDER — CARVEDILOL 6.25 MG PO TABS: 6.2500 mg | ORAL_TABLET | Freq: Two times a day (BID) | ORAL | Status: DC

## 2015-10-04 MED ORDER — CYCLOBENZAPRINE HCL 10 MG PO TABS: ORAL_TABLET | ORAL | Status: DC

## 2015-10-04 MED ORDER — BUSPIRONE HCL 15 MG PO TABS: 15.0000 mg | ORAL_TABLET | Freq: Two times a day (BID) | ORAL | Status: DC | PRN

## 2015-10-04 MED ORDER — CARBAMAZEPINE ER 400 MG PO TB12: 400.0000 mg | ORAL_TABLET | Freq: Two times a day (BID) | ORAL | Status: DC

## 2015-10-04 MED ORDER — LISINOPRIL-HYDROCHLOROTHIAZIDE 20-25 MG PO TABS: 1.0000 | ORAL_TABLET | Freq: Every day | ORAL | Status: DC

## 2015-10-04 MED ORDER — DICLOFENAC-MISOPROSTOL 75-0.2 MG PO TBEC: DELAYED_RELEASE_TABLET | ORAL | Status: DC

## 2015-10-04 MED ORDER — TRAZODONE HCL 50 MG PO TABS: 25.0000 mg | ORAL_TABLET | Freq: Every evening | ORAL | Status: DC | PRN

## 2015-10-04 MED ORDER — SIMVASTATIN 40 MG PO TABS: 40.0000 mg | ORAL_TABLET | Freq: Every day | ORAL | Status: DC

## 2015-10-04 NOTE — Progress Notes (Signed)
Subjective:    Patient ID: Ellen Delgado, female    DOB: [birth year], 49 y.o.   MRN: [id number]  Patient here today for follow up of multiple medical problems-Her only c/o toay is trouble falling asleep and staying asleep. Some nights she only sleeps 2 hours.   Hypertension This is a chronic problem. The current episode started more than 1 year ago. The problem is unchanged. The problem is controlled. Pertinent negatives include no chest pain, headaches, neck pain or shortness of breath. Risk factors for coronary artery disease include diabetes mellitus, dyslipidemia, obesity and post-menopausal state. Past treatments include beta blockers, ACE inhibitors and diuretics. The current treatment provides significant improvement. Compliance problems include diet and exercise.   Hyperlipidemia This is a chronic problem. The current episode started more than 1 year ago. The problem is controlled. Exacerbating diseases include diabetes and obesity. She has no history of hypothyroidism. Pertinent negatives include no chest pain or shortness of breath. Current antihyperlipidemic treatment includes statins. The current treatment provides significant improvement of lipids. Compliance problems include adherence to diet and adherence to exercise.  Risk factors for coronary artery disease include diabetes mellitus, dyslipidemia, hypertension, obesity and post-menopausal.  Diabetes She presents for her follow-up diabetic visit. She has type 2 diabetes mellitus. No MedicAlert identification noted. Her disease course has been stable. Hypoglycemia symptoms include seizures. Pertinent negatives for hypoglycemia include no headaches. Pertinent negatives for diabetes include no chest pain, no polydipsia, no polyphagia, no polyuria, no visual change and no weakness. Risk factors for coronary artery disease include diabetes mellitus, dyslipidemia, hypertension, obesity and post-menopausal. Current diabetic treatment includes  oral agent (monotherapy). Her weight is stable. She has not had a previous visit with a dietitian. She rarely participates in exercise. Her breakfast blood glucose is taken between 8-9 am. Her breakfast blood glucose range is generally 110-130 mg/dl. Her overall blood glucose range is 110-130 mg/dl. An ACE inhibitor/angiotensin II receptor blocker is being taken. She does not see a podiatrist.Eye exam is current.  Seizures  This is a chronic problem. The problem has not changed since onset.There was 1 seizure. The most recent episode lasted less than 30 seconds. Pertinent negatives include no headaches and no chest pain.  GAD/ Panic attacks Buspar as directed. Helps a lot with her anxiety. Chronic back pain/DDD Uses flexeril 2 x a day and needs refill of meds Jerrye Bushy Currently on no meds- no recent symptoms    Review of Systems  Constitutional: Negative.   HENT: Negative.   Respiratory: Negative for shortness of breath.   Cardiovascular: Negative for chest pain.  Endocrine: Negative for polydipsia, polyphagia and polyuria.  Musculoskeletal: Negative for neck pain.  Neurological: Positive for seizures. Negative for weakness and headaches.  Psychiatric/Behavioral: Negative.   All other systems reviewed and are negative.      Objective:   Physical Exam  Constitutional: She is oriented to person, place, and time. She appears well-developed and well-nourished.  HENT:  Head: Normocephalic.  Right Ear: Hearing, tympanic membrane, external ear and ear canal normal.  Left Ear: Hearing, tympanic membrane, external ear and ear canal normal.  Nose: Nose normal.  Mouth/Throat: Uvula is midline and oropharynx is clear and moist.  Soft spot about 6cm on scalp with bone deformity.  Eyes: Conjunctivae and EOM are normal. Pupils are equal, round, and reactive to light.  Neck: Normal range of motion and full passive range of motion without pain. Neck supple. No JVD present. Carotid bruit is not  present. No thyroid  mass and no thyromegaly present.  Cardiovascular: Normal rate, normal heart sounds and intact distal pulses.   No murmur heard. Pulmonary/Chest: Effort normal and breath sounds normal. Right breast exhibits no inverted nipple, no mass, no nipple discharge, no skin change and no tenderness. Left breast exhibits no inverted nipple, no mass, no nipple discharge, no skin change and no tenderness.  Abdominal: Soft. Bowel sounds are normal. She exhibits no mass. There is no tenderness.  Genitourinary: Vagina normal and uterus normal. No breast swelling, tenderness, discharge or bleeding.  bimanual exam-No adnexal masses or tenderness. Cervix nonparous and pink- no discharge  Musculoskeletal: Normal range of motion.  scoliosis  Lymphadenopathy:    She has no cervical adenopathy.  Neurological: She is alert and oriented to person, place, and time.  Skin: Skin is warm and dry.  Psychiatric: She has a normal mood and affect. Her behavior is normal. Judgment and thought content normal.   BP 104/70 mmHg  Pulse 74  Temp(Src) 97.1 F (36.2 C) (Oral)  Ht '5\' 2"'  (1.575 m)  Wt 155 lb (70.308 kg)  BMI 28.34 kg/m2         Assessment & Plan:   1. Essential hypertension Do not add salt to diet - CMP14+EGFR - lisinopril-hydrochlorothiazide (PRINZIDE,ZESTORETIC) 20-25 MG tablet; Take 1 tablet by mouth daily.  Dispense: 90 tablet; Refill: 0 - carvedilol (COREG) 6.25 MG tablet; Take 1 tablet (6.25 mg total) by mouth 2 (two) times daily with a meal.  Dispense: 60 tablet; Refill: 5  2. Type 2 diabetes mellitus without complication, without long-term current use of insulin (HCC) Carb counting encouraged - POCT glycosylated hemoglobin (Hb A1C) - metFORMIN (GLUCOPHAGE) 500 MG tablet; Take 1 tablet (500 mg total) by mouth 2 (two) times daily with a meal.  Dispense: 60 tablet; Refill: 5  3. Hyperlipidemia Low fat diet - Lipid panel - simvastatin (ZOCOR) 40 MG tablet; Take 1 tablet (40  mg total) by mouth at bedtime.  Dispense: 30 tablet; Refill: 5  4. Seizures (HCC) - carbamazepine (TEGRETOL XR) 400 MG 12 hr tablet; Take 1 tablet (400 mg total) by mouth 2 (two) times daily.  Dispense: 60 tablet; Refill: 5  5. Panic attacks Stress management - busPIRone (BUSPAR) 15 MG tablet; Take 1 tablet (15 mg total) by mouth 2 (two) times daily as needed. Every 12 hours as needed  Dispense: 60 tablet; Refill: 5  6. DDD (degenerative disc disease), lumbar Back strengthening exercses - traMADol (ULTRAM) 50 MG tablet; TAKE ONE TABLET TWICE A DAY.  Dispense: 60 tablet; Refill: 0  7. Insomnia Bedtime ritual - traZODone (DESYREL) 50 MG tablet; Take 0.5-1 tablets (25-50 mg total) by mouth at bedtime as needed for sleep.  Dispense: 30 tablet; Refill: 3    Labs pending Health maintenance reviewed Diet and exercise encouraged Continue all meds Follow up  In 3 month   [place], FNP

## 2015-10-04 NOTE — Patient Instructions (Signed)
Health Maintenance, Female Adopting a healthy lifestyle and getting preventive care can go a long way to promote health and wellness. Talk with your health care provider about what schedule of regular examinations is right for you. This is a good chance for you to check in with your provider about disease prevention and staying healthy. In between checkups, there are plenty of things you can do on your own. Experts have done a lot of research about which lifestyle changes and preventive measures are most likely to keep you healthy. Ask your health care provider for more information. WEIGHT AND DIET  Eat a healthy diet  Be sure to include plenty of vegetables, fruits, low-fat dairy products, and lean protein.  Do not eat a lot of foods high in solid fats, added sugars, or salt.  Get regular exercise. This is one of the most important things you can do for your health.  Most adults should exercise for at least 150 minutes each week. The exercise should increase your heart rate and make you sweat (moderate-intensity exercise).  Most adults should also do strengthening exercises at least twice a week. This is in addition to the moderate-intensity exercise.  Maintain a healthy weight  Body mass index (BMI) is a measurement that can be used to identify possible weight problems. It estimates body fat based on height and weight. Your health care provider can help determine your BMI and help you achieve or maintain a healthy weight.  For females 20 years of age and older:   A BMI below 18.5 is considered underweight.  A BMI of 18.5 to 24.9 is normal.  A BMI of 25 to 29.9 is considered overweight.  A BMI of 30 and above is considered obese.  Watch levels of cholesterol and blood lipids  You should start having your blood tested for lipids and cholesterol at 50 years of age, then have this test every 5 years.  You may need to have your cholesterol levels checked more often if:  Your lipid  or cholesterol levels are high.  You are older than 50 years of age.  You are at high risk for heart disease.  CANCER SCREENING   Lung Cancer  Lung cancer screening is recommended for adults 55-80 years old who are at high risk for lung cancer because of a history of smoking.  A yearly low-dose CT scan of the lungs is recommended for people who:  Currently smoke.  Have quit within the past 15 years.  Have at least a 30-pack-year history of smoking. A pack year is smoking an average of one pack of cigarettes a day for 1 year.  Yearly screening should continue until it has been 15 years since you quit.  Yearly screening should stop if you develop a health problem that would prevent you from having lung cancer treatment.  Breast Cancer  Practice breast self-awareness. This means understanding how your breasts normally appear and feel.  It also means doing regular breast self-exams. Let your health care provider know about any changes, no matter how small.  If you are in your 20s or 30s, you should have a clinical breast exam (CBE) by a health care provider every 1-3 years as part of a regular health exam.  If you are 40 or older, have a CBE every year. Also consider having a breast X-ray (mammogram) every year.  If you have a family history of breast cancer, talk to your health care provider about genetic screening.  If you   are at high risk for breast cancer, talk to your health care provider about having an MRI and a mammogram every year.  Breast cancer gene (BRCA) assessment is recommended for women who have family members with BRCA-related cancers. BRCA-related cancers include:  Breast.  Ovarian.  Tubal.  Peritoneal cancers.  Results of the assessment will determine the need for genetic counseling and BRCA1 and BRCA2 testing. Cervical Cancer Your health care provider may recommend that you be screened regularly for cancer of the pelvic organs (ovaries, uterus, and  vagina). This screening involves a pelvic examination, including checking for microscopic changes to the surface of your cervix (Pap test). You may be encouraged to have this screening done every 3 years, beginning at age 21.  For women ages 30-65, health care providers may recommend pelvic exams and Pap testing every 3 years, or they may recommend the Pap and pelvic exam, combined with testing for human papilloma virus (HPV), every 5 years. Some types of HPV increase your risk of cervical cancer. Testing for HPV may also be done on women of any age with unclear Pap test results.  Other health care providers may not recommend any screening for nonpregnant women who are considered low risk for pelvic cancer and who do not have symptoms. Ask your health care provider if a screening pelvic exam is right for you.  If you have had past treatment for cervical cancer or a condition that could lead to cancer, you need Pap tests and screening for cancer for at least 20 years after your treatment. If Pap tests have been discontinued, your risk factors (such as having a new sexual partner) need to be reassessed to determine if screening should resume. Some women have medical problems that increase the chance of getting cervical cancer. In these cases, your health care provider may recommend more frequent screening and Pap tests. Colorectal Cancer  This type of cancer can be detected and often prevented.  Routine colorectal cancer screening usually begins at 50 years of age and continues through 50 years of age.  Your health care provider may recommend screening at an earlier age if you have risk factors for colon cancer.  Your health care provider may also recommend using home test kits to check for hidden blood in the stool.  A small camera at the end of a tube can be used to examine your colon directly (sigmoidoscopy or colonoscopy). This is done to check for the earliest forms of colorectal  cancer.  Routine screening usually begins at age 50.  Direct examination of the colon should be repeated every 5-10 years through 50 years of age. However, you may need to be screened more often if early forms of precancerous polyps or small growths are found. Skin Cancer  Check your skin from head to toe regularly.  Tell your health care provider about any new moles or changes in moles, especially if there is a change in a mole's shape or color.  Also tell your health care provider if you have a mole that is larger than the size of a pencil eraser.  Always use sunscreen. Apply sunscreen liberally and repeatedly throughout the day.  Protect yourself by wearing long sleeves, pants, a wide-brimmed hat, and sunglasses whenever you are outside. HEART DISEASE, DIABETES, AND HIGH BLOOD PRESSURE   High blood pressure causes heart disease and increases the risk of stroke. High blood pressure is more likely to develop in:  People who have blood pressure in the high end   of the normal range (130-139/85-89 mm Hg).  People who are overweight or obese.  People who are African American.  If you are 38-23 years of age, have your blood pressure checked every 3-5 years. If you are 61 years of age or older, have your blood pressure checked every year. You should have your blood pressure measured twice--once when you are at a hospital or clinic, and once when you are not at a hospital or clinic. Record the average of the two measurements. To check your blood pressure when you are not at a hospital or clinic, you can use:  An automated blood pressure machine at a pharmacy.  A home blood pressure monitor.  If you are between 45 years and 39 years old, ask your health care provider if you should take aspirin to prevent strokes.  Have regular diabetes screenings. This involves taking a blood sample to check your fasting blood sugar level.  If you are at a normal weight and have a low risk for diabetes,  have this test once every three years after 50 years of age.  If you are overweight and have a high risk for diabetes, consider being tested at a younger age or more often. PREVENTING INFECTION  Hepatitis B  If you have a higher risk for hepatitis B, you should be screened for this virus. You are considered at high risk for hepatitis B if:  You were born in a country where hepatitis B is common. Ask your health care provider which countries are considered high risk.  Your parents were born in a high-risk country, and you have not been immunized against hepatitis B (hepatitis B vaccine).  You have HIV or AIDS.  You use needles to inject street drugs.  You live with someone who has hepatitis B.  You have had sex with someone who has hepatitis B.  You get hemodialysis treatment.  You take certain medicines for conditions, including cancer, organ transplantation, and autoimmune conditions. Hepatitis C  Blood testing is recommended for:  Everyone born from [birth year] through 1966.  Anyone with known risk factors for hepatitis C. Sexually transmitted infections (STIs)  You should be screened for sexually transmitted infections (STIs) including gonorrhea and chlamydia if:  You are sexually active and are younger than 50 years of age.  You are older than 50 years of age and your health care provider tells you that you are at risk for this type of infection.  Your sexual activity has changed since you were last screened and you are at an increased risk for chlamydia or gonorrhea. Ask your health care provider if you are at risk.  If you do not have HIV, but are at risk, it may be recommended that you take a prescription medicine daily to prevent HIV infection. This is called pre-exposure prophylaxis (PrEP). You are considered at risk if:  You are sexually active and do not regularly use condoms or know the HIV status of your partner(s).  You take drugs by injection.  You are sexually  active with a partner who has HIV. Talk with your health care provider about whether you are at high risk of being infected with HIV. If you choose to begin PrEP, you should first be tested for HIV. You should then be tested every 3 months for as long as you are taking PrEP.  PREGNANCY   If you are premenopausal and you may become pregnant, ask your health care provider about preconception counseling.  If you may  become pregnant, take 400 to 800 micrograms (mcg) of folic acid every day.  If you want to prevent pregnancy, talk to your health care provider about birth control (contraception). OSTEOPOROSIS AND MENOPAUSE   Osteoporosis is a disease in which the bones lose minerals and strength with aging. This can result in serious bone fractures. Your risk for osteoporosis can be identified using a bone density scan.  If you are 61 years of age or older, or if you are at risk for osteoporosis and fractures, ask your health care provider if you should be screened.  Ask your health care provider whether you should take a calcium or vitamin D supplement to lower your risk for osteoporosis.  Menopause may have certain physical symptoms and risks.  Hormone replacement therapy may reduce some of these symptoms and risks. Talk to your health care provider about whether hormone replacement therapy is right for you.  HOME CARE INSTRUCTIONS   Schedule regular health, dental, and eye exams.  Stay current with your immunizations.   Do not use any tobacco products including cigarettes, chewing tobacco, or electronic cigarettes.  If you are pregnant, do not drink alcohol.  If you are breastfeeding, limit how much and how often you drink alcohol.  Limit alcohol intake to no more than 1 drink per day for nonpregnant women. One drink equals 12 ounces of beer, 5 ounces of wine, or 1 ounces of hard liquor.  Do not use street drugs.  Do not share needles.  Ask your health care provider for help if  you need support or information about quitting drugs.  Tell your health care provider if you often feel depressed.  Tell your health care provider if you have ever been abused or do not feel safe at home.   This information is not intended to replace advice given to you by your health care provider. Make sure you discuss any questions you have with your health care provider.   Document Released: 06/12/2011 Document Revised: 12/18/2014 Document Reviewed: 10/29/2013 Elsevier Interactive Patient Education [employer].

## 2015-10-05 LAB — CMP14+EGFR
A/G RATIO: 1.6 (ref 1.1–2.5)
ALK PHOS: 49 IU/L (ref 39–117)
ALT: 14 IU/L (ref 0–32)
AST: 13 IU/L (ref 0–40)
Albumin: 3.6 g/dL (ref 3.5–5.5)
BUN/Creatinine Ratio: 24 — ABNORMAL HIGH (ref 9–23)
BUN: 22 mg/dL (ref 6–24)
CHLORIDE: 99 mmol/L (ref 97–106)
CO2: 23 mmol/L (ref 18–29)
Calcium: 9 mg/dL (ref 8.7–10.2)
Creatinine, Ser: 0.93 mg/dL (ref 0.57–1.00)
GFR calc Af Amer: 83 mL/min/{1.73_m2} (ref >59)
GFR calc non Af Amer: 72 mL/min/{1.73_m2} (ref >59)
Globulin, Total: 2.3 g/dL (ref 1.5–4.5)
Glucose: 72 mg/dL (ref 65–99)
Potassium: 5 mmol/L (ref 3.5–5.2)
SODIUM: 138 mmol/L (ref 136–144)
Total Protein: 5.9 g/dL — ABNORMAL LOW (ref 6.0–8.5)

## 2015-10-05 LAB — LIPID PANEL
CHOL/HDL RATIO: 3 ratio (ref 0.0–4.4)
Cholesterol, Total: 142 mg/dL (ref 100–199)
HDL: 48 mg/dL (ref >39)
LDL Calculated: 51 mg/dL (ref 0–99)
TRIGLYCERIDES: 216 mg/dL — AB (ref 0–149)
VLDL Cholesterol Cal: 43 mg/dL — ABNORMAL HIGH (ref 5–40)

## 2015-10-07 ENCOUNTER — Telehealth: Payer: Self-pay | Admitting: Nurse Practitioner

## 2015-10-08 NOTE — Telephone Encounter (Signed)
LM with husband, to have her call us

## 2015-10-12 ENCOUNTER — Telehealth: Payer: Self-pay

## 2015-10-12 MED ORDER — MELOXICAM 15 MG PO TABS: 15.0000 mg | ORAL_TABLET | Freq: Every day | ORAL | Status: DC

## 2015-10-12 NOTE — Telephone Encounter (Signed)
Patient aware of results.

## 2015-10-12 NOTE — Telephone Encounter (Signed)
See previous telephone call

## 2015-10-12 NOTE — Telephone Encounter (Signed)
Arthrotec changed to mobic- rx sent to pharmacy

## 2015-10-12 NOTE — Telephone Encounter (Signed)
Insurance denied Arthrotec   Must try two alternatives: Ibuprofen, Meloxicam, naproxen, diclofenac sodium sr

## 2015-11-29 ENCOUNTER — Other Ambulatory Visit: Payer: Self-pay | Admitting: Nurse Practitioner

## 2015-12-27 ENCOUNTER — Other Ambulatory Visit: Payer: Self-pay | Admitting: Nurse Practitioner

## 2015-12-28 NOTE — Telephone Encounter (Signed)
Last seen 10/04/15  MMM   

## 2016-01-11 ENCOUNTER — Ambulatory Visit: Payer: BLUE CROSS/BLUE SHIELD | Admitting: Nurse Practitioner

## 2016-01-12 ENCOUNTER — Other Ambulatory Visit: Payer: Self-pay | Admitting: Nurse Practitioner

## 2016-01-12 NOTE — Telephone Encounter (Signed)
Patient has appointment on 2/13

## 2016-01-12 NOTE — Telephone Encounter (Signed)
Last refill without being seen 

## 2016-01-20 ENCOUNTER — Other Ambulatory Visit: Payer: Self-pay | Admitting: Nurse Practitioner

## 2016-01-20 NOTE — Telephone Encounter (Signed)
Last refill without being seen 

## 2016-01-20 NOTE — Telephone Encounter (Signed)
Last seen and filled 10/04/15

## 2016-01-20 NOTE — Telephone Encounter (Signed)
lmovm that refill has been sent in, ntbs w/n next 2 mos before another refill

## 2016-01-21 NOTE — Telephone Encounter (Signed)
lmovm that written Rx is at front desk ready for pickkup

## 2016-01-21 NOTE — Telephone Encounter (Signed)
rx ready for pick up Please let her know of new pain policy

## 2016-01-24 ENCOUNTER — Ambulatory Visit: Payer: BLUE CROSS/BLUE SHIELD | Admitting: Nurse Practitioner

## 2016-04-04 ENCOUNTER — Other Ambulatory Visit: Payer: Self-pay | Admitting: Nurse Practitioner

## 2016-04-04 NOTE — Telephone Encounter (Signed)
Last refill without being seen 

## 2016-04-25 ENCOUNTER — Encounter: Payer: Self-pay | Admitting: Nurse Practitioner

## 2016-04-25 ENCOUNTER — Ambulatory Visit (INDEPENDENT_AMBULATORY_CARE_PROVIDER_SITE_OTHER): Payer: BLUE CROSS/BLUE SHIELD | Admitting: Nurse Practitioner

## 2016-04-25 ENCOUNTER — Ambulatory Visit (INDEPENDENT_AMBULATORY_CARE_PROVIDER_SITE_OTHER): Payer: BLUE CROSS/BLUE SHIELD

## 2016-04-25 VITALS — BP 101/68 | HR 84 | Temp 97.6°F | Ht 62.0 in | Wt 144.0 lb

## 2016-04-25 DIAGNOSIS — E785 Hyperlipidemia, unspecified: Secondary | ICD-10-CM | POA: Diagnosis not present

## 2016-04-25 DIAGNOSIS — M5136 Other intervertebral disc degeneration, lumbar region: Secondary | ICD-10-CM

## 2016-04-25 DIAGNOSIS — Z0000 Encounter for general adult medical examination without abnormal findings: Secondary | ICD-10-CM | POA: Diagnosis not present

## 2016-04-25 DIAGNOSIS — F410 Panic disorder [episodic paroxysmal anxiety] without agoraphobia: Secondary | ICD-10-CM

## 2016-04-25 DIAGNOSIS — Z6826 Body mass index (BMI) 26.0-26.9, adult: Secondary | ICD-10-CM | POA: Diagnosis not present

## 2016-04-25 DIAGNOSIS — Z01419 Encounter for gynecological examination (general) (routine) without abnormal findings: Secondary | ICD-10-CM

## 2016-04-25 DIAGNOSIS — R569 Unspecified convulsions: Secondary | ICD-10-CM

## 2016-04-25 DIAGNOSIS — K219 Gastro-esophageal reflux disease without esophagitis: Secondary | ICD-10-CM

## 2016-04-25 DIAGNOSIS — I10 Essential (primary) hypertension: Secondary | ICD-10-CM

## 2016-04-25 DIAGNOSIS — G4700 Insomnia, unspecified: Secondary | ICD-10-CM

## 2016-04-25 DIAGNOSIS — E119 Type 2 diabetes mellitus without complications: Secondary | ICD-10-CM

## 2016-04-25 LAB — BAYER DCA HB A1C WAIVED: HB A1C (BAYER DCA - WAIVED): 5.4 % (ref <7.0)

## 2016-04-25 MED ORDER — LISINOPRIL-HYDROCHLOROTHIAZIDE 20-25 MG PO TABS: 1.0000 | ORAL_TABLET | Freq: Every day | ORAL | Status: DC

## 2016-04-25 MED ORDER — SIMVASTATIN 40 MG PO TABS: 40.0000 mg | ORAL_TABLET | Freq: Every day | ORAL | Status: DC

## 2016-04-25 MED ORDER — ZOLPIDEM TARTRATE 10 MG PO TABS: 10.0000 mg | ORAL_TABLET | Freq: Every evening | ORAL | Status: DC | PRN

## 2016-04-25 MED ORDER — CARBAMAZEPINE ER 400 MG PO TB12: 400.0000 mg | ORAL_TABLET | Freq: Two times a day (BID) | ORAL | Status: DC

## 2016-04-25 MED ORDER — BUSPIRONE HCL 15 MG PO TABS: 15.0000 mg | ORAL_TABLET | Freq: Two times a day (BID) | ORAL | Status: DC | PRN

## 2016-04-25 MED ORDER — METFORMIN HCL 500 MG PO TABS: 500.0000 mg | ORAL_TABLET | Freq: Two times a day (BID) | ORAL | Status: DC

## 2016-04-25 MED ORDER — CARVEDILOL 6.25 MG PO TABS: 6.2500 mg | ORAL_TABLET | Freq: Two times a day (BID) | ORAL | Status: DC

## 2016-04-25 NOTE — Progress Notes (Signed)
Subjective:    Patient ID: Ellen Delgado, female    DOB: November 18, 1966, 50 y.o.   MRN: [id number]  Patient here today for annual physical exam and  follow up of chronic medical problems.  Outpatient Encounter Prescriptions as of 04/25/2016  Medication Sig  . aspirin EC 81 MG tablet Take 1 tablet (81 mg total) by mouth daily.  . busPIRone (BUSPAR) 15 MG tablet Take 1 tablet (15 mg total) by mouth 2 (two) times daily as needed. Every 12 hours as needed  . carbamazepine (TEGRETOL XR) 400 MG 12 hr tablet Take 1 tablet (400 mg total) by mouth 2 (two) times daily.  . carvedilol (COREG) 6.25 MG tablet Take 1 tablet (6.25 mg total) by mouth 2 (two) times daily with a meal.  . cyclobenzaprine (FLEXERIL) 10 MG tablet TAKE 1 TABLET THREE TIMES DAILY AS NEEDED FOR MUSCLE SPASMS.  . cyclobenzaprine (FLEXERIL) 10 MG tablet TAKE 1 TABLET THREE TIMES DAILY AS NEEDED FOR MUSCLE SPASMS.  Marland Kitchen lisinopril-hydrochlorothiazide (PRINZIDE,ZESTORETIC) 20-25 MG tablet TAKE 1 TABLET ONCE DAILY.  Marland Kitchen lisinopril-hydrochlorothiazide (PRINZIDE,ZESTORETIC) 20-25 MG tablet TAKE 1 TABLET ONCE DAILY.  . meloxicam (MOBIC) 15 MG tablet TAKE 1 TABLET ONCE DAILY.  . metFORMIN (GLUCOPHAGE) 500 MG tablet Take 1 tablet (500 mg total) by mouth 2 (two) times daily with a meal.  . simvastatin (ZOCOR) 40 MG tablet Take 1 tablet (40 mg total) by mouth at bedtime.  . traMADol (ULTRAM) 50 MG tablet TAKE ONE TABLET TWICE A DAY.  . traZODone (DESYREL) 50 MG tablet TAKE 1/2 TO 1 TABLET AT BEDTIME AS NEEDED FOR SLEEP .   No facility-administered encounter medications on file as of 04/25/2016.       Hypertension This is a chronic problem. The current episode started more than 1 year ago. The problem is unchanged. The problem is controlled. Pertinent negatives include no chest pain, headaches, neck pain or shortness of breath. Risk factors for coronary artery disease include diabetes mellitus, dyslipidemia, obesity and post-menopausal state. Past  treatments include beta blockers, ACE inhibitors and diuretics. The current treatment provides significant improvement. Compliance problems include diet and exercise.   Hyperlipidemia This is a chronic problem. The current episode started more than 1 year ago. The problem is controlled. Exacerbating diseases include diabetes and obesity. She has no history of hypothyroidism. Pertinent negatives include no chest pain or shortness of breath. Current antihyperlipidemic treatment includes statins. The current treatment provides significant improvement of lipids. Compliance problems include adherence to diet and adherence to exercise.  Risk factors for coronary artery disease include diabetes mellitus, dyslipidemia, hypertension, obesity and post-menopausal.  Diabetes She presents for her follow-up diabetic visit. She has type 2 diabetes mellitus. No MedicAlert identification noted. Her disease course has been stable. Hypoglycemia symptoms include seizures. Pertinent negatives for hypoglycemia include no headaches. Pertinent negatives for diabetes include no chest pain, no polydipsia, no polyphagia, no polyuria, no visual change and no weakness. Risk factors for coronary artery disease include diabetes mellitus, dyslipidemia, hypertension, obesity and post-menopausal. Current diabetic treatment includes oral agent (monotherapy). Her weight is stable. She has not had a previous visit with a dietitian. She rarely participates in exercise. Her breakfast blood glucose is taken between 8-9 am. Her breakfast blood glucose range is generally 110-130 mg/dl. Her overall blood glucose range is 110-130 mg/dl. An ACE inhibitor/angiotensin II receptor blocker is being taken. She does not see a podiatrist.Eye exam is current.  Seizures  This is a chronic problem. The problem has not changed since  onset.There was 1 seizure. The most recent episode lasted less than 30 seconds. Pertinent negatives include no headaches and no chest  pain.  GAD/ Panic attacks Buspar as directed. Helps a lot with her anxiety. Chronic back pain/DDD Uses flexeril 2 x a day and needs refill of meds Jerrye Bushy Currently on no meds- no recent symptoms insomnia Discussed at last visit- was started on trazadone- did nit help with sleep  Review of Systems  Constitutional: Negative.   HENT: Negative.   Respiratory: Negative for shortness of breath.   Cardiovascular: Negative for chest pain.  Endocrine: Negative for polydipsia, polyphagia and polyuria.  Musculoskeletal: Negative for neck pain.  Neurological: Positive for seizures. Negative for weakness and headaches.  Psychiatric/Behavioral: Negative.   All other systems reviewed and are negative.      Objective:   Physical Exam  Constitutional: She is oriented to person, place, and time. She appears well-developed and well-nourished.  HENT:  Head: Normocephalic.  Right Ear: Hearing, tympanic membrane, external ear and ear canal normal.  Left Ear: Hearing, tympanic membrane, external ear and ear canal normal.  Nose: Nose normal.  Mouth/Throat: Uvula is midline and oropharynx is clear and moist.  Soft spot about 6cm on scalp with bone deformity.  Eyes: Conjunctivae and EOM are normal. Pupils are equal, round, and reactive to light.  Neck: Normal range of motion and full passive range of motion without pain. Neck supple. No JVD present. Carotid bruit is not present. No thyroid mass and no thyromegaly present.  Cardiovascular: Normal rate, normal heart sounds and intact distal pulses.   No murmur heard. Pulmonary/Chest: Effort normal and breath sounds normal. Right breast exhibits no inverted nipple, no mass, no nipple discharge, no skin change and no tenderness. Left breast exhibits no inverted nipple, no mass, no nipple discharge, no skin change and no tenderness.  Abdominal: Soft. Bowel sounds are normal. She exhibits no mass. There is no tenderness.  Genitourinary: Vagina normal and uterus  normal. No breast swelling, tenderness, discharge or bleeding.  bimanual exam-No adnexal masses or tenderness. Cervix nonparous and pink- no discharge  Musculoskeletal: Normal range of motion.  scoliosis  Lymphadenopathy:    She has no cervical adenopathy.  Neurological: She is alert and oriented to person, place, and time.  Skin: Skin is warm and dry.  Psychiatric: She has a normal mood and affect. Her behavior is normal. Judgment and thought content normal.    BP 101/68 mmHg  Pulse 84  Temp(Src) 97.6 F (36.4 C) (Oral)  Ht _0  (1.575 m)  Wt 144 lb (65.318 kg)  BMI 26.33 kg/m2  HGBA1C 5.4% down from 5.8% from last visit  EKG- NSR-Mary-Margaret Hassell Done, FNP  Chest x ray- no cardiopulmonary disease noted-Preliminary reading by Ronnald Collum, FNP  [facility]     Assessment & Plan:  1. Annual physical exam - Urinalysis, Complete - CBC with Differential/Platelet - Thyroid Panel With TSH - VITAMIN D 25 Hydroxy (Vit-D Deficiency, Fractures)  2. Encounter for routine gynecological examination - Pap IG w/ reflex to HPV when ASC-U  3. Type 2 diabetes mellitus without complication, without long-term current use of insulin (HCC) Continue to watch carbs in diet - Bayer DCA Hb A1c Waived - metFORMIN (GLUCOPHAGE) 500 MG tablet; Take 1 tablet (500 mg total) by mouth 2 (two) times daily with a meal.  Dispense: 60 tablet; Refill: 5  4. Essential hypertension Do not add salt to diet - CMP14+EGFR - EKG 12-Lead - DG Chest 2 View; Future - carvedilol (  COREG) 6.25 MG tablet; Take 1 tablet (6.25 mg total) by mouth 2 (two) times daily with a meal.  Dispense: 60 tablet; Refill: 5 - lisinopril-hydrochlorothiazide (PRINZIDE,ZESTORETIC) 20-25 MG tablet; Take 1 tablet by mouth daily.  Dispense: 90 tablet; Refill: 1  5. Hyperlipidemia Low fat diet - Lipid panel - simvastatin (ZOCOR) 40 MG tablet; Take 1 tablet (40 mg total) by mouth at bedtime.  Dispense: 30 tablet; Refill: 5  6. Gastroesophageal  reflux disease without esophagitis Avoid spicy foods Do not eat 2 hours prior to bedtime  7. DDD (degenerative disc disease), lumbar  8. Seizures (HCC) - carbamazepine (TEGRETOL XR) 400 MG 12 hr tablet; Take 1 tablet (400 mg total) by mouth 2 (two) times daily.  Dispense: 60 tablet; Refill: 5  9. Panic attacks Stress management - busPIRone (BUSPAR) 15 MG tablet; Take 1 tablet (15 mg total) by mouth 2 (two) times daily as needed. Every 12 hours as needed  Dispense: 60 tablet; Refill: 5  10. BMI 26.0-26.9,adult Discussed diet and exercise for person with BMI >25 Will recheck weight in 3-6 months   11. Insomnia bedtime ritual - zolpidem (AMBIEN) 10 MG tablet; Take 1 tablet (10 mg total) by mouth at bedtime as needed for sleep.  Dispense: 30 tablet; Refill: 1    Labs pending Health maintenance reviewed Diet and exercise encouraged Continue all meds Follow up  In 6 month   [place], FNP

## 2016-04-25 NOTE — Patient Instructions (Signed)
Insomnia Insomnia is a sleep disorder that makes it difficult to fall asleep or to stay asleep. Insomnia can cause tiredness (fatigue), low energy, difficulty concentrating, mood swings, and poor performance at work or school.  There are three different ways to classify insomnia:  Difficulty falling asleep.  Difficulty staying asleep.  Waking up too early in the morning. Any type of insomnia can be long-term (chronic) or short-term (acute). Both are common. Short-term insomnia usually lasts for three months or less. Chronic insomnia occurs at least three times a week for longer than three months. CAUSES  Insomnia may be caused by another condition, situation, or substance, such as:  Anxiety.  Certain medicines.  Gastroesophageal reflux disease (GERD) or other gastrointestinal conditions.  Asthma or other breathing conditions.  Restless legs syndrome, sleep apnea, or other sleep disorders.  Chronic pain.  Menopause. This may include hot flashes.  Stroke.  Abuse of alcohol, tobacco, or illegal drugs.  Depression.  Caffeine.   Neurological disorders, such as Alzheimer disease.  An overactive thyroid (hyperthyroidism). The cause of insomnia may not be known. RISK FACTORS Risk factors for insomnia include:  Gender. Women are more commonly affected than men.  Age. Insomnia is more common as you get older.  Stress. This may involve your professional or personal life.  Income. Insomnia is more common in people with lower income.  Lack of exercise.   Irregular work schedule or night shifts.  Traveling between different time zones. SIGNS AND SYMPTOMS If you have insomnia, trouble falling asleep or trouble staying asleep is the main symptom. This may lead to other symptoms, such as:  Feeling fatigued.  Feeling nervous about going to sleep.  Not feeling rested in the morning.  Having trouble concentrating.  Feeling irritable, anxious, or depressed. TREATMENT   Treatment for insomnia depends on the cause. If your insomnia is caused by an underlying condition, treatment will focus on addressing the condition. Treatment may also include:   Medicines to help you sleep.  Counseling or therapy.  Lifestyle adjustments. HOME CARE INSTRUCTIONS   Take medicines only as directed by your health care provider.  Keep regular sleeping and waking hours. Avoid naps.  Keep a sleep diary to help you and your health care provider figure out what could be causing your insomnia. Include:   When you sleep.  When you wake up during the night.  How well you sleep.   How rested you feel the next day.  Any side effects of medicines you are taking.  What you eat and drink.   Make your bedroom a comfortable place where it is easy to fall asleep:  Put up shades or special blackout curtains to block light from outside.  Use a white noise machine to block noise.  Keep the temperature cool.   Exercise regularly as directed by your health care provider. Avoid exercising right before bedtime.  Use relaxation techniques to manage stress. Ask your health care provider to suggest some techniques that may work well for you. These may include:  Breathing exercises.  Routines to release muscle tension.  Visualizing peaceful scenes.  Cut back on alcohol, caffeinated beverages, and cigarettes, especially close to bedtime. These can disrupt your sleep.  Do not overeat or eat spicy foods right before bedtime. This can lead to digestive discomfort that can make it hard for you to sleep.  Limit screen use before bedtime. This includes:  Watching TV.  Using your smartphone, tablet, and computer.  Stick to a routine. This   can help you fall asleep faster. Try to do a quiet activity, brush your teeth, and go to bed at the same time each night.  Get out of bed if you are still awake after 15 minutes of trying to sleep. Keep the lights down, but try reading or  doing a quiet activity. When you feel sleepy, go back to bed.  Make sure that you drive carefully. Avoid driving if you feel very sleepy.  Keep all follow-up appointments as directed by your health care provider. This is important. SEEK MEDICAL CARE IF:   You are tired throughout the day or have trouble in your daily routine due to sleepiness.  You continue to have sleep problems or your sleep problems get worse. SEEK IMMEDIATE MEDICAL CARE IF:   You have serious thoughts about hurting yourself or someone else.   This information is not intended to replace advice given to you by your health care provider. Make sure you discuss any questions you have with your health care provider.   Document Released: 11/24/2000 Document Revised: 08/18/2015 Document Reviewed: 08/28/2014 Elsevier Interactive Patient Education 2016 [employer] Inc.  

## 2016-04-26 ENCOUNTER — Other Ambulatory Visit: Payer: Self-pay | Admitting: Nurse Practitioner

## 2016-04-26 DIAGNOSIS — E785 Hyperlipidemia, unspecified: Secondary | ICD-10-CM

## 2016-04-26 DIAGNOSIS — R569 Unspecified convulsions: Secondary | ICD-10-CM

## 2016-04-26 DIAGNOSIS — E119 Type 2 diabetes mellitus without complications: Secondary | ICD-10-CM

## 2016-04-26 DIAGNOSIS — F410 Panic disorder [episodic paroxysmal anxiety] without agoraphobia: Secondary | ICD-10-CM

## 2016-04-26 DIAGNOSIS — G4700 Insomnia, unspecified: Secondary | ICD-10-CM

## 2016-04-26 DIAGNOSIS — I10 Essential (primary) hypertension: Secondary | ICD-10-CM

## 2016-04-26 MED ORDER — CARVEDILOL 6.25 MG PO TABS: 6.2500 mg | ORAL_TABLET | Freq: Two times a day (BID) | ORAL | Status: DC

## 2016-04-26 MED ORDER — METFORMIN HCL 500 MG PO TABS: 500.0000 mg | ORAL_TABLET | Freq: Two times a day (BID) | ORAL | Status: DC

## 2016-04-26 MED ORDER — CARBAMAZEPINE ER 400 MG PO TB12: 400.0000 mg | ORAL_TABLET | Freq: Two times a day (BID) | ORAL | Status: DC

## 2016-04-26 MED ORDER — SIMVASTATIN 40 MG PO TABS: 40.0000 mg | ORAL_TABLET | Freq: Every day | ORAL | Status: DC

## 2016-04-26 NOTE — Telephone Encounter (Signed)
Patient aware that I have sent all prescriptions to pharmacy except buspar and ambien. Patient aware that I will send to provider to review for 90 days.

## 2016-04-27 LAB — CBC WITH DIFFERENTIAL/PLATELET
BASOS ABS: 0 10*3/uL (ref 0.0–0.2)
BASOS: 0 %
EOS (ABSOLUTE): 0.2 10*3/uL (ref 0.0–0.4)
Eos: 3 %
HEMOGLOBIN: 12.1 g/dL (ref 11.1–15.9)
Hematocrit: 34.9 % (ref 34.0–46.6)
IMMATURE GRANS (ABS): 0 10*3/uL (ref 0.0–0.1)
IMMATURE GRANULOCYTES: 0 %
LYMPHS: 37 %
Lymphocytes Absolute: 2.7 10*3/uL (ref 0.7–3.1)
MCH: 32 pg (ref 26.6–33.0)
MCHC: 34.7 g/dL (ref 31.5–35.7)
MCV: 92 fL (ref 79–97)
MONOS ABS: 0.8 10*3/uL (ref 0.1–0.9)
Monocytes: 11 %
NEUTROS PCT: 49 %
Neutrophils Absolute: 3.6 10*3/uL (ref 1.4–7.0)
PLATELETS: 312 10*3/uL (ref 150–379)
RBC: 3.78 x10E6/uL (ref 3.77–5.28)
RDW: 13.3 % (ref 12.3–15.4)
WBC: 7.4 10*3/uL (ref 3.4–10.8)

## 2016-04-27 LAB — CMP14+EGFR
ALT: 16 IU/L (ref 0–32)
AST: 14 IU/L (ref 0–40)
Albumin/Globulin Ratio: 2 (ref 1.2–2.2)
Albumin: 4.2 g/dL (ref 3.5–5.5)
Alkaline Phosphatase: 41 IU/L (ref 39–117)
BUN/Creatinine Ratio: 24 — ABNORMAL HIGH (ref 9–23)
BUN: 15 mg/dL (ref 6–24)
Bilirubin Total: 0.2 mg/dL (ref 0.0–1.2)
CALCIUM: 9.9 mg/dL (ref 8.7–10.2)
CO2: 28 mmol/L (ref 18–29)
CREATININE: 0.63 mg/dL (ref 0.57–1.00)
Chloride: 93 mmol/L — ABNORMAL LOW (ref 96–106)
GFR, EST AFRICAN AMERICAN: 121 mL/min/{1.73_m2} (ref >59)
GFR, EST NON AFRICAN AMERICAN: 105 mL/min/{1.73_m2} (ref >59)
GLUCOSE: 101 mg/dL — AB (ref 65–99)
Globulin, Total: 2.1 g/dL (ref 1.5–4.5)
Potassium: 4.3 mmol/L (ref 3.5–5.2)
Sodium: 133 mmol/L — ABNORMAL LOW (ref 134–144)
TOTAL PROTEIN: 6.3 g/dL (ref 6.0–8.5)

## 2016-04-27 LAB — LIPID PANEL
CHOL/HDL RATIO: 2.4 ratio (ref 0.0–4.4)
Cholesterol, Total: 149 mg/dL (ref 100–199)
HDL: 62 mg/dL (ref >39)
LDL CALC: 60 mg/dL (ref 0–99)
TRIGLYCERIDES: 136 mg/dL (ref 0–149)
VLDL CHOLESTEROL CAL: 27 mg/dL (ref 5–40)

## 2016-04-27 LAB — THYROID PANEL WITH TSH
FREE THYROXINE INDEX: 1.8 (ref 1.2–4.9)
T3 Uptake Ratio: 33 % (ref 24–39)
T4, Total: 5.6 ug/dL (ref 4.5–12.0)
TSH: 1.61 u[IU]/mL (ref 0.450–4.500)

## 2016-04-27 LAB — PAP IG W/ RFLX HPV ASCU: PAP SMEAR COMMENT: 0

## 2016-04-27 LAB — VITAMIN D 25 HYDROXY (VIT D DEFICIENCY, FRACTURES): Vit D, 25-Hydroxy: 46.5 ng/mL (ref 30.0–100.0)

## 2016-04-27 MED ORDER — BUSPIRONE HCL 15 MG PO TABS: 15.0000 mg | ORAL_TABLET | Freq: Two times a day (BID) | ORAL | Status: DC | PRN

## 2016-04-27 NOTE — Telephone Encounter (Signed)
Patient aware rx was sent and she does have the ambien rx. Patient states that her insurance will be canceled by the end of June due to a divorce and wants to know if the buspar we can change to a 90 day supply with no refills so she will still have her insurance? Patient also wants to know if the Ambien can be a 90 day supply at one time as well.  Please advise

## 2016-04-27 NOTE — Telephone Encounter (Signed)
buspar was sent electornically and Remus Lofflerambien rx given to patient at appointmnet on 04/25/16

## 2016-04-27 NOTE — Telephone Encounter (Signed)
i gave her her anbien rx- tell her it was on purple paper and to look for it- cannot do 90 day suply of ambien. Will change buspar

## 2016-04-27 NOTE — Telephone Encounter (Signed)
Made several attempts and number is busy.

## 2016-05-04 ENCOUNTER — Other Ambulatory Visit: Payer: Self-pay | Admitting: Nurse Practitioner

## 2016-05-04 DIAGNOSIS — I10 Essential (primary) hypertension: Secondary | ICD-10-CM

## 2016-05-04 MED ORDER — LISINOPRIL-HYDROCHLOROTHIAZIDE 20-25 MG PO TABS: 1.0000 | ORAL_TABLET | Freq: Every day | ORAL | Status: DC

## 2016-05-04 NOTE — Telephone Encounter (Signed)
All refills done except ultram- need to know what she needs that for- NTBS for pain meds

## 2016-05-04 NOTE — Telephone Encounter (Signed)
Patient is taking for DDD

## 2016-05-04 NOTE — Telephone Encounter (Signed)
Give her 90 days of what you can, she is losing insurance

## 2016-05-05 ENCOUNTER — Telehealth: Payer: Self-pay | Admitting: Nurse Practitioner

## 2016-05-05 DIAGNOSIS — G4700 Insomnia, unspecified: Secondary | ICD-10-CM

## 2016-05-05 MED ORDER — ZOLPIDEM TARTRATE 10 MG PO TABS: 10.0000 mg | ORAL_TABLET | Freq: Every evening | ORAL | Status: DC | PRN

## 2016-05-05 NOTE — Telephone Encounter (Signed)
Please call in ambien 10 mg 1 po Qhs #90 with 0 refills

## 2016-05-05 NOTE — Telephone Encounter (Signed)
Ambien called to [facility].

## 2016-06-01 ENCOUNTER — Other Ambulatory Visit: Payer: Self-pay | Admitting: Nurse Practitioner

## 2016-06-01 MED ORDER — CYCLOBENZAPRINE HCL 10 MG PO TABS: ORAL_TABLET | ORAL | Status: DC

## 2016-06-01 NOTE — Telephone Encounter (Signed)
done

## 2016-06-05 ENCOUNTER — Other Ambulatory Visit: Payer: Self-pay | Admitting: Nurse Practitioner

## 2016-06-05 MED ORDER — CYCLOBENZAPRINE HCL 10 MG PO TABS: ORAL_TABLET | ORAL | Status: DC

## 2016-06-05 NOTE — Telephone Encounter (Signed)
Rx sent into pharmacy for a 3 month supply #270 and pt is aware.

## 2016-06-05 NOTE — Telephone Encounter (Signed)
Was done on 06/01/16

## 2016-07-03 ENCOUNTER — Ambulatory Visit: Payer: BLUE CROSS/BLUE SHIELD | Admitting: Nurse Practitioner

## 2016-07-03 ENCOUNTER — Encounter: Payer: Self-pay | Admitting: Family Medicine

## 2016-07-03 ENCOUNTER — Ambulatory Visit (INDEPENDENT_AMBULATORY_CARE_PROVIDER_SITE_OTHER): Payer: BLUE CROSS/BLUE SHIELD

## 2016-07-03 ENCOUNTER — Ambulatory Visit: Payer: BLUE CROSS/BLUE SHIELD | Admitting: Family

## 2016-07-03 ENCOUNTER — Ambulatory Visit (INDEPENDENT_AMBULATORY_CARE_PROVIDER_SITE_OTHER): Payer: BLUE CROSS/BLUE SHIELD | Admitting: Family Medicine

## 2016-07-03 VITALS — BP 132/87 | HR 84 | Temp 97.2°F | Ht 62.0 in | Wt 151.8 lb

## 2016-07-03 DIAGNOSIS — M25552 Pain in left hip: Secondary | ICD-10-CM | POA: Diagnosis not present

## 2016-07-03 DIAGNOSIS — S70912A Unspecified superficial injury of left hip, initial encounter: Secondary | ICD-10-CM | POA: Diagnosis not present

## 2016-07-03 DIAGNOSIS — T148XXA Other injury of unspecified body region, initial encounter: Secondary | ICD-10-CM

## 2016-07-03 NOTE — Progress Notes (Signed)
BP 132/87 (BP Location: Right Arm, Patient Position: Sitting, Cuff Size: Normal)   Pulse 84   Temp 97.2 F (36.2 C) (Oral)   Ht 5\' 2"  (1.575 m)   Wt 151 lb 12.8 oz (68.9 kg)   LMP 05/25/2016 (Approximate)   BMI 27.76 kg/m    Subjective:    Patient ID: Ellen Delgado, female    DOB: [birth year], 50 y.o.   MRN: [id number]  HPI: Ellen Delgado is a 50 y.o. female presenting on 07/03/2016 for Hip Pain (right hip, fell 2 weeks ago, pain radiates into leg) and Edema (in left ankle and left knee)   HPI Hip pain Patient has hip pain from a fall that happened 2 weeks ago. From the fall she had swelling and bruising on the back left side of her hip and then the swelling and bruising extended down her leg towards her knee. She still has some swelling down in that area and some bruising down in there as well. She denies any numbness or weakness in that leg. She has been having some pain with ambulation. She is able to weight-bear without any issues. She says the pain is 6 out of 10. She also feels like she has a knot in the back of her upper leg  Relevant past medical, surgical, family and social history reviewed and updated as indicated. Interim medical history since our last visit reviewed. Allergies and medications reviewed and updated.  Review of Systems  Constitutional: Negative for chills and fever.  HENT: Negative for congestion, ear discharge and ear pain.   Eyes: Negative for redness and visual disturbance.  Respiratory: Negative for chest tightness and shortness of breath.   Cardiovascular: Negative for chest pain and leg swelling.  Genitourinary: Negative for difficulty urinating and dysuria.  Musculoskeletal: Positive for arthralgias, gait problem, joint swelling and myalgias. Negative for back pain.  Skin: Negative for rash.  Neurological: Negative for light-headedness and headaches.  Psychiatric/Behavioral: Negative for agitation and behavioral problems.  All other systems  reviewed and are negative.   Per HPI unless specifically indicated above     Medication List       Accurate as of 07/03/16  4:14 PM. Always use your most recent med list.          aspirin EC 81 MG tablet Take 1 tablet (81 mg total) by mouth daily.   busPIRone 15 MG tablet Commonly known as:  BUSPAR Take 1 tablet (15 mg total) by mouth 2 (two) times daily as needed. Every 12 hours as needed   carbamazepine 400 MG 12 hr tablet Commonly known as:  TEGRETOL XR Take 1 tablet (400 mg total) by mouth 2 (two) times daily.   carvedilol 6.25 MG tablet Commonly known as:  COREG Take 1 tablet (6.25 mg total) by mouth 2 (two) times daily with a meal.   cyclobenzaprine 10 MG tablet Commonly known as:  FLEXERIL TAKE 1 TABLET THREE TIMES DAILY AS NEEDED FOR MUSCLE SPASMS.   lisinopril-hydrochlorothiazide 20-25 MG tablet Commonly known as:  PRINZIDE,ZESTORETIC Take 1 tablet by mouth daily.   meloxicam 15 MG tablet Commonly known as:  MOBIC TAKE 1 TABLET ONCE DAILY.   metFORMIN 500 MG tablet Commonly known as:  GLUCOPHAGE Take 1 tablet (500 mg total) by mouth 2 (two) times daily with a meal.   simvastatin 40 MG tablet Commonly known as:  ZOCOR Take 1 tablet (40 mg total) by mouth at bedtime.   traMADol 50 MG tablet Commonly known  as:  ULTRAM TAKE ONE TABLET TWICE A DAY.   traZODone 50 MG tablet Commonly known as:  DESYREL TAKE 1/2 TO 1 TABLET AT BEDTIME AS NEEDED FOR SLEEP .   zolpidem 10 MG tablet Commonly known as:  AMBIEN Take 1 tablet (10 mg total) by mouth at bedtime as needed for sleep.          Objective:    BP 132/87 (BP Location: Right Arm, Patient Position: Sitting, Cuff Size: Normal)   Pulse 84   Temp 97.2 F (36.2 C) (Oral)   Ht  (1.575 m)   Wt 151 lb 12.8 oz (68.9 kg)   LMP 05/25/2016 (Approximate)   BMI 27.76 kg/m   Wt Readings from Last 3 Encounters:  07/03/16 151 lb 12.8 oz (68.9 kg)  04/25/16 144 lb (65.3 kg)  10/04/15 155 lb (70.3  kg)    Physical Exam  Constitutional: She is oriented to person, place, and time. She appears well-developed and well-nourished. No distress.  Eyes: Conjunctivae and EOM are normal. Pupils are equal, round, and reactive to light.  Cardiovascular: Normal rate, regular rhythm, normal heart sounds and intact distal pulses.   No murmur heard. Pulmonary/Chest: Effort normal and breath sounds normal. No respiratory distress. She has no wheezes.  Musculoskeletal: Normal range of motion. She exhibits edema and tenderness.       Left upper leg: She exhibits tenderness and swelling.       Legs: Neurological: She is alert and oriented to person, place, and time. Coordination normal.  Skin: Skin is warm and dry. No rash noted. She is not diaphoretic.  Psychiatric: She has a normal mood and affect. Her behavior is normal.  Nursing note and vitals reviewed.      Assessment & Plan:   Problem List Items Addressed This Visit    None    Visit Diagnoses    Hematoma    -  Primary   Likely just hematoma, do warm compresses and anti-inflammatory medications   Hip pain, acute, left       2 weeks ago fell, will do x-ray to make sure nothing broken   Relevant Orders   DG HIP UNILAT W OR W/O PELVIS 2-3 VIEWS LEFT (Completed)       Follow up plan: Return if symptoms worsen or fail to improve.  Counseling provided for all of the vaccine components Orders Placed This Encounter  Procedures  . DG HIP UNILAT W OR W/O PELVIS 2-3 VIEWS LEFT    Arville Care, MD [facility] 07/03/2016, 4:14 PM

## 2016-07-04 ENCOUNTER — Telehealth: Payer: Self-pay | Admitting: Nurse Practitioner

## 2016-07-04 ENCOUNTER — Ambulatory Visit: Payer: BLUE CROSS/BLUE SHIELD | Admitting: Family Medicine

## 2016-07-04 NOTE — Telephone Encounter (Signed)
Patient aware of Xray results 

## 2016-07-06 ENCOUNTER — Ambulatory Visit: Payer: BLUE CROSS/BLUE SHIELD | Admitting: Family Medicine

## 2016-07-13 ENCOUNTER — Telehealth: Payer: Self-pay | Admitting: Nurse Practitioner

## 2016-07-13 ENCOUNTER — Other Ambulatory Visit: Payer: Self-pay | Admitting: Nurse Practitioner

## 2016-07-13 MED ORDER — CYCLOBENZAPRINE HCL 10 MG PO TABS: ORAL_TABLET | ORAL | 0 refills | Status: DC

## 2016-07-13 NOTE — Telephone Encounter (Signed)
Advised patient it was too early to get refilled and patient states that her insurance will run out next month and that is why she wanted a 3 month supply refill. Please advise

## 2016-07-13 NOTE — Telephone Encounter (Signed)
Because this is a prn medication I can only give 1 month at a time.- rx sent to pharmacy

## 2016-07-13 NOTE — Telephone Encounter (Signed)
According to chart last rx for flexeril was filled on 6/22 and was written for TID with #90 an dhas refils. Should not need this soon

## 2016-07-13 NOTE — Telephone Encounter (Signed)
Patient aware.

## 2016-07-31 ENCOUNTER — Other Ambulatory Visit: Payer: Self-pay | Admitting: Nurse Practitioner

## 2016-07-31 DIAGNOSIS — G4700 Insomnia, unspecified: Secondary | ICD-10-CM

## 2016-07-31 NOTE — Telephone Encounter (Signed)
MMM seen 04/2016- if approved send to pool B for phone in

## 2016-08-01 NOTE — Telephone Encounter (Signed)
NTBS for refill 

## 2016-08-02 NOTE — Telephone Encounter (Signed)
Patient notified that she NTBS. She advised that she would call back in about a week to make an appt

## 2016-09-02 ENCOUNTER — Other Ambulatory Visit: Payer: Self-pay | Admitting: Nurse Practitioner

## 2016-09-02 DIAGNOSIS — G4700 Insomnia, unspecified: Secondary | ICD-10-CM

## 2016-09-04 NOTE — Telephone Encounter (Signed)
Please call in ambien with 1 refills 

## 2016-09-11 ENCOUNTER — Telehealth: Payer: Self-pay | Admitting: Nurse Practitioner

## 2016-09-12 NOTE — Telephone Encounter (Signed)
We do not do disability letters. Patient will need to be seen if needs me to do something.

## 2016-09-13 NOTE — Telephone Encounter (Signed)
Pt notified she will need to be seen Will call back to schedule

## 2016-09-14 ENCOUNTER — Encounter: Payer: Self-pay | Admitting: Nurse Practitioner

## 2016-09-14 ENCOUNTER — Ambulatory Visit (INDEPENDENT_AMBULATORY_CARE_PROVIDER_SITE_OTHER): Payer: BLUE CROSS/BLUE SHIELD | Admitting: Nurse Practitioner

## 2016-09-14 VITALS — BP 124/75 | HR 76 | Temp 97.4°F | Ht 62.0 in | Wt 152.8 lb

## 2016-09-14 DIAGNOSIS — F410 Panic disorder [episodic paroxysmal anxiety] without agoraphobia: Secondary | ICD-10-CM

## 2016-09-14 DIAGNOSIS — M4124 Other idiopathic scoliosis, thoracic region: Secondary | ICD-10-CM

## 2016-09-14 DIAGNOSIS — M5136 Other intervertebral disc degeneration, lumbar region: Secondary | ICD-10-CM

## 2016-09-14 NOTE — Progress Notes (Signed)
   Subjective:    Patient ID: Ellen Delgado, female    DOB: April 04, 1966, 50 y.o.   MRN: [id number]  HPI Patient comes in today to discuss her disability. She is currently getting food stamps. She is trying to get disability for several years- she says that she has back problems, scoliosis and DDD. And panic attacks. She has been denied 3 x and is waiting on hearing. In order to get food stamps she needs a letter stating that she is disabled. SHe needs a letter stating that she is unable to work.    Review of Systems  Constitutional: Negative.   HENT: Negative.   Respiratory: Negative.   Cardiovascular: Negative.   Gastrointestinal: Negative.   Genitourinary: Negative.   Neurological: Negative.   Psychiatric/Behavioral: Negative.   All other systems reviewed and are negative.      Objective:   Physical Exam  Constitutional: She appears well-developed and well-nourished. No distress.  Cardiovascular: Normal rate and regular rhythm.   Pulmonary/Chest: Effort normal.  Musculoskeletal:  Gait steady FROM of lumbar spine with pain on flexion and extension Motor strength and sensation distally intact  Neurological: She is alert. She has normal reflexes.  Skin: Skin is warm.  Psychiatric: She has a normal mood and affect. Her behavior is normal. Judgment and thought content normal.   BP 124/75 (BP Location: Left Arm, Patient Position: Sitting, Cuff Size: Normal)   Pulse 76   Temp 97.4 F (36.3 C) (Oral)   Ht 5\' 2"  (1.575 m)   Wt 152 lb 12.8 oz (69.3 kg)   BMI 27.95 kg/m         Assessment & Plan:   1. Panic attacks   2. Other idiopathic scoliosis, thoracic region   3. DDD (degenerative disc disease), lumbar    Letter written for disability so can continue to get food stamps RTO prn  Mary-Margaret Daphine DeutscherMartin, FNP

## 2016-09-18 ENCOUNTER — Telehealth: Payer: Self-pay | Admitting: Nurse Practitioner

## 2016-09-18 MED ORDER — CARBAMAZEPINE 200 MG PO TABS: 200.0000 mg | ORAL_TABLET | Freq: Four times a day (QID) | ORAL | 0 refills | Status: DC

## 2016-09-18 NOTE — Telephone Encounter (Signed)
Per pt's pharmacist, Tegretol XR is too expensive Can this be changed to plain Tegretol and will be dose need to be increased Pt no longer has insurance Please review and advise

## 2016-09-18 NOTE — Telephone Encounter (Signed)
Discussed with Tammy, pharmacist:  To switch to something that is cheapest it is recommended to use carbamazepine 200mg  (regular release) - take 200mg  qid with food (breakfast, lunch, supper and bedtime).  Per her pharmacy #[id number] = 30 days would be $70.

## 2016-10-11 ENCOUNTER — Telehealth: Payer: Self-pay | Admitting: Nurse Practitioner

## 2016-10-12 NOTE — Telephone Encounter (Signed)
Printed out records and put up front

## 2016-10-25 NOTE — Telephone Encounter (Signed)
Spoke with Ms. Ellen Delgado and explained that chart has been requested from [facility] and as soon as records are ready we will give her a call.

## 2016-10-26 ENCOUNTER — Ambulatory Visit: Payer: BLUE CROSS/BLUE SHIELD | Admitting: Nurse Practitioner

## 2016-10-27 NOTE — Telephone Encounter (Signed)
Notes copied and notified patient they are ready to pick up

## 2016-11-22 ENCOUNTER — Telehealth: Payer: Self-pay | Admitting: Nurse Practitioner

## 2016-11-22 NOTE — Telephone Encounter (Signed)
Patient wanted to know the instructions on how to take her Tegretol 200mg . Told patient to take it 4 times a day at each meal and bed time like rx stated. Patient verbilizes understanding.

## 2016-11-27 ENCOUNTER — Other Ambulatory Visit: Payer: Self-pay | Admitting: Nurse Practitioner

## 2016-11-27 DIAGNOSIS — E785 Hyperlipidemia, unspecified: Secondary | ICD-10-CM

## 2016-11-29 ENCOUNTER — Other Ambulatory Visit: Payer: Self-pay | Admitting: Nurse Practitioner

## 2016-11-29 DIAGNOSIS — I10 Essential (primary) hypertension: Secondary | ICD-10-CM

## 2017-01-10 ENCOUNTER — Other Ambulatory Visit: Payer: Self-pay | Admitting: Pediatrics

## 2017-02-07 ENCOUNTER — Other Ambulatory Visit: Payer: Self-pay | Admitting: Nurse Practitioner

## 2017-02-07 DIAGNOSIS — F410 Panic disorder [episodic paroxysmal anxiety] without agoraphobia: Secondary | ICD-10-CM

## 2017-02-07 DIAGNOSIS — G4700 Insomnia, unspecified: Secondary | ICD-10-CM

## 2017-02-07 DIAGNOSIS — E785 Hyperlipidemia, unspecified: Secondary | ICD-10-CM

## 2017-02-07 NOTE — Telephone Encounter (Signed)
Last seen OCT 2017- MMM If approved route to nurse for the [place]

## 2017-02-07 NOTE — Telephone Encounter (Signed)
Please call in ambien with 0 refills Last refill without being seen  

## 2017-02-18 ENCOUNTER — Other Ambulatory Visit: Payer: Self-pay | Admitting: Nurse Practitioner

## 2017-02-18 DIAGNOSIS — I10 Essential (primary) hypertension: Secondary | ICD-10-CM

## 2017-02-18 DIAGNOSIS — E785 Hyperlipidemia, unspecified: Secondary | ICD-10-CM

## 2017-04-06 ENCOUNTER — Telehealth: Payer: Self-pay | Admitting: Nurse Practitioner

## 2017-04-06 NOTE — Telephone Encounter (Signed)
Patient states she does not have money to come in and would like her medication sent to the pharmacy for a 90 day supply. Patient was last seen 09/14/16. Is requesting a refill on carvedilol, metformin and carbamazeprine. Patient states that those are the main three she wants but would also like buspar, flexeril and ambien as well. Please advise

## 2017-04-06 NOTE — Telephone Encounter (Signed)
Pt was called and we will have someone from billing contact her about payment options.

## 2017-04-06 NOTE — Telephone Encounter (Signed)
I am sorry but due to patients medical problems she has to be sen for med refills- has not been seen since last octoer and needs blood work

## 2017-04-13 ENCOUNTER — Ambulatory Visit (INDEPENDENT_AMBULATORY_CARE_PROVIDER_SITE_OTHER): Payer: Self-pay | Admitting: Nurse Practitioner

## 2017-04-13 ENCOUNTER — Encounter: Payer: Self-pay | Admitting: Nurse Practitioner

## 2017-04-13 VITALS — BP 120/79 | HR 73 | Temp 97.0°F | Ht 62.0 in | Wt 150.0 lb

## 2017-04-13 DIAGNOSIS — E119 Type 2 diabetes mellitus without complications: Secondary | ICD-10-CM

## 2017-04-13 DIAGNOSIS — I10 Essential (primary) hypertension: Secondary | ICD-10-CM

## 2017-04-13 DIAGNOSIS — K219 Gastro-esophageal reflux disease without esophagitis: Secondary | ICD-10-CM

## 2017-04-13 DIAGNOSIS — F5101 Primary insomnia: Secondary | ICD-10-CM

## 2017-04-13 DIAGNOSIS — F410 Panic disorder [episodic paroxysmal anxiety] without agoraphobia: Secondary | ICD-10-CM

## 2017-04-13 DIAGNOSIS — L659 Nonscarring hair loss, unspecified: Secondary | ICD-10-CM

## 2017-04-13 DIAGNOSIS — R569 Unspecified convulsions: Secondary | ICD-10-CM

## 2017-04-13 DIAGNOSIS — M5136 Other intervertebral disc degeneration, lumbar region: Secondary | ICD-10-CM

## 2017-04-13 DIAGNOSIS — Z6827 Body mass index (BMI) 27.0-27.9, adult: Secondary | ICD-10-CM

## 2017-04-13 DIAGNOSIS — E785 Hyperlipidemia, unspecified: Secondary | ICD-10-CM

## 2017-04-13 DIAGNOSIS — E7800 Pure hypercholesterolemia, unspecified: Secondary | ICD-10-CM

## 2017-04-13 LAB — BAYER DCA HB A1C WAIVED: HB A1C: 5.5 % (ref <7.0)

## 2017-04-13 MED ORDER — LISINOPRIL-HYDROCHLOROTHIAZIDE 20-25 MG PO TABS: 1.0000 | ORAL_TABLET | Freq: Every day | ORAL | 1 refills | Status: DC

## 2017-04-13 MED ORDER — CARBAMAZEPINE 200 MG PO TABS: ORAL_TABLET | ORAL | 1 refills | Status: DC

## 2017-04-13 MED ORDER — SIMVASTATIN 40 MG PO TABS: ORAL_TABLET | ORAL | 1 refills | Status: DC

## 2017-04-13 MED ORDER — BUSPIRONE HCL 15 MG PO TABS: ORAL_TABLET | ORAL | 1 refills | Status: DC

## 2017-04-13 MED ORDER — CARVEDILOL 6.25 MG PO TABS: ORAL_TABLET | ORAL | 1 refills | Status: DC

## 2017-04-13 MED ORDER — ZOLPIDEM TARTRATE 10 MG PO TABS: ORAL_TABLET | ORAL | 1 refills | Status: DC

## 2017-04-13 NOTE — Progress Notes (Signed)
Subjective:    Patient ID: Ellen Delgado, female    DOB: [birth year], 51 y.o.   MRN: [id number]  HPI  Ellen Delgado is here today for follow up of chronic medical problem.  Outpatient Encounter Prescriptions as of 04/13/2017  Medication Sig  . aspirin EC 81 MG tablet Take 1 tablet (81 mg total) by mouth daily.  . busPIRone (BUSPAR) 15 MG tablet TAKE (1) TABLET TWICE A DAY AS NEEDED.  . calcium-vitamin D (OSCAL WITH D) 500-200 MG-UNIT tablet Take 3 tablets by mouth daily.  . carbamazepine (TEGRETOL) 200 MG tablet TAKE 1 TABLET FOUR TIMES DAILY, WITH MEALS AND AT BEDTIME .  Marland Kitchen carvedilol (COREG) 6.25 MG tablet TAKE 1 TABLET TWICE DAILY WITH A MEAL.  . cyclobenzaprine (FLEXERIL) 10 MG tablet TAKE 1 TABLET THREE TIMES DAILY AS NEEDED FOR MUSCLE SPASMS.  Marland Kitchen lisinopril-hydrochlorothiazide (PRINZIDE,ZESTORETIC) 20-25 MG tablet Take 1 tablet by mouth daily.  . metFORMIN (GLUCOPHAGE) 500 MG tablet Take 1 tablet (500 mg total) by mouth 2 (two) times daily with a meal.  . Multiple Vitamin (MULTIVITAMIN) tablet Take 1 tablet by mouth daily.  . simvastatin (ZOCOR) 40 MG tablet TAKE (1) TABLET DAILY AT BEDTIME.  . traMADol (ULTRAM) 50 MG tablet TAKE ONE TABLET TWICE A DAY.  Marland Kitchen zolpidem (AMBIEN) 10 MG tablet TAKE 1 TABLET ONCE DAILY AT BEDTIME FOR SLEEP .     1. Essential hypertension  No c/o chest pain,SOB or Headache  2. Type 2 diabetes mellitus without complication, without long-term current use of insulin (HCC) last hgba1c 5.4% - doe snot chek blood sugars at home  3. Hyperlipidemia, unspecified hyperlipidemia type  Does not watch diet  4. Hair loss   still losing hair- seems to be getting really thin  5. Gastroesophageal reflux disease without esophagitis   currently no ton anything- has not had symptoms as of late  6. DDD (degenerative disc disease), lumbar  Has back pain usually in the mornings  7. BMI 26.0-26.9,adult  Has gained 8 lbs since last visit  8. Panic attacks   has  occasional panic attacks- uses buspar which helps  9. Seizures ([place])   currently on tegratol- last seizures was 20 year sago  10.    insomnia          Takes ambien nightly- cannot go to sleep when she does not take  New complaints: None today     Review of Systems  Constitutional: Negative for diaphoresis.  Eyes: Negative for pain.  Respiratory: Negative for shortness of breath.   Cardiovascular: Negative for chest pain, palpitations and leg swelling.  Gastrointestinal: Negative for abdominal pain.  Endocrine: Negative for polydipsia.  Skin: Negative for rash.  Neurological: Negative for dizziness, weakness and headaches.  Hematological: Does not bruise/bleed easily.       Objective:   Physical Exam  Constitutional: She is oriented to person, place, and time. She appears well-developed and well-nourished.  HENT:  Nose: Nose normal.  Mouth/Throat: Oropharynx is clear and moist.  Eyes: EOM are normal.  Neck: Trachea normal, normal range of motion and full passive range of motion without pain. Neck supple. No JVD present. Carotid bruit is not present. No thyromegaly present.  Cardiovascular: Normal rate, regular rhythm, normal heart sounds and intact distal pulses.  Exam reveals no gallop and no friction rub.   No murmur heard. Pulmonary/Chest: Effort normal and breath sounds normal.  Abdominal: Soft. Bowel sounds are normal. She exhibits no distension and no mass. There is  no tenderness.  Musculoskeletal: Normal range of motion.  Lymphadenopathy:    She has no cervical adenopathy.  Neurological: She is alert and oriented to person, place, and time. She has normal reflexes.  Skin: Skin is warm and dry.  Psychiatric: She has a normal mood and affect. Her behavior is normal. Judgment and thought content normal.   BP 120/79   Pulse 73   Temp 97 F (36.1 C) (Oral)   Ht '5\' 2"'  (1.575 m)   Wt 150 lb (68 kg)   BMI 27.44 kg/m   hgba1c 5.5%    Assessment & Plan:  1.  Essential hypertension Low sodium diet - CMP14+EGFR - lisinopril-hydrochlorothiazide (PRINZIDE,ZESTORETIC) 20-25 MG tablet; Take 1 tablet by mouth daily.  Dispense: 90 tablet; Refill: 1 - carvedilol (COREG) 6.25 MG tablet; TAKE 1 TABLET TWICE DAILY WITH A MEAL.  Dispense: 60 tablet; Refill: 1  2. Type 2 diabetes mellitus without complication, without long-term current use of insulin (HCC) Decrease metformin to 1/2 tablet daily Keep diary of blood sugars - Bayer DCA Hb A1c Waived - Microalbumin / creatinine urine ratio  3. Hyperlipidemia, unspecified hyperlipidemia type Low fat diet - Lipid panel  4. Hair loss Labs pending - Thyroid Panel With TSH  5. Gastroesophageal reflux disease without esophagitis Avoid spicy foods Do not eat 2 hours prior to bedtime  6. DDD (degenerative disc disease), lumbar  7. BMI 27.0-27.9,adult Discussed diet and exercise for person with BMI >25 Will recheck weight in 3-6 months  8. Panic attacks Stress management - busPIRone (BUSPAR) 15 MG tablet; TAKE (1) TABLET TWICE A DAY AS NEEDED.  Dispense: 180 tablet; Refill: 1  9. Seizures (HCC) - carbamazepine (TEGRETOL) 200 MG tablet; TAKE 1 TABLET FOUR TIMES DAILY, WITH MEALS AND AT BEDTIME .  Dispense: 360 tablet; Refill: 1  10. Pure hypercholesterolemia Low fat diet - simvastatin (ZOCOR) 40 MG tablet; TAKE (1) TABLET DAILY AT BEDTIME.  Dispense: 90 tablet; Refill: 1  11. Primary insomnia Bedtime routine - zolpidem (AMBIEN) 10 MG tablet; TAKE 1 TABLET ONCE DAILY AT BEDTIME FOR SLEEP .  Dispense: 90 tablet; Refill: 1    Labs pending Health maintenance reviewed Diet and exercise encouraged Continue all meds Follow up  In 6 months   [place], FNP

## 2017-04-13 NOTE — Patient Instructions (Signed)

## 2017-04-14 LAB — CMP14+EGFR
A/G RATIO: 1.5 (ref 1.2–2.2)
ALT: 24 IU/L (ref 0–32)
AST: 20 IU/L (ref 0–40)
Albumin: 4.1 g/dL (ref 3.5–5.5)
Alkaline Phosphatase: 75 IU/L (ref 39–117)
BUN / CREAT RATIO: 20 (ref 9–23)
BUN: 14 mg/dL (ref 6–24)
CHLORIDE: 98 mmol/L (ref 96–106)
CO2: 27 mmol/L (ref 18–29)
Calcium: 10.2 mg/dL (ref 8.7–10.2)
Creatinine, Ser: 0.69 mg/dL (ref 0.57–1.00)
GFR calc non Af Amer: 101 mL/min/{1.73_m2} (ref >59)
GFR, EST AFRICAN AMERICAN: 117 mL/min/{1.73_m2} (ref >59)
Globulin, Total: 2.7 g/dL (ref 1.5–4.5)
Glucose: 103 mg/dL — ABNORMAL HIGH (ref 65–99)
POTASSIUM: 4.1 mmol/L (ref 3.5–5.2)
SODIUM: 140 mmol/L (ref 134–144)
TOTAL PROTEIN: 6.8 g/dL (ref 6.0–8.5)

## 2017-04-14 LAB — THYROID PANEL WITH TSH
Free Thyroxine Index: 1.7 (ref 1.2–4.9)
T3 UPTAKE RATIO: 30 % (ref 24–39)
T4, Total: 5.8 ug/dL (ref 4.5–12.0)
TSH: 1.65 u[IU]/mL (ref 0.450–4.500)

## 2017-04-14 LAB — MICROALBUMIN / CREATININE URINE RATIO
Creatinine, Urine: 29.1 mg/dL
Microalbumin, Urine: <3 ug/mL

## 2017-04-14 LAB — LIPID PANEL
Chol/HDL Ratio: 3 ratio (ref 0.0–4.4)
Cholesterol, Total: 180 mg/dL (ref 100–199)
HDL: 61 mg/dL (ref >39)
LDL Calculated: 97 mg/dL (ref 0–99)
Triglycerides: 110 mg/dL (ref 0–149)
VLDL Cholesterol Cal: 22 mg/dL (ref 5–40)

## 2017-04-16 ENCOUNTER — Telehealth: Payer: Self-pay

## 2017-04-16 NOTE — Telephone Encounter (Signed)
Called patient with lab results. Thyroid was normal. Patient still losing hair. Its coming out in large chunks per patient. What should she do next?

## 2017-04-16 NOTE — Telephone Encounter (Signed)
Lab results reviewed with patient.

## 2017-04-16 NOTE — Telephone Encounter (Signed)
Not much she can do other than see dermatologist and she does not have any insurance

## 2017-04-20 ENCOUNTER — Ambulatory Visit (INDEPENDENT_AMBULATORY_CARE_PROVIDER_SITE_OTHER): Payer: Self-pay

## 2017-04-20 ENCOUNTER — Other Ambulatory Visit: Payer: Self-pay | Admitting: Nurse Practitioner

## 2017-04-20 ENCOUNTER — Encounter: Payer: Self-pay | Admitting: Nurse Practitioner

## 2017-04-20 ENCOUNTER — Ambulatory Visit (INDEPENDENT_AMBULATORY_CARE_PROVIDER_SITE_OTHER): Payer: Self-pay | Admitting: Nurse Practitioner

## 2017-04-20 VITALS — BP 144/79 | HR 81 | Temp 97.6°F | Ht 62.0 in | Wt 153.0 lb

## 2017-04-20 DIAGNOSIS — Z13828 Encounter for screening for other musculoskeletal disorder: Secondary | ICD-10-CM

## 2017-04-20 DIAGNOSIS — M419 Scoliosis, unspecified: Secondary | ICD-10-CM

## 2017-04-20 DIAGNOSIS — G8929 Other chronic pain: Secondary | ICD-10-CM

## 2017-04-20 DIAGNOSIS — F330 Major depressive disorder, recurrent, mild: Secondary | ICD-10-CM

## 2017-04-20 DIAGNOSIS — M545 Low back pain: Secondary | ICD-10-CM

## 2017-04-20 MED ORDER — CITALOPRAM HYDROBROMIDE 20 MG PO TABS: 20.0000 mg | ORAL_TABLET | Freq: Every day | ORAL | 5 refills | Status: DC

## 2017-04-20 NOTE — Progress Notes (Signed)
   Subjective:    Patient ID: Ellen Delgado, female    DOB: 08-19-1966, 51 y.o.   MRN: [id number]  HPI Patient comes in today with several complaints: - She says that she was diagnosed with scoliosis many years ago. SHe is trying to get disability and says that she needs a repeat xray to determine if it has worsened. She says that she has back pain when she is active and that pain resolves with rest. Right ow rats pain 2/10. - says that she is depressed because she lives with her parents and they have to pay for everything because she has no money coming in. [facility] 04/20/2017 04/13/2017 07/03/2016 04/25/2016 10/04/2015  Decreased Interest 3 0 0 0 0  Down, Depressed, Hopeless 3 0 - 0 0  PHQ - 2 Score 6 0 0 0 0  Altered sleeping 3 - - - -  Tired, decreased energy 3 - - - -  Change in appetite 2 - - - -  Feeling bad or failure about yourself  3 - - - -  Trouble concentrating 2 - - - -  Moving slowly or fidgety/restless 3 - - - -  Suicidal thoughts 0 - - - -  PHQ-9 Score 22 - - - -  Difficult doing work/chores Somewhat difficult - - - -    - does not take blood pressure pill everyday because she has no insurance to get .   Review of Systems  Respiratory: Negative.   Cardiovascular: Negative.   Musculoskeletal: Positive for back pain.  Neurological: Negative.   Psychiatric/Behavioral: Negative.   All other systems reviewed and are negative.      Objective:   Physical Exam  Constitutional: She appears well-developed and well-nourished. No distress.  Cardiovascular: Normal rate and regular rhythm.   Pulmonary/Chest: Effort normal and breath sounds normal.  Musculoskeletal:  Gait steady without abnormality Rises from sitting to standing without prblems   BP (!) 148/94   Pulse 84   Temp 97.6 F (36.4 C) (Oral)   Ht 5\' 2"  (1.575 m)   Wt 153 lb (69.4 kg)   BMI 27.98 kg/m   Back x ray- very mild scoliosis-Preliminary reading by Paulene FloorMary Raynell Scott, FNP  [facility]       Assessment & Plan:  1. Mild episode of recurrent major depressive disorder (HCC) Stress management - citalopram (CELEXA) 20 MG tablet; Take 1 tablet (20 mg total) by mouth daily.  Dispense: 30 tablet; Refill: 5  2. Scoliosis, unspecified scoliosis type, unspecified spinal region 3. Chronic bilateral low back pain without sciatica Moist heat to back Back strengthening exercises RTO prn  Mary-Margaret Daphine DeutscherMartin, FNP

## 2017-04-20 NOTE — Patient Instructions (Signed)
Back Pain, Adult  Back pain is very common. The pain often gets better over time. The cause of back pain is usually not dangerous. Most people can learn to manage their back pain on their own.  Follow these instructions at home:  Watch your back pain for any changes. The following actions may help to lessen any pain you are feeling:  · Stay active. Start with short walks on flat ground if you can. Try to walk farther each day.  · Exercise regularly as told by your doctor. Exercise helps your back heal faster. It also helps avoid future injury by keeping your muscles strong and flexible.  · Do not sit, drive, or stand in one place for more than 30 minutes.  · Do not stay in bed. Resting more than 1-2 days can slow down your recovery.  · Be careful when you bend or lift an object. Use good form when lifting:  ? Bend at your knees.  ? Keep the object close to your body.  ? Do not twist.  · Sleep on a firm mattress. Lie on your side, and bend your knees. If you lie on your back, put a pillow under your knees.  · Take medicines only as told by your doctor.  · Put ice on the injured area.  ? Put ice in a plastic bag.  ? Place a towel between your skin and the bag.  ? Leave the ice on for 20 minutes, 2-3 times a day for the first 2-3 days. After that, you can switch between ice and heat packs.  · Avoid feeling anxious or stressed. Find good ways to deal with stress, such as exercise.  · Maintain a healthy weight. Extra weight puts stress on your back.    Contact a doctor if:  · You have pain that does not go away with rest or medicine.  · You have worsening pain that goes down into your legs or buttocks.  · You have pain that does not get better in one week.  · You have pain at night.  · You lose weight.  · You have a fever or chills.  Get help right away if:  · You cannot control when you poop (bowel movement) or pee (urinate).  · Your arms or legs feel weak.  · Your arms or legs lose feeling (numbness).  · You feel sick  to your stomach (nauseous) or throw up (vomit).  · You have belly (abdominal) pain.  · You feel like you may pass out (faint).  This information is not intended to replace advice given to you by your health care provider. Make sure you discuss any questions you have with your health care provider.  Document Released: 05/15/2008 Document Revised: 05/04/2016 Document Reviewed: 03/31/2014  Elsevier Interactive Patient Education © 2017 Elsevier Inc.

## 2017-05-04 ENCOUNTER — Telehealth: Payer: Self-pay | Admitting: Nurse Practitioner

## 2017-05-04 NOTE — Telephone Encounter (Signed)
Attempted to contact patient - NA °

## 2017-05-09 NOTE — Telephone Encounter (Signed)
No response from patient to discuss x-ray.

## 2017-05-17 ENCOUNTER — Other Ambulatory Visit: Payer: Self-pay | Admitting: Nurse Practitioner

## 2017-05-17 DIAGNOSIS — E119 Type 2 diabetes mellitus without complications: Secondary | ICD-10-CM

## 2017-05-25 ENCOUNTER — Other Ambulatory Visit: Payer: Self-pay | Admitting: Nurse Practitioner

## 2017-06-17 ENCOUNTER — Other Ambulatory Visit: Payer: Self-pay | Admitting: Nurse Practitioner

## 2017-06-17 DIAGNOSIS — I10 Essential (primary) hypertension: Secondary | ICD-10-CM

## 2017-06-26 ENCOUNTER — Other Ambulatory Visit: Payer: Self-pay | Admitting: Nurse Practitioner

## 2017-06-26 ENCOUNTER — Other Ambulatory Visit: Payer: Self-pay | Admitting: Family

## 2017-06-26 DIAGNOSIS — F5101 Primary insomnia: Secondary | ICD-10-CM

## 2017-06-27 NOTE — Telephone Encounter (Signed)
Last filled 05/25/17, last seen 04/23/17. Call in

## 2017-06-27 NOTE — Telephone Encounter (Signed)
[facility]

## 2017-06-27 NOTE — Telephone Encounter (Signed)
Please call in ambien with 1 refills 

## 2017-09-12 ENCOUNTER — Other Ambulatory Visit: Payer: Self-pay | Admitting: Nurse Practitioner

## 2017-09-12 DIAGNOSIS — F5101 Primary insomnia: Secondary | ICD-10-CM

## 2017-09-12 NOTE — Telephone Encounter (Signed)
Last seen 04/20/17  MMM   If approved route to nurse to call into [facility] [phone]

## 2017-10-11 ENCOUNTER — Other Ambulatory Visit: Payer: Self-pay | Admitting: Nurse Practitioner

## 2017-10-11 DIAGNOSIS — I10 Essential (primary) hypertension: Secondary | ICD-10-CM

## 2017-10-12 NOTE — Telephone Encounter (Signed)
Last seen 04/20/17  MMM 

## 2017-10-15 ENCOUNTER — Encounter: Payer: Self-pay | Admitting: Nurse Practitioner

## 2017-10-15 ENCOUNTER — Ambulatory Visit (INDEPENDENT_AMBULATORY_CARE_PROVIDER_SITE_OTHER): Payer: Medicaid Other | Admitting: Nurse Practitioner

## 2017-10-15 VITALS — BP 147/91 | HR 72 | Temp 97.0°F | Ht 62.0 in | Wt 162.0 lb

## 2017-10-15 DIAGNOSIS — K219 Gastro-esophageal reflux disease without esophagitis: Secondary | ICD-10-CM

## 2017-10-15 DIAGNOSIS — I10 Essential (primary) hypertension: Secondary | ICD-10-CM | POA: Diagnosis not present

## 2017-10-15 DIAGNOSIS — Z6826 Body mass index (BMI) 26.0-26.9, adult: Secondary | ICD-10-CM

## 2017-10-15 DIAGNOSIS — F330 Major depressive disorder, recurrent, mild: Secondary | ICD-10-CM

## 2017-10-15 DIAGNOSIS — R569 Unspecified convulsions: Secondary | ICD-10-CM

## 2017-10-15 DIAGNOSIS — M5136 Other intervertebral disc degeneration, lumbar region: Secondary | ICD-10-CM

## 2017-10-15 DIAGNOSIS — E119 Type 2 diabetes mellitus without complications: Secondary | ICD-10-CM

## 2017-10-15 DIAGNOSIS — M51369 Other intervertebral disc degeneration, lumbar region without mention of lumbar back pain or lower extremity pain: Secondary | ICD-10-CM

## 2017-10-15 DIAGNOSIS — F5101 Primary insomnia: Secondary | ICD-10-CM

## 2017-10-15 DIAGNOSIS — E7800 Pure hypercholesterolemia, unspecified: Secondary | ICD-10-CM

## 2017-10-15 DIAGNOSIS — F410 Panic disorder [episodic paroxysmal anxiety] without agoraphobia: Secondary | ICD-10-CM

## 2017-10-15 LAB — BAYER DCA HB A1C WAIVED: HB A1C (BAYER DCA - WAIVED): 5.9 % (ref <7.0)

## 2017-10-15 MED ORDER — CITALOPRAM HYDROBROMIDE 20 MG PO TABS: 20.0000 mg | ORAL_TABLET | Freq: Every day | ORAL | 5 refills | Status: DC

## 2017-10-15 MED ORDER — LISINOPRIL-HYDROCHLOROTHIAZIDE 20-25 MG PO TABS: 1.0000 | ORAL_TABLET | Freq: Every day | ORAL | 1 refills | Status: DC

## 2017-10-15 MED ORDER — CARBAMAZEPINE 200 MG PO TABS: ORAL_TABLET | ORAL | 1 refills | Status: DC

## 2017-10-15 MED ORDER — ZOLPIDEM TARTRATE 10 MG PO TABS: ORAL_TABLET | ORAL | 2 refills | Status: DC

## 2017-10-15 MED ORDER — SIMVASTATIN 40 MG PO TABS: ORAL_TABLET | ORAL | 1 refills | Status: DC

## 2017-10-15 MED ORDER — CYCLOBENZAPRINE HCL 10 MG PO TABS: ORAL_TABLET | ORAL | 1 refills | Status: DC

## 2017-10-15 MED ORDER — CARVEDILOL 6.25 MG PO TABS: ORAL_TABLET | ORAL | 3 refills | Status: DC

## 2017-10-15 MED ORDER — BUSPIRONE HCL 15 MG PO TABS: ORAL_TABLET | ORAL | 1 refills | Status: DC

## 2017-10-15 MED ORDER — METFORMIN HCL 500 MG PO TABS: ORAL_TABLET | ORAL | 1 refills | Status: DC

## 2017-10-15 NOTE — Patient Instructions (Signed)

## 2017-10-15 NOTE — Progress Notes (Signed)
Subjective:    Patient ID: Ellen Delgado, female    DOB: 10-Jul-1966, 51 y.o.   MRN: [id number]  HPI   Ellen Delgado is here today for follow up of chronic medical problem.  Outpatient Encounter Medications as of 10/15/2017  Medication Sig  . aspirin EC 81 MG tablet Take 1 tablet (81 mg total) by mouth daily.  . busPIRone (BUSPAR) 15 MG tablet TAKE (1) TABLET TWICE A DAY AS NEEDED.  . calcium-vitamin D (OSCAL WITH D) 500-200 MG-UNIT tablet Take 3 tablets by mouth daily.  . carbamazepine (TEGRETOL) 200 MG tablet TAKE 1 TABLET FOUR TIMES DAILY, WITH MEALS AND AT BEDTIME .  Marland Kitchen carvedilol (COREG) 6.25 MG tablet TAKE 1 TABLET TWICE DAILY WITH A MEAL.  . citalopram (CELEXA) 20 MG tablet Take 1 tablet (20 mg total) by mouth daily.  . cyclobenzaprine (FLEXERIL) 10 MG tablet TAKE 1 TABLET THREE TIMES DAILY AS NEEDED FOR MUSCLE SPASMS.  Marland Kitchen lisinopril-hydrochlorothiazide (PRINZIDE,ZESTORETIC) 20-25 MG tablet Take 1 tablet by mouth daily.  . metFORMIN (GLUCOPHAGE) 500 MG tablet TAKE 1 TABLET TWICE DAILY WITH A MEAL.  . Multiple Vitamin (MULTIVITAMIN) tablet Take 1 tablet by mouth daily.  . simvastatin (ZOCOR) 40 MG tablet TAKE (1) TABLET DAILY AT BEDTIME.  . traMADol (ULTRAM) 50 MG tablet TAKE ONE TABLET TWICE A DAY.  Marland Kitchen zolpidem (AMBIEN) 10 MG tablet TAKE 1 TABLET ONCE DAILY AT BEDTIME FOR SLEEP .   No facility-administered encounter medications on file as of 10/15/2017.     1. Type 2 diabetes mellitus without complication, without long-term current use of insulin ([place]) last hgba1c was 5.5. Patient does not check her blood sugars ery often. No hypoglycemic symptoms that she is aware of.  2. Hyperlipidemia, unspecified hyperlipidemia type  Does not really watch diet  3. Essential hypertension  No c/o chest pain, sob or headache. Doe snot check blood pressures at home  4. Gastroesophageal reflux disease without esophagitis  Is currently on no rx meds. Still has occasional symptoms  5. DDD  (degenerative disc disease), lumbar  Has frequent back pain. She uses flexeril when needed which really seems to help  6. Seizures ([place])  Is still on tegretol- no seizure activity in awhile. Has ot seen neurologist in several years.  7. Panic attacks  Patient is on buspar bid and is doing well  8. BMI 26.0-26.9,adult  No recent weight changes    New complaints: None  today  Social history: Her and her husbands are divorced and lives with her parents. She was seeing someone and he passed away in 09/16/23.    Review of Systems  Constitutional: Negative for activity change and appetite change.  HENT: Negative.   Eyes: Negative for pain.  Respiratory: Negative for shortness of breath.   Cardiovascular: Negative for chest pain, palpitations and leg swelling.  Gastrointestinal: Negative for abdominal pain.  Endocrine: Negative for polydipsia.  Genitourinary: Negative.   Skin: Negative for rash.  Neurological: Negative for dizziness, weakness and headaches.  Hematological: Does not bruise/bleed easily.  Psychiatric/Behavioral: Negative.   All other systems reviewed and are negative.      Objective:   Physical Exam  Constitutional: She is oriented to person, place, and time. She appears well-developed and well-nourished.  HENT:  Nose: Nose normal.  Mouth/Throat: Oropharynx is clear and moist.  Eyes: EOM are normal.  Neck: Trachea normal, normal range of motion and full passive range of motion without pain. Neck supple. No JVD present. Carotid bruit is  not present. No thyromegaly present.  Cardiovascular: Normal rate, regular rhythm, normal heart sounds and intact distal pulses. Exam reveals no gallop and no friction rub.  No murmur heard. Pulmonary/Chest: Effort normal and breath sounds normal.  Abdominal: Soft. Bowel sounds are normal. She exhibits no distension and no mass. There is no tenderness.  Musculoskeletal: Normal range of motion.  Lymphadenopathy:    She has no  cervical adenopathy.  Neurological: She is alert and oriented to person, place, and time. She has normal reflexes.  Skin: Skin is warm and dry.  Psychiatric: She has a normal mood and affect. Her behavior is normal. Judgment and thought content normal.   BP (!) 147/91   Pulse 72   Temp (!) 97 F (36.1 C) (Oral)   Ht '5\' 2"'  (1.575 m)   Wt 162 lb (73.5 kg)   BMI 29.63 kg/m      Assessment & Plan:  1. Type 2 diabetes mellitus without complication, without long-term current use of insulin (HCC) Continue to watch carbs in diet - Bayer DCA Hb A1c Waived - metFORMIN (GLUCOPHAGE) 500 MG tablet; TAKE 1 TABLET TWICE DAILY WITH A MEAL.  Dispense: 180 tablet; Refill: 1  2. Pure cholesterolemia Low fat diet - Lipid panel- simvastatin (ZOCOR) 40 MG tablet; TAKE (1) TABLET DAILY AT BEDTIME.  Dispense: 90 tablet; Refill: 1   3. Essential hypertension Low sodium diet - CMP14+EGFR - lisinopril-hydrochlorothiazide (PRINZIDE,ZESTORETIC) 20-25 MG tablet; Take 1 tablet daily by mouth.  Dispense: 90 tablet; Refill: 1 - carvedilol (COREG) 6.25 MG tablet; TAKE 1 TABLET TWICE DAILY WITH A MEAL.  Dispense: 60 tablet; Refill: 3  4. Gastroesophageal reflux disease without esophagitis Avoid spicy foods Do not eat 2 hours prior to bedtime  5. DDD (degenerative disc disease), lumbar Is trying to get disability- was told wil probably be very difficult to get if has not seen specialist for back pain.  6. Seizures ([place]) Needs to see neurologist - carbamazepine (TEGRETOL) 200 MG tablet; TAKE 1 TABLET FOUR TIMES DAILY, WITH MEALS AND AT BEDTIME .  Dispense: 360 tablet; Refill: 1  7. Panic attacks Stress management - busPIRone (BUSPAR) 15 MG tablet; TAKE (1) TABLET TWICE A DAY AS NEEDED.  Dispense: 180 tablet; Refill: 1  8. BMI 26.0-26.9,adult Discussed diet and exercise for person with BMI >25 Will recheck weight in 3-6 months  9. Mild episode of recurrent major depressive disorder (HCC) Stress  management - citalopram (CELEXA) 20 MG tablet; Take 1 tablet (20 mg total) daily by mouth.  Dispense: 30 tablet; Refill: 5  10. Primary insomnia Bedtime routine - zolpidem (AMBIEN) 10 MG tablet; TAKE 1 TABLET ONCE DAILY AT BEDTIME FOR SLEEP .  Dispense: 30 tablet; Refill: 2      Labs pending Health maintenance reviewed Diet and exercise encouraged Continue all meds Follow up  In 3 months   [place], FNP

## 2017-10-16 LAB — CMP14+EGFR
ALK PHOS: 91 IU/L (ref 39–117)
ALT: 21 IU/L (ref 0–32)
AST: 21 IU/L (ref 0–40)
Albumin/Globulin Ratio: 1.6 (ref 1.2–2.2)
Albumin: 4.2 g/dL (ref 3.5–5.5)
BILIRUBIN TOTAL: 0.2 mg/dL (ref 0.0–1.2)
BUN / CREAT RATIO: 25 — AB (ref 9–23)
BUN: 17 mg/dL (ref 6–24)
CHLORIDE: 99 mmol/L (ref 96–106)
CO2: 24 mmol/L (ref 20–29)
CREATININE: 0.67 mg/dL (ref 0.57–1.00)
Calcium: 10.6 mg/dL — ABNORMAL HIGH (ref 8.7–10.2)
GFR calc Af Amer: 117 mL/min/{1.73_m2} (ref >59)
GFR calc non Af Amer: 101 mL/min/{1.73_m2} (ref >59)
GLUCOSE: 98 mg/dL (ref 65–99)
Globulin, Total: 2.6 g/dL (ref 1.5–4.5)
Potassium: 4.6 mmol/L (ref 3.5–5.2)
Sodium: 139 mmol/L (ref 134–144)
Total Protein: 6.8 g/dL (ref 6.0–8.5)

## 2017-10-16 LAB — LIPID PANEL
CHOLESTEROL TOTAL: 214 mg/dL — AB (ref 100–199)
Chol/HDL Ratio: 3 ratio (ref 0.0–4.4)
HDL: 72 mg/dL (ref >39)
LDL CALC: 118 mg/dL — AB (ref 0–99)
TRIGLYCERIDES: 119 mg/dL (ref 0–149)
VLDL CHOLESTEROL CAL: 24 mg/dL (ref 5–40)

## 2018-01-21 ENCOUNTER — Other Ambulatory Visit: Payer: Self-pay | Admitting: Family Medicine

## 2018-01-21 DIAGNOSIS — F5101 Primary insomnia: Secondary | ICD-10-CM

## 2018-01-22 ENCOUNTER — Ambulatory Visit (INDEPENDENT_AMBULATORY_CARE_PROVIDER_SITE_OTHER): Payer: Medicaid Other | Admitting: Nurse Practitioner

## 2018-01-22 ENCOUNTER — Encounter: Payer: Self-pay | Admitting: Nurse Practitioner

## 2018-01-22 VITALS — BP 121/79 | HR 77 | Temp 97.0°F | Ht 62.0 in | Wt 165.0 lb

## 2018-01-22 DIAGNOSIS — K219 Gastro-esophageal reflux disease without esophagitis: Secondary | ICD-10-CM

## 2018-01-22 DIAGNOSIS — M5136 Other intervertebral disc degeneration, lumbar region: Secondary | ICD-10-CM | POA: Diagnosis not present

## 2018-01-22 DIAGNOSIS — R569 Unspecified convulsions: Secondary | ICD-10-CM

## 2018-01-22 DIAGNOSIS — F410 Panic disorder [episodic paroxysmal anxiety] without agoraphobia: Secondary | ICD-10-CM

## 2018-01-22 DIAGNOSIS — E785 Hyperlipidemia, unspecified: Secondary | ICD-10-CM

## 2018-01-22 DIAGNOSIS — I10 Essential (primary) hypertension: Secondary | ICD-10-CM

## 2018-01-22 DIAGNOSIS — E119 Type 2 diabetes mellitus without complications: Secondary | ICD-10-CM

## 2018-01-22 DIAGNOSIS — Z6826 Body mass index (BMI) 26.0-26.9, adult: Secondary | ICD-10-CM

## 2018-01-22 LAB — BAYER DCA HB A1C WAIVED: HB A1C: 5.3 % (ref <7.0)

## 2018-01-22 NOTE — Addendum Note (Signed)
Addended by: Bennie PieriniMARTIN, MARY-MARGARET on: 01/22/2018 08:44 AM   Modules accepted: Level of Service

## 2018-01-22 NOTE — Progress Notes (Signed)
Subjective:    Patient ID: Ellen AdasCindy B Dowding, female    DOB: [birth year], 52 y.o.   MRN: [id number]  HPI   Ellen Delgado is here today for follow up of chronic medical problem.  Outpatient Encounter Medications as of 01/22/2018  Medication Sig  . aspirin EC 81 MG tablet Take 1 tablet (81 mg total) by mouth daily.  . busPIRone (BUSPAR) 15 MG tablet TAKE (1) TABLET TWICE A DAY AS NEEDED.  . calcium-vitamin D (OSCAL WITH D) 500-200 MG-UNIT tablet Take 3 tablets by mouth daily.  . carbamazepine (TEGRETOL) 200 MG tablet TAKE 1 TABLET FOUR TIMES DAILY, WITH MEALS AND AT BEDTIME .  Marland Kitchen. carvedilol (COREG) 6.25 MG tablet TAKE 1 TABLET TWICE DAILY WITH A MEAL.  . citalopram (CELEXA) 20 MG tablet Take 1 tablet (20 mg total) daily by mouth.  . cyclobenzaprine (FLEXERIL) 10 MG tablet TAKE 1 TABLET THREE TIMES DAILY AS NEEDED FOR MUSCLE SPASMS.  Marland Kitchen. lisinopril-hydrochlorothiazide (PRINZIDE,ZESTORETIC) 20-25 MG tablet Take 1 tablet daily by mouth.  . metFORMIN (GLUCOPHAGE) 500 MG tablet TAKE 1 TABLET TWICE DAILY WITH A MEAL.  . Multiple Vitamin (MULTIVITAMIN) tablet Take 1 tablet by mouth daily.  . simvastatin (ZOCOR) 40 MG tablet TAKE (1) TABLET DAILY AT BEDTIME.  . traMADol (ULTRAM) 50 MG tablet TAKE ONE TABLET TWICE A DAY.  Marland Kitchen. zolpidem (AMBIEN) 10 MG tablet TAKE 1 TABLET AT BEDTIME IF NEEDED FOR SLEEP .     1. Essential hypertension  No c/o chest pain, sob or headache. Does not check blood pressure at home. BP Readings from Last 3 Encounters:  10/15/17 (!) 147/91  04/20/17 (!) 144/79  04/13/17 120/79     2. Gastroesophageal reflux disease without esophagitis  Patient has bad reflux but just uses OTC meds an including tums for relief.  3. Type 2 diabetes mellitus without complication, without long-term current use of insulin (HCC) last HGBA1C was 5.5%. She really does not watch her diet  4. DDD (degenerative disc disease), lumbar  She has chronic back pain in which she use to take ultram for. She  currently takes nothing and says that the back pain prevents her from being able to work.  5. BMI 26.0-26.9,adult  No recent weight changes  6. Hyperlipidemia, unspecified hyperlipidemia type  Not watching diet  7. Panic attacks  Stays nervous all the time. She is currently on buspar to keep herself calm.  8. Seizures (HCC)  Is on tegretol but has not had a seizure in many years    New complaints: None today  Social history: Her and her husband are estranged and she currently lives with her parents.    Review of Systems  Constitutional: Negative for activity change and appetite change.  HENT: Negative.   Eyes: Negative for pain.  Respiratory: Negative for shortness of breath.   Cardiovascular: Negative for chest pain, palpitations and leg swelling.  Gastrointestinal: Negative for abdominal pain.  Endocrine: Negative for polydipsia.  Genitourinary: Negative.   Skin: Negative for rash.  Neurological: Negative for dizziness, weakness and headaches.  Hematological: Does not bruise/bleed easily.  Psychiatric/Behavioral: Negative.   All other systems reviewed and are negative.      Objective:   Physical Exam  Constitutional: She is oriented to person, place, and time. She appears well-developed and well-nourished.  HENT:  Nose: Nose normal.  Mouth/Throat: Oropharynx is clear and moist.  Eyes: EOM are normal.  Neck: Trachea normal, normal range of motion and full passive range of motion without  pain. Neck supple. No JVD present. Carotid bruit is not present. No thyromegaly present.  Cardiovascular: Normal rate, regular rhythm, normal heart sounds and intact distal pulses. Exam reveals no gallop and no friction rub.  No murmur heard. Pulmonary/Chest: Effort normal and breath sounds normal.  Abdominal: Soft. Bowel sounds are normal. She exhibits no distension and no mass. There is no tenderness.  Musculoskeletal: Normal range of motion.  Lymphadenopathy:    She has no  cervical adenopathy.  Neurological: She is alert and oriented to person, place, and time. She has normal reflexes.  Skin: Skin is warm and dry.  Psychiatric: She has a normal mood and affect. Her behavior is normal. Judgment and thought content normal.   BP 121/79   Pulse 77   Temp (!) 97 F (36.1 C) (Oral)   Ht 5\' 2"  (1.575 m)   Wt 165 lb (74.8 kg)   BMI 30.18 kg/m   HGBA1c 5.3%    Assessment & Plan:  1. Essential hypertension Low sodium diet  2. Gastroesophageal reflux disease without esophagitis Avoid spicy foods Do not eat 2 hours prior to bedtime  3. Type 2 diabetes mellitus without complication, without long-term current use of insulin (HCC) Continue to watchcarbsin diet - Bayer DCA Hb A1c Waived  4. DDD (degenerative disc disease), lumbar Back stretches  5. BMI 26.0-26.9,adult Discussed diet and exercise for person with BMI >25 Will recheck weight in 3-6 months  6. Hyperlipidemia, unspecified hyperlipidemia type Low fat diet  7. Panic attacks Continue celexa as rx and buspar as needed  8. Seizures (HCC) Keep follow up appointment with neurology    Labs pending Health maintenance reviewed Diet and exercise encouraged Continue all meds Follow up  In 6 months   Mary-Margaret Daphine Deutscher, FNP

## 2018-01-22 NOTE — Patient Instructions (Signed)
Stress and Stress Management Stress is a normal reaction to life events. It is what you feel when life demands more than you are used to or more than you can handle. Some stress can be useful. For example, the stress reaction can help you catch the last bus of the day, study for a test, or meet a deadline at work. But stress that occurs too often or for too long can cause problems. It can affect your emotional health and interfere with relationships and normal daily activities. Too much stress can weaken your immune system and increase your risk for physical illness. If you already have a medical problem, stress can make it worse. What are the causes? All sorts of life events may cause stress. An event that causes stress for one person may not be stressful for another person. Major life events commonly cause stress. These may be positive or negative. Examples include losing your job, moving into a new home, getting married, having a baby, or losing a loved one. Less obvious life events may also cause stress, especially if they occur day after day or in combination. Examples include working long hours, driving in traffic, caring for children, being in debt, or being in a difficult relationship. What are the signs or symptoms? Stress may cause emotional symptoms including, the following:  Anxiety. This is feeling worried, afraid, on edge, overwhelmed, or out of control.  Anger. This is feeling irritated or impatient.  Depression. This is feeling sad, down, helpless, or guilty.  Difficulty focusing, remembering, or making decisions.  Stress may cause physical symptoms, including the following:  Aches and pains. These may affect your head, neck, back, stomach, or other areas of your body.  Tight muscles or clenched jaw.  Low energy or trouble sleeping.  Stress may cause unhealthy behaviors, including the following:  Eating to feel better (overeating) or skipping meals.  Sleeping too little,  too much, or both.  Working too much or putting off tasks (procrastination).  Smoking, drinking alcohol, or using drugs to feel better.  How is this diagnosed? Stress is diagnosed through an assessment by your health care provider. Your health care provider will ask questions about your symptoms and any stressful life events.Your health care provider will also ask about your medical history and may order blood tests or other tests. Certain medical conditions and medicine can cause physical symptoms similar to stress. Mental illness can cause emotional symptoms and unhealthy behaviors similar to stress. Your health care provider may refer you to a mental health professional for further evaluation. How is this treated? Stress management is the recommended treatment for stress.The goals of stress management are reducing stressful life events and coping with stress in healthy ways. Techniques for reducing stressful life events include the following:  Stress identification. Self-monitor for stress and identify what causes stress for you. These skills may help you to avoid some stressful events.  Time management. Set your priorities, keep a calendar of events, and learn to say "no." These tools can help you avoid making too many commitments.  Techniques for coping with stress include the following:  Rethinking the problem. Try to think realistically about stressful events rather than ignoring them or overreacting. Try to find the positives in a stressful situation rather than focusing on the negatives.  Exercise. Physical exercise can release both physical and emotional tension. The key is to find a form of exercise you enjoy and do it regularly.  Relaxation techniques. These relax the body and  mind. Examples include yoga, meditation, tai chi, biofeedback, deep breathing, progressive muscle relaxation, listening to music, being out in nature, journaling, and other hobbies. Again, the key is to find  one or more that you enjoy and can do regularly.  Healthy lifestyle. Eat a balanced diet, get plenty of sleep, and do not smoke. Avoid using alcohol or drugs to relax.  Strong support network. Spend time with family, friends, or other people you enjoy being around.Express your feelings and talk things over with someone you trust.  Counseling or talktherapy with a mental health professional may be helpful if you are having difficulty managing stress on your own. Medicine is typically not recommended for the treatment of stress.Talk to your health care provider if you think you need medicine for symptoms of stress. Follow these instructions at home:  Keep all follow-up visits as directed by your health care provider.  Take all medicines as directed by your health care provider. Contact a health care provider if:  Your symptoms get worse or you start having new symptoms.  You feel overwhelmed by your problems and can no longer manage them on your own. Get help right away if:  You feel like hurting yourself or someone else. This information is not intended to replace advice given to you by your health care provider. Make sure you discuss any questions you have with your health care provider. Document Released: 05/23/2001 Document Revised: 05/04/2016 Document Reviewed: 07/22/2013 Elsevier Interactive Patient Education  2017 Elsevier Inc.  

## 2018-02-04 ENCOUNTER — Other Ambulatory Visit: Payer: Self-pay | Admitting: Nurse Practitioner

## 2018-02-04 DIAGNOSIS — I10 Essential (primary) hypertension: Secondary | ICD-10-CM

## 2018-02-13 ENCOUNTER — Encounter: Payer: Self-pay | Admitting: *Deleted

## 2018-03-04 ENCOUNTER — Other Ambulatory Visit: Payer: Self-pay | Admitting: Nurse Practitioner

## 2018-03-04 DIAGNOSIS — F5101 Primary insomnia: Secondary | ICD-10-CM

## 2018-03-19 ENCOUNTER — Other Ambulatory Visit: Payer: Self-pay | Admitting: Nurse Practitioner

## 2018-03-19 DIAGNOSIS — E119 Type 2 diabetes mellitus without complications: Secondary | ICD-10-CM

## 2018-04-16 ENCOUNTER — Other Ambulatory Visit: Payer: Self-pay | Admitting: Nurse Practitioner

## 2018-04-16 DIAGNOSIS — R569 Unspecified convulsions: Secondary | ICD-10-CM

## 2018-04-18 ENCOUNTER — Other Ambulatory Visit: Payer: Self-pay | Admitting: Nurse Practitioner

## 2018-04-18 DIAGNOSIS — I10 Essential (primary) hypertension: Secondary | ICD-10-CM

## 2018-05-15 ENCOUNTER — Other Ambulatory Visit: Payer: Self-pay | Admitting: Nurse Practitioner

## 2018-05-15 DIAGNOSIS — F5101 Primary insomnia: Secondary | ICD-10-CM

## 2018-05-15 DIAGNOSIS — R569 Unspecified convulsions: Secondary | ICD-10-CM

## 2018-06-24 ENCOUNTER — Other Ambulatory Visit: Payer: Self-pay | Admitting: Pediatrics

## 2018-06-24 ENCOUNTER — Other Ambulatory Visit: Payer: Self-pay | Admitting: Nurse Practitioner

## 2018-06-24 DIAGNOSIS — F5101 Primary insomnia: Secondary | ICD-10-CM

## 2018-06-24 DIAGNOSIS — R569 Unspecified convulsions: Secondary | ICD-10-CM

## 2018-06-25 NOTE — Telephone Encounter (Signed)
Patient last seen 01/22/2018

## 2018-07-17 ENCOUNTER — Other Ambulatory Visit: Payer: Self-pay | Admitting: Physician Assistant

## 2018-07-17 DIAGNOSIS — F5101 Primary insomnia: Secondary | ICD-10-CM

## 2018-07-17 DIAGNOSIS — R569 Unspecified convulsions: Secondary | ICD-10-CM

## 2018-07-22 ENCOUNTER — Ambulatory Visit: Payer: Self-pay | Admitting: Nurse Practitioner

## 2018-07-25 ENCOUNTER — Ambulatory Visit (INDEPENDENT_AMBULATORY_CARE_PROVIDER_SITE_OTHER): Payer: Medicaid Other | Admitting: Nurse Practitioner

## 2018-07-25 ENCOUNTER — Encounter: Payer: Self-pay | Admitting: Nurse Practitioner

## 2018-07-25 VITALS — BP 109/75 | HR 66 | Temp 97.2°F | Ht 62.0 in | Wt 175.0 lb

## 2018-07-25 DIAGNOSIS — I10 Essential (primary) hypertension: Secondary | ICD-10-CM | POA: Diagnosis not present

## 2018-07-25 DIAGNOSIS — Z6826 Body mass index (BMI) 26.0-26.9, adult: Secondary | ICD-10-CM | POA: Diagnosis not present

## 2018-07-25 DIAGNOSIS — F5101 Primary insomnia: Secondary | ICD-10-CM | POA: Insufficient documentation

## 2018-07-25 DIAGNOSIS — F410 Panic disorder [episodic paroxysmal anxiety] without agoraphobia: Secondary | ICD-10-CM

## 2018-07-25 DIAGNOSIS — M5136 Other intervertebral disc degeneration, lumbar region: Secondary | ICD-10-CM

## 2018-07-25 DIAGNOSIS — E7800 Pure hypercholesterolemia, unspecified: Secondary | ICD-10-CM | POA: Diagnosis not present

## 2018-07-25 DIAGNOSIS — Z1211 Encounter for screening for malignant neoplasm of colon: Secondary | ICD-10-CM | POA: Diagnosis not present

## 2018-07-25 DIAGNOSIS — K219 Gastro-esophageal reflux disease without esophagitis: Secondary | ICD-10-CM

## 2018-07-25 DIAGNOSIS — F330 Major depressive disorder, recurrent, mild: Secondary | ICD-10-CM | POA: Diagnosis not present

## 2018-07-25 DIAGNOSIS — E119 Type 2 diabetes mellitus without complications: Secondary | ICD-10-CM

## 2018-07-25 DIAGNOSIS — Z1212 Encounter for screening for malignant neoplasm of rectum: Secondary | ICD-10-CM

## 2018-07-25 DIAGNOSIS — R569 Unspecified convulsions: Secondary | ICD-10-CM | POA: Diagnosis not present

## 2018-07-25 LAB — LIPID PANEL
CHOLESTEROL TOTAL: 144 mg/dL (ref 100–199)
Chol/HDL Ratio: 2.7 ratio (ref 0.0–4.4)
HDL: 54 mg/dL (ref >39)
LDL Calculated: 61 mg/dL (ref 0–99)
TRIGLYCERIDES: 146 mg/dL (ref 0–149)
VLDL Cholesterol Cal: 29 mg/dL (ref 5–40)

## 2018-07-25 LAB — CMP14+EGFR
A/G RATIO: 1.6 (ref 1.2–2.2)
ALK PHOS: 70 IU/L (ref 39–117)
ALT: 17 IU/L (ref 0–32)
AST: 18 IU/L (ref 0–40)
Albumin: 4.1 g/dL (ref 3.5–5.5)
BILIRUBIN TOTAL: 0.2 mg/dL (ref 0.0–1.2)
BUN/Creatinine Ratio: 24 — ABNORMAL HIGH (ref 9–23)
BUN: 21 mg/dL (ref 6–24)
CALCIUM: 9.7 mg/dL (ref 8.7–10.2)
CO2: 26 mmol/L (ref 20–29)
Chloride: 100 mmol/L (ref 96–106)
Creatinine, Ser: 0.89 mg/dL (ref 0.57–1.00)
GFR calc Af Amer: 86 mL/min/{1.73_m2} (ref >59)
GFR calc non Af Amer: 75 mL/min/{1.73_m2} (ref >59)
Globulin, Total: 2.5 g/dL (ref 1.5–4.5)
Glucose: 107 mg/dL — ABNORMAL HIGH (ref 65–99)
POTASSIUM: 4.5 mmol/L (ref 3.5–5.2)
Sodium: 139 mmol/L (ref 134–144)
Total Protein: 6.6 g/dL (ref 6.0–8.5)

## 2018-07-25 LAB — BAYER DCA HB A1C WAIVED: HB A1C: 5.5 % (ref <7.0)

## 2018-07-25 MED ORDER — CARVEDILOL 6.25 MG PO TABS: 6.2500 mg | ORAL_TABLET | Freq: Two times a day (BID) | ORAL | 1 refills | Status: DC

## 2018-07-25 MED ORDER — TRAMADOL HCL 50 MG PO TABS: 50.0000 mg | ORAL_TABLET | Freq: Two times a day (BID) | ORAL | 0 refills | Status: DC

## 2018-07-25 MED ORDER — BUSPIRONE HCL 15 MG PO TABS: ORAL_TABLET | ORAL | 1 refills | Status: DC

## 2018-07-25 MED ORDER — LISINOPRIL-HYDROCHLOROTHIAZIDE 20-25 MG PO TABS: 1.0000 | ORAL_TABLET | Freq: Every day | ORAL | 1 refills | Status: DC

## 2018-07-25 MED ORDER — ZOLPIDEM TARTRATE 10 MG PO TABS
10.0000 mg | ORAL_TABLET | Freq: Every evening | ORAL | 5 refills | Status: DC | PRN
Start: 2018-07-25 — End: 2019-01-27

## 2018-07-25 MED ORDER — SIMVASTATIN 40 MG PO TABS: ORAL_TABLET | ORAL | 1 refills | Status: DC

## 2018-07-25 MED ORDER — CARBAMAZEPINE 200 MG PO TABS: 200.0000 mg | ORAL_TABLET | Freq: Four times a day (QID) | ORAL | 5 refills | Status: DC

## 2018-07-25 MED ORDER — METFORMIN HCL 500 MG PO TABS
ORAL_TABLET | ORAL | 1 refills | Status: DC
Start: 2018-07-25 — End: 2019-01-27

## 2018-07-25 MED ORDER — CITALOPRAM HYDROBROMIDE 20 MG PO TABS: 20.0000 mg | ORAL_TABLET | Freq: Every day | ORAL | 1 refills | Status: DC

## 2018-07-25 NOTE — Progress Notes (Signed)
Subjective:    Patient ID: Ellen Delgado, female    DOB: Mar 23, 1966, 52 y.o.   MRN: [id number]   Chief Complaint: Medical Management of Chronic Issues   HPI:  1. Essential hypertension  No c/o chest pain, sob or heaqdaches. Does not check blood pressure at home BP Readings from Last 3 Encounters:  07/25/18 109/75  01/22/18 121/79  10/15/17 (!) 147/91     2. Gastroesophageal reflux disease without esophagitis  Currently on no rx meds. denies symptoms as of late  3. Type 2 diabetes mellitus without complication, without long-term current use of insulin (Seltzer) last hgab1c was 5.3%. Patient is ot checking blood sugars at home. Denies lhypoglycemic sympytoms.  4. DDD (degenerative disc disease), lumbar  Still having back pain. Would like ultra filled today. Does not have pain contract and will need to make appointment for pain management.  5. Seizures ([place])  Denies any recent seizure activity. Still on tegratol. Has not seen neurology lately- has no insurance.  6. Panic attacks  Has frequent panic attacks. At least 2-3 a week. She currently takes buspar and celexa  7. Pure hypercholesterolemia  Not watching diet. No exercise. Still takng zocor daily.  8. BMI 26.0-26.9,adult  Weight is up 10lbs since February. BMI Readings from Last 3 Encounters:  07/25/18 32.01 kg/m  01/22/18 30.18 kg/m  10/15/17 29.63 kg/m     9.      Insomnia          Still taking ambien to sleep at night. Sleeps weel, awakens some, but          feels rested in mornings.  Outpatient Encounter Medications as of 07/25/2018  Medication Sig  . aspirin EC 81 MG tablet Take 1 tablet (81 mg total) by mouth daily.  . busPIRone (BUSPAR) 15 MG tablet TAKE (1) TABLET TWICE A DAY AS NEEDED.  . calcium-vitamin D (OSCAL WITH D) 500-200 MG-UNIT tablet Take 3 tablets by mouth daily.  . carbamazepine (TEGRETOL) 200 MG tablet TAKE 1 TABLET FOUR TIMES DAILY, WITH MEALS AND AT BEDTIME .  Marland Kitchen carvedilol (COREG) 6.25 MG  tablet TAKE 1 TABLET TWICE DAILY WITH A MEAL.  . citalopram (CELEXA) 20 MG tablet Take 1 tablet (20 mg total) daily by mouth.  . cyclobenzaprine (FLEXERIL) 10 MG tablet TAKE 1 TABLET THREE TIMES DAILY AS NEEDED FOR MUSCLE SPASM .  Marland Kitchen lisinopril-hydrochlorothiazide (PRINZIDE,ZESTORETIC) 20-25 MG tablet Take 1 tablet daily by mouth.  . metFORMIN (GLUCOPHAGE) 500 MG tablet TAKE 1 TABLET TWICE DAILY WITH A MEAL.  . Multiple Vitamin (MULTIVITAMIN) tablet Take 1 tablet by mouth daily.  . simvastatin (ZOCOR) 40 MG tablet TAKE (1) TABLET DAILY AT BEDTIME.  . traMADol (ULTRAM) 50 MG tablet TAKE ONE TABLET TWICE A DAY.  Marland Kitchen zolpidem (AMBIEN) 10 MG tablet TAKE 1 TABLET AT BEDTIME IF NEEDED FOR SLEEP .      New complaints: None today  Social history: Lives with her parents- is trying disability    Review of Systems  Constitutional: Negative for activity change and appetite change.  HENT: Negative.   Eyes: Negative for pain.  Respiratory: Negative for shortness of breath.   Cardiovascular: Negative for chest pain, palpitations and leg swelling.  Gastrointestinal: Negative for abdominal pain.  Endocrine: Negative for polydipsia.  Genitourinary: Negative.   Skin: Negative for rash.  Neurological: Negative for dizziness, weakness and headaches.  Hematological: Does not bruise/bleed easily.  Psychiatric/Behavioral: Negative.   All other systems reviewed and are negative.  Objective:   Physical Exam  Constitutional: She is oriented to person, place, and time. She appears well-developed and well-nourished.  HENT:  Head: Normocephalic.  Nose: Nose normal.  Mouth/Throat: Oropharynx is clear and moist.  Eyes: Pupils are equal, round, and reactive to light. EOM are normal.  Neck: Normal range of motion. Neck supple. No JVD present. Carotid bruit is not present.  Cardiovascular: Normal rate, regular rhythm, normal heart sounds and intact distal pulses.  Pulmonary/Chest: Effort normal and  breath sounds normal. No respiratory distress. She has no wheezes. She has no rales. She exhibits no tenderness.  Abdominal: Soft. Normal appearance, normal aorta and bowel sounds are normal. She exhibits no distension, no abdominal bruit, no pulsatile midline mass and no mass. There is no splenomegaly or hepatomegaly. There is no tenderness.  Musculoskeletal: Normal range of motion. She exhibits no edema.  Lymphadenopathy:    She has no cervical adenopathy.  Neurological: She is alert and oriented to person, place, and time. She has normal reflexes.  Skin: Skin is warm and dry.  Psychiatric: She has a normal mood and affect. Her behavior is normal. Judgment and thought content normal.    BP 109/75   Pulse 66   Temp (!) 97.2 F (36.2 C) (Oral)   Ht '5\' 2"'  (1.575 m)   Wt 175 lb (79.4 kg)   BMI 32.01 kg/m   HGBA1c 5.5% today     Assessment & Plan:  Ellen Delgado comes in today with chief complaint of Medical Management of Chronic Issues (Wants tramadol RF)   Diagnosis and orders addressed:  1. Essential hypertension Low sodium diet - CMP14+EGFR - carvedilol (COREG) 6.25 MG tablet; Take 1 tablet (6.25 mg total) by mouth 2 (two) times daily with a meal.  Dispense: 180 tablet; Refill: 1 - lisinopril-hydrochlorothiazide (PRINZIDE,ZESTORETIC) 20-25 MG tablet; Take 1 tablet by mouth daily.  Dispense: 90 tablet; Refill: 1  2. Gastroesophageal reflux disease without esophagitis Avoid spicy foods Do not eat 2 hours prior to bedtime  3. Type 2 diabetes mellitus without complication, without long-term current use of insulin (HCC) Continue to watch carbs in diet - Bayer DCA Hb A1c Waived - Microalbumin / creatinine urine ratio - metFORMIN (GLUCOPHAGE) 500 MG tablet; TAKE 1 TABLET TWICE DAILY WITH A MEAL.  Dispense: 180 tablet; Refill: 1  4. DDD (degenerative disc disease), lumbar Patient will make appointment for pain management - traMADol (ULTRAM) 50 MG tablet; Take 1 tablet (50 mg  total) by mouth 2 (two) times daily.  Dispense: 60 tablet; Refill: 0  5. Seizures ([place]) Keep diary of any seizure activity - carbamazepine (TEGRETOL) 200 MG tablet; Take 1 tablet (200 mg total) by mouth 4 (four) times daily.  Dispense: 120 tablet; Refill: 5  6. Panic attacks sytress management - busPIRone (BUSPAR) 15 MG tablet; TAKE (1) TABLET TWICE A DAY AS NEEDED.  Dispense: 180 tablet; Refill: 1  7. Pure hypercholesterolemia Low fat diet - Lipid panel - simvastatin (ZOCOR) 40 MG tablet; TAKE (1) TABLET DAILY AT BEDTIME.  Dispense: 90 tablet; Refill: 1  8. BMI 26.0-26.9,adult Discussed diet and exercise for person with BMI >25 Will recheck weight in 3-6 months  9. Primary insomnia Bedtime routine - zolpidem (AMBIEN) 10 MG tablet; Take 1 tablet (10 mg total) by mouth at bedtime as needed for sleep.  Dispense: 30 tablet; Refill: 5  10. Encounter for colorectal cancer screening - Cologuard  11. Mild episode of recurrent major depressive disorder (HCC)  - citalopram (CELEXA) 20  MG tablet; Take 1 tablet (20 mg total) by mouth daily.  Dispense: 90 tablet; Refill: 1   Labs pending Health Maintenance reviewed Diet and exercise encouraged  Follow up plan: 6 months   Mary-Margaret Hassell Done, FNP

## 2018-07-25 NOTE — Patient Instructions (Signed)
Stress and Stress Management Stress is a normal reaction to life events. It is what you feel when life demands more than you are used to or more than you can handle. Some stress can be useful. For example, the stress reaction can help you catch the last bus of the day, study for a test, or meet a deadline at work. But stress that occurs too often or for too long can cause problems. It can affect your emotional health and interfere with relationships and normal daily activities. Too much stress can weaken your immune system and increase your risk for physical illness. If you already have a medical problem, stress can make it worse. What are the causes? All sorts of life events may cause stress. An event that causes stress for one person may not be stressful for another person. Major life events commonly cause stress. These may be positive or negative. Examples include losing your job, moving into a new home, getting married, having a baby, or losing a loved one. Less obvious life events may also cause stress, especially if they occur day after day or in combination. Examples include working long hours, driving in traffic, caring for children, being in debt, or being in a difficult relationship. What are the signs or symptoms? Stress may cause emotional symptoms including, the following:  Anxiety. This is feeling worried, afraid, on edge, overwhelmed, or out of control.  Anger. This is feeling irritated or impatient.  Depression. This is feeling sad, down, helpless, or guilty.  Difficulty focusing, remembering, or making decisions.  Stress may cause physical symptoms, including the following:  Aches and pains. These may affect your head, neck, back, stomach, or other areas of your body.  Tight muscles or clenched jaw.  Low energy or trouble sleeping.  Stress may cause unhealthy behaviors, including the following:  Eating to feel better (overeating) or skipping meals.  Sleeping too little,  too much, or both.  Working too much or putting off tasks (procrastination).  Smoking, drinking alcohol, or using drugs to feel better.  How is this diagnosed? Stress is diagnosed through an assessment by your health care provider. Your health care provider will ask questions about your symptoms and any stressful life events.Your health care provider will also ask about your medical history and may order blood tests or other tests. Certain medical conditions and medicine can cause physical symptoms similar to stress. Mental illness can cause emotional symptoms and unhealthy behaviors similar to stress. Your health care provider may refer you to a mental health professional for further evaluation. How is this treated? Stress management is the recommended treatment for stress.The goals of stress management are reducing stressful life events and coping with stress in healthy ways. Techniques for reducing stressful life events include the following:  Stress identification. Self-monitor for stress and identify what causes stress for you. These skills may help you to avoid some stressful events.  Time management. Set your priorities, keep a calendar of events, and learn to say "no." These tools can help you avoid making too many commitments.  Techniques for coping with stress include the following:  Rethinking the problem. Try to think realistically about stressful events rather than ignoring them or overreacting. Try to find the positives in a stressful situation rather than focusing on the negatives.  Exercise. Physical exercise can release both physical and emotional tension. The key is to find a form of exercise you enjoy and do it regularly.  Relaxation techniques. These relax the body and  mind. Examples include yoga, meditation, tai chi, biofeedback, deep breathing, progressive muscle relaxation, listening to music, being out in nature, journaling, and other hobbies. Again, the key is to find  one or more that you enjoy and can do regularly.  Healthy lifestyle. Eat a balanced diet, get plenty of sleep, and do not smoke. Avoid using alcohol or drugs to relax.  Strong support network. Spend time with family, friends, or other people you enjoy being around.Express your feelings and talk things over with someone you trust.  Counseling or talktherapy with a mental health professional may be helpful if you are having difficulty managing stress on your own. Medicine is typically not recommended for the treatment of stress.Talk to your health care provider if you think you need medicine for symptoms of stress. Follow these instructions at home:  Keep all follow-up visits as directed by your health care provider.  Take all medicines as directed by your health care provider. Contact a health care provider if:  Your symptoms get worse or you start having new symptoms.  You feel overwhelmed by your problems and can no longer manage them on your own. Get help right away if:  You feel like hurting yourself or someone else. This information is not intended to replace advice given to you by your health care provider. Make sure you discuss any questions you have with your health care provider. Document Released: 05/23/2001 Document Revised: 05/04/2016 Document Reviewed: 07/22/2013 Elsevier Interactive Patient Education  2017 Elsevier Inc.  

## 2018-07-25 NOTE — Addendum Note (Signed)
Addended by: Bennie PieriniMARTIN, MARY-MARGARET on: 07/25/2018 09:01 AM   Modules accepted: Orders

## 2018-09-08 ENCOUNTER — Other Ambulatory Visit: Payer: Self-pay | Admitting: Nurse Practitioner

## 2018-09-08 ENCOUNTER — Other Ambulatory Visit: Payer: Self-pay | Admitting: Physician Assistant

## 2018-09-08 DIAGNOSIS — M5136 Other intervertebral disc degeneration, lumbar region: Secondary | ICD-10-CM

## 2018-09-09 NOTE — Telephone Encounter (Signed)
Last seen 07/25/18  MMM

## 2018-09-17 ENCOUNTER — Ambulatory Visit: Payer: Medicaid Other | Admitting: Nurse Practitioner

## 2018-09-17 ENCOUNTER — Telehealth: Payer: Self-pay

## 2018-09-17 ENCOUNTER — Encounter: Payer: Self-pay | Admitting: Nurse Practitioner

## 2018-09-17 VITALS — BP 110/79 | HR 78 | Temp 97.0°F | Ht 62.0 in | Wt 176.0 lb

## 2018-09-17 DIAGNOSIS — M5136 Other intervertebral disc degeneration, lumbar region: Secondary | ICD-10-CM | POA: Diagnosis not present

## 2018-09-17 MED ORDER — TRAMADOL HCL 50 MG PO TABS: 50.0000 mg | ORAL_TABLET | Freq: Three times a day (TID) | ORAL | 0 refills | Status: AC | PRN

## 2018-09-17 MED ORDER — TRAMADOL HCL 50 MG PO TABS: 50.0000 mg | ORAL_TABLET | Freq: Two times a day (BID) | ORAL | 0 refills | Status: AC

## 2018-09-17 NOTE — Progress Notes (Signed)
   Subjective:    Patient ID: Ellen Delgado, female    DOB: Sep 28, 1966, 52 y.o.   MRN: [id number]   Chief Complaint: Pain   HPI Patient comes in today for pain management. Pain assessment: Cause of pain- DDD Pain location- lumbar spine Pain on scale of 1-10- 5-8/10 Frequency- daily   What increases pain-lots of bending What makes pain Better-rest Effects on ADL - able to get done what needs to get done Any change in general medical condition-none  Current medications-tylenol Effectiveness of current meds-no relief Adverse reactions form pain meds-none Morphine equivalent- 0  Pill count performed-No Urine drug screen- Yes Was the N[facility]SR reviewed- yes  If yes were their any concerning findings? - none  Pain contract signed on:09/17/18    Review of Systems  Respiratory: Negative.   Cardiovascular: Negative.   Genitourinary: Negative.   Musculoskeletal: Positive for back pain.  Neurological: Negative.   Psychiatric/Behavioral: Negative.   All other systems reviewed and are negative.      Objective:   Physical Exam  Constitutional: She is oriented to person, place, and time. She appears well-developed. She appears distressed (mild).  Cardiovascular: Normal rate and regular rhythm.  Pulmonary/Chest: Effort normal and breath sounds normal.  Musculoskeletal:  From of lumbar spine with pain on flexion and rotation No pain on palpation (-) SLR bil Motor strength and sensation distally intact  Neurological: She is alert and oriented to person, place, and time.  Skin: Skin is warm.  Psychiatric: She has a normal mood and affect. Her behavior is normal. Thought content normal.   BP 110/79   Pulse 78   Temp (!) 97 F (36.1 C) (Oral)   Ht 5\' 2"  (1.575 m)   Wt 176 lb (79.8 kg)   BMI 32.19 kg/m       Assessment & Plan:  .Ellen Delgado in today with chief complaint of Pain   1. DDD (degenerative disc disease), lumbar Moist heat to back Rest  RTOprn -  ToxASSURE Select 13 (MW), Urine - traMADol (ULTRAM) 50 MG tablet; Take 1 tablet (50 mg total) by mouth every 8 (eight) hours as needed.  Dispense: 60 tablet; Refill: 0 - traMADol (ULTRAM) 50 MG tablet; Take 1 tablet (50 mg total) by mouth 2 (two) times daily.  Dispense: 60 tablet; Refill: 0 - traMADol (ULTRAM) 50 MG tablet; Take 1 tablet (50 mg total) by mouth every 8 (eight) hours as needed.  Dispense: 60 tablet; Refill: 0   Mary-Margaret Daphine Deutscher, FNP

## 2018-09-17 NOTE — Telephone Encounter (Signed)
Pharmacy states that Tramdol was sent in today 3 times- wanting to know if it was an accident of for a 3 month supply? Please advise

## 2018-09-17 NOTE — Patient Instructions (Signed)

## 2018-09-18 NOTE — Telephone Encounter (Signed)
Controls are written that way- should have fill dates printed in them

## 2018-09-18 NOTE — Telephone Encounter (Signed)
Pharmacy notified. Pharmacist states that with tramadol we can send in with RFs on 1 rx. Advised pharmacist to put other two rxs on file.

## 2018-09-22 LAB — TOXASSURE SELECT 13 (MW), URINE

## 2018-10-10 ENCOUNTER — Telehealth: Payer: Self-pay | Admitting: Nurse Practitioner

## 2018-10-15 NOTE — Telephone Encounter (Signed)
10/31 spoke to pharmacy and prior auth started. MT

## 2019-01-24 ENCOUNTER — Other Ambulatory Visit: Payer: Self-pay | Admitting: Nurse Practitioner

## 2019-01-24 DIAGNOSIS — I10 Essential (primary) hypertension: Secondary | ICD-10-CM

## 2019-01-24 DIAGNOSIS — E7800 Pure hypercholesterolemia, unspecified: Secondary | ICD-10-CM

## 2019-01-24 DIAGNOSIS — R569 Unspecified convulsions: Secondary | ICD-10-CM

## 2019-01-24 DIAGNOSIS — F330 Major depressive disorder, recurrent, mild: Secondary | ICD-10-CM

## 2019-01-27 ENCOUNTER — Ambulatory Visit: Payer: Medicaid Other | Admitting: Nurse Practitioner

## 2019-01-27 ENCOUNTER — Encounter: Payer: Self-pay | Admitting: Nurse Practitioner

## 2019-01-27 VITALS — BP 99/61 | HR 76 | Temp 97.4°F | Ht 62.0 in | Wt 175.5 lb

## 2019-01-27 DIAGNOSIS — K219 Gastro-esophageal reflux disease without esophagitis: Secondary | ICD-10-CM

## 2019-01-27 DIAGNOSIS — F410 Panic disorder [episodic paroxysmal anxiety] without agoraphobia: Secondary | ICD-10-CM

## 2019-01-27 DIAGNOSIS — R569 Unspecified convulsions: Secondary | ICD-10-CM | POA: Diagnosis not present

## 2019-01-27 DIAGNOSIS — Z6826 Body mass index (BMI) 26.0-26.9, adult: Secondary | ICD-10-CM

## 2019-01-27 DIAGNOSIS — E119 Type 2 diabetes mellitus without complications: Secondary | ICD-10-CM | POA: Diagnosis not present

## 2019-01-27 DIAGNOSIS — F5101 Primary insomnia: Secondary | ICD-10-CM

## 2019-01-27 DIAGNOSIS — Z1212 Encounter for screening for malignant neoplasm of rectum: Secondary | ICD-10-CM

## 2019-01-27 DIAGNOSIS — F330 Major depressive disorder, recurrent, mild: Secondary | ICD-10-CM | POA: Diagnosis not present

## 2019-01-27 DIAGNOSIS — I10 Essential (primary) hypertension: Secondary | ICD-10-CM

## 2019-01-27 DIAGNOSIS — Z1211 Encounter for screening for malignant neoplasm of colon: Secondary | ICD-10-CM | POA: Diagnosis not present

## 2019-01-27 DIAGNOSIS — E7800 Pure hypercholesterolemia, unspecified: Secondary | ICD-10-CM | POA: Diagnosis not present

## 2019-01-27 LAB — BAYER DCA HB A1C WAIVED: HB A1C: 6.2 % (ref <7.0)

## 2019-01-27 MED ORDER — LISINOPRIL-HYDROCHLOROTHIAZIDE 20-25 MG PO TABS: 1.0000 | ORAL_TABLET | Freq: Every day | ORAL | 5 refills | Status: DC

## 2019-01-27 MED ORDER — BUSPIRONE HCL 15 MG PO TABS: ORAL_TABLET | ORAL | 1 refills | Status: DC

## 2019-01-27 MED ORDER — CARVEDILOL 6.25 MG PO TABS: ORAL_TABLET | ORAL | 5 refills | Status: DC

## 2019-01-27 MED ORDER — SIMVASTATIN 40 MG PO TABS: ORAL_TABLET | ORAL | 0 refills | Status: DC

## 2019-01-27 MED ORDER — METFORMIN HCL 500 MG PO TABS: ORAL_TABLET | ORAL | 1 refills | Status: DC

## 2019-01-27 MED ORDER — ZOLPIDEM TARTRATE 10 MG PO TABS: 10.0000 mg | ORAL_TABLET | Freq: Every evening | ORAL | 5 refills | Status: DC | PRN

## 2019-01-27 MED ORDER — CARBAMAZEPINE 200 MG PO TABS: ORAL_TABLET | ORAL | 5 refills | Status: DC

## 2019-01-27 MED ORDER — CITALOPRAM HYDROBROMIDE 20 MG PO TABS: 20.0000 mg | ORAL_TABLET | Freq: Every day | ORAL | 5 refills | Status: DC

## 2019-01-27 NOTE — Patient Instructions (Signed)

## 2019-01-27 NOTE — Progress Notes (Signed)
Subjective:    Patient ID: Ellen Delgado, female    DOB: 10/18/1966, 53 y.o.   MRN: [id number]   Chief Complaint: medical management of chronic issues  HPI:  1. Essential hypertension  No c/o chest pain, sob or headache. Does not check blood pressure at home. BP Readings from Last 3 Encounters:  09/17/18 110/79  07/25/18 109/75  01/22/18 121/79     2. Gastroesophageal reflux disease without esophagitis  Currently on no meds for this. Has not needed any OTC meds either.  3. Type 2 diabetes mellitus without complication, without long-term current use of insulin (HCC) last HGBA1C was 5.5. she does not check blood sugars at home. tries to watch diet.  4. Seizures ([place])  No recent seizure activity. Has not seen neurolgist in several years. She is still on tegratol.  5. Primary insomnia  Takes ambien nightly and sleeps well with it. Cannot sleep without it.  6. Panic attacks  Takes buspar BID as needed. No recent panic attacks.  7. Pure hypercholesterolemia  Does not exercise and does not watch diet at all.  8. BMI 26.0-26.9,adult  Weight is unchanged from previous.    Outpatient Encounter Medications as of 01/27/2019  Medication Sig  . aspirin EC 81 MG tablet Take 1 tablet (81 mg total) by mouth daily.  . busPIRone (BUSPAR) 15 MG tablet TAKE (1) TABLET TWICE A DAY AS NEEDED.  . calcium-vitamin D (OSCAL WITH D) 500-200 MG-UNIT tablet Take 3 tablets by mouth daily.  . carvedilol (COREG) 6.25 MG tablet TAKE 1 TABLET TWICE DAILY WITH A MEAL.  . citalopram (CELEXA) 20 MG tablet TAKE 1 TABLET ONCE DAILY.  . cyclobenzaprine (FLEXERIL) 10 MG tablet TAKE 1 TABLET THREE TIMES DAILY AS NEEDED FOR MUSCLE SPASMS.  Marland Kitchen lisinopril-hydrochlorothiazide (PRINZIDE,ZESTORETIC) 20-25 MG tablet TAKE 1 TABLET ONCE DAILY.  . metFORMIN (GLUCOPHAGE) 500 MG tablet TAKE 1 TABLET TWICE DAILY WITH A MEAL.  . Multiple Vitamin (MULTIVITAMIN) tablet Take 1 tablet by mouth daily.  . simvastatin (ZOCOR) 40 MG  tablet TAKE ONE TABLET AT BED- TIME.  Marland Kitchen TEGRETOL 200 MG tablet TAKE  (1)  TABLET  FOUR TIMES DAILY.  Marland Kitchen zolpidem (AMBIEN) 10 MG tablet Take 1 tablet (10 mg total) by mouth at bedtime as needed for sleep.    New complaints: None today  Social history: Mom died in January suddeenly. She had to do CPR on her while waiting on EMS to arrive. She never revived.   Review of Systems  Constitutional: Negative for activity change and appetite change.  HENT: Negative.   Eyes: Negative for pain.  Respiratory: Negative for shortness of breath.   Cardiovascular: Negative for chest pain, palpitations and leg swelling.  Gastrointestinal: Negative for abdominal pain.  Endocrine: Negative for polydipsia.  Genitourinary: Negative.   Skin: Negative for rash.  Neurological: Negative for dizziness, weakness and headaches.  Hematological: Does not bruise/bleed easily.  Psychiatric/Behavioral: Negative.   All other systems reviewed and are negative.      Objective:   Physical Exam Vitals signs and nursing note reviewed.  Constitutional:      General: She is not in acute distress.    Appearance: Normal appearance. She is well-developed.  HENT:     Head: Normocephalic.     Nose: Nose normal.  Eyes:     Pupils: Pupils are equal, round, and reactive to light.  Neck:     Musculoskeletal: Normal range of motion and neck supple.     Vascular: No carotid  bruit or JVD.  Cardiovascular:     Rate and Rhythm: Normal rate and regular rhythm.     Heart sounds: Normal heart sounds.  Pulmonary:     Effort: Pulmonary effort is normal. No respiratory distress.     Breath sounds: Normal breath sounds. No wheezing or rales.  Chest:     Chest wall: No tenderness.  Abdominal:     General: Bowel sounds are normal. There is no distension or abdominal bruit.     Palpations: Abdomen is soft. There is no hepatomegaly, splenomegaly, mass or pulsatile mass.     Tenderness: There is no abdominal tenderness.    Musculoskeletal: Normal range of motion.  Lymphadenopathy:     Cervical: No cervical adenopathy.  Skin:    General: Skin is warm and dry.  Neurological:     Mental Status: She is alert and oriented to person, place, and time.     Deep Tendon Reflexes: Reflexes are normal and symmetric.  Psychiatric:        Behavior: Behavior normal.        Thought Content: Thought content normal.        Judgment: Judgment normal.    BP 99/61   Pulse 76   Temp (!) 97.4 F (36.3 C) (Oral)   Ht _0  (1.575 m)   Wt 175 lb 8 oz (79.6 kg)   BMI 32.10 kg/m   hgba1c 6.5%      Assessment & Plan:  ANWEN CANNEDY comes in today with chief complaint of Medical Management of Chronic Issues   Diagnosis and orders addressed:  1. Essential hypertension Low sodium diet - lisinopril-hydrochlorothiazide (PRINZIDE,ZESTORETIC) 20-25 MG tablet; Take 1 tablet by mouth daily.  Dispense: 30 tablet; Refill: 5 - carvedilol (COREG) 6.25 MG tablet; TAKE 1 TABLET TWICE DAILY WITH A MEAL.  Dispense: 60 tablet; Refill: 5 - CMP14+EGFR  2. Gastroesophageal reflux disease without esophagitis Avoid spicy foods Do not eat 2 hours prior to bedtime  3. Type 2 diabetes mellitus without complication, without long-term current use of insulin (HCC) Continue to watch carbs in diet - Microalbumin / creatinine urine ratio - Bayer DCA Hb A1c Waived - metFORMIN (GLUCOPHAGE) 500 MG tablet; TAKE 1 TABLET TWICE DAILY WITH A MEAL.  Dispense: 180 tablet; Refill: 1  4. Seizures (HCC) Continue current dose of tegratol Report any seizure activiy - carbamazepine (TEGRETOL) 200 MG tablet; TAKE  (1)  TABLET  FOUR TIMES DAILY.  Dispense: 120 tablet; Refill: 5  5. Primary insomnia Bedtime routin reveiwed - zolpidem (AMBIEN) 10 MG tablet; Take 1 tablet (10 mg total) by mouth at bedtime as needed for sleep.  Dispense: 30 tablet; Refill: 5  6. Panic attacks Stress management - busPIRone (BUSPAR) 15 MG tablet; TAKE (1) TABLET TWICE A  DAY AS NEEDED.  Dispense: 180 tablet; Refill: 1  7. Pure hypercholesterolemia Low fat diet - simvastatin (ZOCOR) 40 MG tablet; TAKE ONE TABLET AT BED- TIME.  Dispense: 30 tablet; Refill: 0 - Lipid panel  8. BMI 26.0-26.9,adult Discussed diet and exercise for person with BMI >25 Will recheck weight in 3-6 months  9. Mild episode of recurrent major depressive disorder (HCC) - citalopram (CELEXA) 20 MG tablet; Take 1 tablet (20 mg total) by mouth daily.  Dispense: 30 tablet; Refill: 5  Ordered cologuard Patient will make appointment for eye exam Labs pending Health Maintenance reviewed Diet and exercise encouraged  Follow up plan: 6 months   Mary-Margaret Hassell Done, FNP

## 2019-01-28 LAB — LIPID PANEL
Chol/HDL Ratio: 2.5 ratio (ref 0.0–4.4)
Cholesterol, Total: 154 mg/dL (ref 100–199)
HDL: 61 mg/dL (ref >39)
LDL Calculated: 61 mg/dL (ref 0–99)
TRIGLYCERIDES: 161 mg/dL — AB (ref 0–149)
VLDL Cholesterol Cal: 32 mg/dL (ref 5–40)

## 2019-01-28 LAB — CMP14+EGFR
ALT: 16 IU/L (ref 0–32)
AST: 15 IU/L (ref 0–40)
Albumin/Globulin Ratio: 1.6 (ref 1.2–2.2)
Albumin: 4.1 g/dL (ref 3.8–4.9)
Alkaline Phosphatase: 69 IU/L (ref 39–117)
BUN/Creatinine Ratio: 30 — ABNORMAL HIGH (ref 9–23)
BUN: 23 mg/dL (ref 6–24)
CO2: 27 mmol/L (ref 20–29)
Calcium: 9.7 mg/dL (ref 8.7–10.2)
Chloride: 98 mmol/L (ref 96–106)
Creatinine, Ser: 0.76 mg/dL (ref 0.57–1.00)
GFR calc Af Amer: 104 mL/min/{1.73_m2} (ref >59)
GFR calc non Af Amer: 90 mL/min/{1.73_m2} (ref >59)
Globulin, Total: 2.5 g/dL (ref 1.5–4.5)
Glucose: 102 mg/dL — ABNORMAL HIGH (ref 65–99)
Potassium: 4.3 mmol/L (ref 3.5–5.2)
SODIUM: 139 mmol/L (ref 134–144)
Total Protein: 6.6 g/dL (ref 6.0–8.5)

## 2019-01-28 LAB — MICROALBUMIN / CREATININE URINE RATIO
Creatinine, Urine: 93.5 mg/dL
Microalb/Creat Ratio: 18 mg/g creat (ref 0–29)
Microalbumin, Urine: 16.7 ug/mL

## 2019-03-26 ENCOUNTER — Other Ambulatory Visit: Payer: Self-pay | Admitting: Nurse Practitioner

## 2019-03-26 DIAGNOSIS — E7800 Pure hypercholesterolemia, unspecified: Secondary | ICD-10-CM

## 2019-04-24 ENCOUNTER — Other Ambulatory Visit: Payer: Self-pay | Admitting: Nurse Practitioner

## 2019-05-15 ENCOUNTER — Telehealth: Payer: Self-pay | Admitting: Nurse Practitioner

## 2019-07-25 ENCOUNTER — Other Ambulatory Visit: Payer: Self-pay | Admitting: Nurse Practitioner

## 2019-07-25 NOTE — Telephone Encounter (Signed)
Rescheduled patient for original appt day and advised that we would schedule pap appt when she could get a ride to come to the office.

## 2019-07-27 DIAGNOSIS — H5213 Myopia, bilateral: Secondary | ICD-10-CM | POA: Diagnosis not present

## 2019-07-28 ENCOUNTER — Ambulatory Visit: Payer: Medicaid Other | Admitting: Nurse Practitioner

## 2019-07-28 ENCOUNTER — Ambulatory Visit (INDEPENDENT_AMBULATORY_CARE_PROVIDER_SITE_OTHER): Payer: Medicaid Other | Admitting: Nurse Practitioner

## 2019-07-28 ENCOUNTER — Encounter: Payer: Self-pay | Admitting: Nurse Practitioner

## 2019-07-28 DIAGNOSIS — R569 Unspecified convulsions: Secondary | ICD-10-CM

## 2019-07-28 DIAGNOSIS — F330 Major depressive disorder, recurrent, mild: Secondary | ICD-10-CM

## 2019-07-28 DIAGNOSIS — E119 Type 2 diabetes mellitus without complications: Secondary | ICD-10-CM

## 2019-07-28 DIAGNOSIS — F5101 Primary insomnia: Secondary | ICD-10-CM | POA: Diagnosis not present

## 2019-07-28 DIAGNOSIS — Z6826 Body mass index (BMI) 26.0-26.9, adult: Secondary | ICD-10-CM | POA: Diagnosis not present

## 2019-07-28 DIAGNOSIS — E7800 Pure hypercholesterolemia, unspecified: Secondary | ICD-10-CM

## 2019-07-28 DIAGNOSIS — I10 Essential (primary) hypertension: Secondary | ICD-10-CM

## 2019-07-28 DIAGNOSIS — K219 Gastro-esophageal reflux disease without esophagitis: Secondary | ICD-10-CM | POA: Diagnosis not present

## 2019-07-28 DIAGNOSIS — F410 Panic disorder [episodic paroxysmal anxiety] without agoraphobia: Secondary | ICD-10-CM | POA: Diagnosis not present

## 2019-07-28 LAB — BAYER DCA HB A1C WAIVED: HB A1C (BAYER DCA - WAIVED): 6.2 % (ref <7.0)

## 2019-07-28 MED ORDER — LISINOPRIL-HYDROCHLOROTHIAZIDE 20-25 MG PO TABS: 1.0000 | ORAL_TABLET | Freq: Every day | ORAL | 1 refills | Status: DC

## 2019-07-28 MED ORDER — BUSPIRONE HCL 15 MG PO TABS: ORAL_TABLET | ORAL | 1 refills | Status: DC

## 2019-07-28 MED ORDER — CARVEDILOL 6.25 MG PO TABS: ORAL_TABLET | ORAL | 1 refills | Status: DC

## 2019-07-28 MED ORDER — SIMVASTATIN 40 MG PO TABS: 40.0000 mg | ORAL_TABLET | Freq: Every day | ORAL | 1 refills | Status: DC

## 2019-07-28 MED ORDER — CARBAMAZEPINE 200 MG PO TABS: ORAL_TABLET | ORAL | 5 refills | Status: DC

## 2019-07-28 MED ORDER — ZOLPIDEM TARTRATE 10 MG PO TABS: 10.0000 mg | ORAL_TABLET | Freq: Every evening | ORAL | 5 refills | Status: DC | PRN

## 2019-07-28 MED ORDER — METFORMIN HCL 500 MG PO TABS: ORAL_TABLET | ORAL | 1 refills | Status: DC

## 2019-07-28 MED ORDER — CITALOPRAM HYDROBROMIDE 20 MG PO TABS: 20.0000 mg | ORAL_TABLET | Freq: Every day | ORAL | 1 refills | Status: DC

## 2019-07-28 NOTE — Progress Notes (Signed)
Virtual Visit via telephone Note Due to COVID-19 pandemic this visit was conducted virtually. This visit type was conducted due to national recommendations for restrictions regarding the COVID-19 Pandemic (e.g. social distancing, sheltering in place) in an effort to limit this patient's exposure and mitigate transmission in our community. All issues noted in this document were discussed and addressed.  A physical exam was not performed with this format.  I connected with Ellen Delgado on 07/28/19 at 9:15 by telephone and verified that I am speaking with the correct person using two identifiers. Ellen Delgado is currently located at home and no one is currently with her during visit. The provider, Mary-Margaret Hassell Done, FNP is located in their office at time of visit.  I discussed the limitations, risks, security and privacy concerns of performing an evaluation and management service by telephone and the availability of in person appointments. I also discussed with the patient that there may be a patient responsible charge related to this service. The patient expressed understanding and agreed to proceed.   History and Present Illness:   Chief Complaint: Medical Management of Chronic Issues    HPI:  1. Essential hypertension No c/o chest pain, sob or headache. Does not check blood pressure at home. BP Readings from Last 3 Encounters:  01/27/19 99/61  09/17/18 110/79  07/25/18 109/75     2. Type 2 diabetes mellitus without complication, without long-term current use of insulin ([place]) Patient very seldom checks her blood sugars at home. She denies any low blood sugar symptoms. Lab Results  Component Value Date   HGBA1C 6.2 01/27/2019     3. Pure hypercholesterolemia Does not watch diet and does no exercise Lab Results  Component Value Date   CHOL 154 01/27/2019   HDL 61 01/27/2019   LDLCALC 61 01/27/2019   TRIG 161 (H) 01/27/2019   CHOLHDL 2.5 01/27/2019     4.  Gastroesophageal reflux disease without esophagitis Is not on any rx meds. Uses OTC meds when needed  5. Panic attacks Is on buspar 3x a day and is working well for her. She denies any recent panic attacks  6. Primary insomnia Is on ambien to sleep at night  7. Seizures ([place]) Is on tegratol and denies any seizure activity.  8. BMI 26.0-26.9,adult Has gained a few lbs since last visit   Outpatient Encounter Medications as of 07/28/2019  Medication Sig  . aspirin EC 81 MG tablet Take 1 tablet (81 mg total) by mouth daily.  . busPIRone (BUSPAR) 15 MG tablet TAKE (1) TABLET TWICE A DAY AS NEEDED.  . calcium-vitamin D (OSCAL WITH D) 500-200 MG-UNIT tablet Take 3 tablets by mouth daily.  . carbamazepine (TEGRETOL) 200 MG tablet TAKE  (1)  TABLET  FOUR TIMES DAILY.  . carvedilol (COREG) 6.25 MG tablet TAKE 1 TABLET TWICE DAILY WITH A MEAL.  . citalopram (CELEXA) 20 MG tablet Take 1 tablet (20 mg total) by mouth daily.  . cyclobenzaprine (FLEXERIL) 10 MG tablet TAKE 1 TABLET THREE TIMES DAILY AS NEEDED FOR MUSCLE SPASM .  Marland Kitchen lisinopril-hydrochlorothiazide (PRINZIDE,ZESTORETIC) 20-25 MG tablet Take 1 tablet by mouth daily.  . metFORMIN (GLUCOPHAGE) 500 MG tablet TAKE 1 TABLET TWICE DAILY WITH A MEAL.  . Multiple Vitamin (MULTIVITAMIN) tablet Take 1 tablet by mouth daily.  . simvastatin (ZOCOR) 40 MG tablet TAKE ONE TABLET AT BED- TIME.  Marland Kitchen zolpidem (AMBIEN) 10 MG tablet Take 1 tablet (10 mg total) by mouth at bedtime as needed for sleep.  Past Surgical History:  Procedure Laterality Date  . CHOLECYSTECTOMY      Family History  Problem Relation Age of Onset  . Diabetes Mother   . Diabetes Father   . Coronary artery disease Father   . Cancer Paternal Grandmother     New complaints: None today  Social history: Both her parents passed away this year and so now she is going to have to move. Thinks she is going to move in with her biyfriend.  Controlled substance contract: Ellen Delgado     Review of Systems  Constitutional: Negative for diaphoresis and weight loss.  Eyes: Negative for blurred vision, double vision and pain.  Respiratory: Negative for shortness of breath.   Cardiovascular: Negative for chest pain, palpitations, orthopnea and leg swelling.  Gastrointestinal: Negative for abdominal pain.  Skin: Negative for rash.  Neurological: Negative for dizziness, sensory change, loss of consciousness, weakness and headaches.  Endo/Heme/Allergies: Negative for polydipsia. Does not bruise/bleed easily.  Psychiatric/Behavioral: Negative for memory loss. The patient does not have insomnia.   All other systems reviewed and are negative.    Observations/Objective: Alert and oriented- answers all questions appropriately\ No distress  Assessment and Plan: NYAISHA SIMAO comes in today with chief complaint of Medical Management of Chronic Issues   Diagnosis and orders addressed:  1. Essential hypertension Low sodium diet - CMP14+EGFR - lisinopril-hydrochlorothiazide (ZESTORETIC) 20-25 MG tablet; Take 1 tablet by mouth daily.  Dispense: 90 tablet; Refill: 1 - carvedilol (COREG) 6.25 MG tablet; TAKE 1 TABLET TWICE DAILY WITH A MEAL.  Dispense: 180 tablet; Refill: 1  2. Type 2 diabetes mellitus without complication, without long-term current use of insulin (HCC) Continue to watch carbs in diet - Bayer DCA Hb A1c Waived - metFORMIN (GLUCOPHAGE) 500 MG tablet; TAKE 1 TABLET TWICE DAILY WITH A MEAL.  Dispense: 180 tablet; Refill: 1  3. Pure hypercholesterolemia Low fat diet - Lipid panel - simvastatin (ZOCOR) 40 MG tablet; Take 1 tablet (40 mg total) by mouth daily at 6 PM.  Dispense: 90 tablet; Refill: 1  4. Gastroesophageal reflux disease without esophagitis Avoid spicy foods Do not eat 2 hours prior to bedtime  5. Panic attacks Stress management - busPIRone (BUSPAR) 15 MG tablet; TAKE (1) TABLET TWICE A DAY AS NEEDED.  Dispense: 180 tablet; Refill: 1  6.  Primary insomnia Bedtime routine - zolpidem (AMBIEN) 10 MG tablet; Take 1 tablet (10 mg total) by mouth at bedtime as needed for sleep.  Dispense: 30 tablet; Refill: 5  7. Seizures (HCC) - carbamazepine (TEGRETOL) 200 MG tablet; TAKE  (1)  TABLET  FOUR TIMES DAILY.  Dispense: 120 tablet; Refill: 5  8. BMI 26.0-26.9,adult Discussed diet and exercise for person with BMI >25 Will recheck weight in 3-6 months  9. Mild episode of recurrent major depressive disorder (HCC) - citalopram (CELEXA) 20 MG tablet; Take 1 tablet (20 mg total) by mouth daily.  Dispense: 90 tablet; Refill: 1   Labs pending Health Maintenance reviewed Diet and exercise encouraged  Follow up plan: 6 months     I discussed the assessment and treatment plan with the patient. The patient was provided an opportunity to ask questions and all were answered. The patient agreed with the plan and demonstrated an understanding of the instructions.   The patient was advised to call back or seek an in-person evaluation if the symptoms worsen or if the condition fails to improve as anticipated.  The above assessment and management plan was discussed with the patient.  The patient verbalized understanding of and has agreed to the management plan. Patient is aware to call the clinic if symptoms persist or worsen. Patient is aware when to return to the clinic for a follow-up visit. Patient educated on when it is appropriate to go to the emergency department.   Time call ended:  9:32  I provided 17 minutes of non-face-to-face time during this encounter.    Mary-Margaret Hassell Done, FNP

## 2019-07-29 LAB — CMP14+EGFR
ALT: 15 IU/L (ref 0–32)
AST: 13 IU/L (ref 0–40)
Albumin/Globulin Ratio: 1.6 (ref 1.2–2.2)
Albumin: 4 g/dL (ref 3.8–4.9)
Alkaline Phosphatase: 76 IU/L (ref 39–117)
BUN/Creatinine Ratio: 26 — ABNORMAL HIGH (ref 9–23)
BUN: 25 mg/dL — ABNORMAL HIGH (ref 6–24)
Bilirubin Total: <0.2 mg/dL (ref 0.0–1.2)
CO2: 26 mmol/L (ref 20–29)
Calcium: 9.5 mg/dL (ref 8.7–10.2)
Chloride: 101 mmol/L (ref 96–106)
Creatinine, Ser: 0.98 mg/dL (ref 0.57–1.00)
GFR calc Af Amer: 76 mL/min/{1.73_m2} (ref >59)
GFR calc non Af Amer: 66 mL/min/{1.73_m2} (ref >59)
Globulin, Total: 2.5 g/dL (ref 1.5–4.5)
Glucose: 145 mg/dL — ABNORMAL HIGH (ref 65–99)
Potassium: 4.5 mmol/L (ref 3.5–5.2)
Sodium: 138 mmol/L (ref 134–144)
Total Protein: 6.5 g/dL (ref 6.0–8.5)

## 2019-07-29 LAB — LIPID PANEL
Chol/HDL Ratio: 2.2 ratio (ref 0.0–4.4)
Cholesterol, Total: 142 mg/dL (ref 100–199)
HDL: 64 mg/dL (ref >39)
LDL Calculated: 60 mg/dL (ref 0–99)
Triglycerides: 92 mg/dL (ref 0–149)
VLDL Cholesterol Cal: 18 mg/dL (ref 5–40)

## 2019-08-29 ENCOUNTER — Ambulatory Visit: Payer: Medicaid Other | Admitting: Nurse Practitioner

## 2019-10-29 ENCOUNTER — Other Ambulatory Visit: Payer: Self-pay | Admitting: Nurse Practitioner

## 2019-11-25 ENCOUNTER — Other Ambulatory Visit: Payer: Self-pay | Admitting: Nurse Practitioner

## 2019-11-25 NOTE — Telephone Encounter (Signed)
Last office visit 07/28/2019

## 2019-12-31 ENCOUNTER — Other Ambulatory Visit: Payer: Self-pay | Admitting: Nurse Practitioner

## 2019-12-31 DIAGNOSIS — F410 Panic disorder [episodic paroxysmal anxiety] without agoraphobia: Secondary | ICD-10-CM

## 2019-12-31 DIAGNOSIS — E7800 Pure hypercholesterolemia, unspecified: Secondary | ICD-10-CM

## 2019-12-31 DIAGNOSIS — I10 Essential (primary) hypertension: Secondary | ICD-10-CM

## 2019-12-31 DIAGNOSIS — F330 Major depressive disorder, recurrent, mild: Secondary | ICD-10-CM

## 2019-12-31 DIAGNOSIS — R569 Unspecified convulsions: Secondary | ICD-10-CM

## 2020-01-27 ENCOUNTER — Ambulatory Visit: Payer: Medicaid Other | Admitting: Nurse Practitioner

## 2020-01-27 ENCOUNTER — Other Ambulatory Visit: Payer: Self-pay | Admitting: Nurse Practitioner

## 2020-01-27 DIAGNOSIS — F5101 Primary insomnia: Secondary | ICD-10-CM

## 2020-01-30 ENCOUNTER — Ambulatory Visit (INDEPENDENT_AMBULATORY_CARE_PROVIDER_SITE_OTHER): Payer: Medicaid Other | Admitting: Nurse Practitioner

## 2020-01-30 ENCOUNTER — Encounter: Payer: Self-pay | Admitting: Nurse Practitioner

## 2020-01-30 ENCOUNTER — Other Ambulatory Visit: Payer: Self-pay

## 2020-01-30 DIAGNOSIS — E119 Type 2 diabetes mellitus without complications: Secondary | ICD-10-CM | POA: Diagnosis not present

## 2020-01-30 DIAGNOSIS — I10 Essential (primary) hypertension: Secondary | ICD-10-CM

## 2020-01-30 DIAGNOSIS — M545 Low back pain, unspecified: Secondary | ICD-10-CM

## 2020-01-30 DIAGNOSIS — E7800 Pure hypercholesterolemia, unspecified: Secondary | ICD-10-CM | POA: Diagnosis not present

## 2020-01-30 DIAGNOSIS — K219 Gastro-esophageal reflux disease without esophagitis: Secondary | ICD-10-CM

## 2020-01-30 DIAGNOSIS — R569 Unspecified convulsions: Secondary | ICD-10-CM

## 2020-01-30 DIAGNOSIS — F5101 Primary insomnia: Secondary | ICD-10-CM | POA: Diagnosis not present

## 2020-01-30 DIAGNOSIS — F330 Major depressive disorder, recurrent, mild: Secondary | ICD-10-CM

## 2020-01-30 DIAGNOSIS — F410 Panic disorder [episodic paroxysmal anxiety] without agoraphobia: Secondary | ICD-10-CM | POA: Diagnosis not present

## 2020-01-30 DIAGNOSIS — G8929 Other chronic pain: Secondary | ICD-10-CM | POA: Diagnosis not present

## 2020-01-30 DIAGNOSIS — Z6826 Body mass index (BMI) 26.0-26.9, adult: Secondary | ICD-10-CM | POA: Diagnosis not present

## 2020-01-30 MED ORDER — CYCLOBENZAPRINE HCL 10 MG PO TABS: 10.0000 mg | ORAL_TABLET | Freq: Every day | ORAL | 1 refills | Status: DC

## 2020-01-30 MED ORDER — TRAMADOL HCL 50 MG PO TABS: 50.0000 mg | ORAL_TABLET | Freq: Three times a day (TID) | ORAL | 0 refills | Status: AC | PRN

## 2020-01-30 MED ORDER — BUSPIRONE HCL 15 MG PO TABS: 15.0000 mg | ORAL_TABLET | Freq: Two times a day (BID) | ORAL | 2 refills | Status: DC

## 2020-01-30 MED ORDER — LISINOPRIL-HYDROCHLOROTHIAZIDE 20-25 MG PO TABS: 1.0000 | ORAL_TABLET | Freq: Every day | ORAL | 1 refills | Status: DC

## 2020-01-30 MED ORDER — SIMVASTATIN 40 MG PO TABS: 40.0000 mg | ORAL_TABLET | Freq: Every day | ORAL | 1 refills | Status: DC

## 2020-01-30 MED ORDER — CARVEDILOL 6.25 MG PO TABS: 6.2500 mg | ORAL_TABLET | Freq: Two times a day (BID) | ORAL | 1 refills | Status: DC

## 2020-01-30 MED ORDER — ZOLPIDEM TARTRATE 10 MG PO TABS: 10.0000 mg | ORAL_TABLET | Freq: Every evening | ORAL | 5 refills | Status: DC | PRN

## 2020-01-30 MED ORDER — CITALOPRAM HYDROBROMIDE 20 MG PO TABS: 20.0000 mg | ORAL_TABLET | Freq: Every day | ORAL | 1 refills | Status: DC

## 2020-01-30 MED ORDER — CARBAMAZEPINE 200 MG PO TABS: ORAL_TABLET | ORAL | 0 refills | Status: DC

## 2020-01-30 NOTE — Progress Notes (Signed)
Virtual Visit via telephone Note Due to COVID-19 pandemic this visit was conducted virtually. This visit type was conducted due to national recommendations for restrictions regarding the COVID-19 Pandemic (e.g. social distancing, sheltering in place) in an effort to limit this patient's exposure and mitigate transmission in our community. All issues noted in this document were discussed and addressed.  A physical exam was not performed with this format.  I connected with Ellen Delgado on 01/30/20 at 11:00 by telephone and verified that I am speaking with the correct person using two identifiers. Ellen Delgado is currently located at home and her biyfriend is currently with her during visit. The provider, Mary-Margaret Daphine Deutscher, FNP is located in their office at time of visit.  I discussed the limitations, risks, security and privacy concerns of performing an evaluation and management service by telephone and the availability of in person appointments. I also discussed with the patient that there may be a patient responsible charge related to this service. The patient expressed understanding and agreed to proceed.   History and Present Illness:   Chief Complaint: Medical Management of Chronic Issues    HPI:  1. Essential hypertension No c/o chest pain, sob or headache. Does not check blood pressure at home. BP Readings from Last 3 Encounters:  01/27/19 99/61  09/17/18 110/79  07/25/18 109/75     2. Type 2 diabetes mellitus without complication, without long-term current use of insulin (HCC) fasting blood sugars running around 110-180. Denies nay low blood sugars.  Lab Results  Component Value Date   HGBA1C 6.2 07/28/2019      3. Pure hypercholesterolemia Has been trying to watch diet but does very little exercise. Lab Results  Component Value Date   CHOL 142 07/28/2019   HDL 64 07/28/2019   LDLCALC 60 07/28/2019   TRIG 92 07/28/2019   CHOLHDL 2.2 07/28/2019     4.  Gastroesophageal reflux disease without esophagitis Has been doing well as of late- is on no rx meds for this.  5. Seizures (HCC) She denies any seizure activity and is still tegratol daily. Has not seen neurologist in severla years.  6. Panic attacks Is on buspar and celexa and is doing well. Has occasional panic attacks but thinks that is coming form where both of her parents have died.  7. Primary insomnia Is on ambien at night to sleep.  8. BMI 26.0-26.9,adult No recent weight chnages Wt Readings from Last 3 Encounters:  01/27/19 175 lb 8 oz (79.6 kg)  09/17/18 176 lb (79.8 kg)  07/25/18 175 lb (79.4 kg)   BMI Readings from Last 3 Encounters:  01/27/19 32.10 kg/m  09/17/18 32.19 kg/m  07/25/18 32.01 kg/m       Outpatient Encounter Medications as of 01/30/2020  Medication Sig  . aspirin EC 81 MG tablet Take 1 tablet (81 mg total) by mouth daily.  . busPIRone (BUSPAR) 15 MG tablet TAKE (1) TABLET TWICE A DAY AS NEEDED.  . calcium-vitamin D (OSCAL WITH D) 500-200 MG-UNIT tablet Take 3 tablets by mouth daily.  . carbamazepine (TEGRETOL) 200 MG tablet TAKE  (1)  TABLET  FOUR TIMES DAILY.  . carvedilol (COREG) 6.25 MG tablet TAKE 1 TABLET TWICE DAILY WITH A MEAL.  . citalopram (CELEXA) 20 MG tablet TAKE 1 TABLET ONCE DAILY.  . cyclobenzaprine (FLEXERIL) 10 MG tablet TAKE 1 TABLET THREE TIMES DAILY AS NEEDED FOR MUSCLE SPASM .  Marland Kitchen lisinopril-hydrochlorothiazide (ZESTORETIC) 20-25 MG tablet TAKE 1 TABLET ONCE DAILY.  Marland Kitchen  metFORMIN (GLUCOPHAGE) 500 MG tablet TAKE 1 TABLET TWICE DAILY WITH A MEAL.  . Multiple Vitamin (MULTIVITAMIN) tablet Take 1 tablet by mouth daily.  . simvastatin (ZOCOR) 40 MG tablet TAKE 1 TABLET ONCE DAILY AT 6PM.  . zolpidem (AMBIEN) 10 MG tablet Take 1 tablet (10 mg total) by mouth at bedtime as needed for sleep.     Past Surgical History:  Procedure Laterality Date  . CHOLECYSTECTOMY      Family History  Problem Relation Age of Onset  . Diabetes  Mother   . Diabetes Father   . Coronary artery disease Father   . Cancer Paternal Grandmother     New complaints: Having slight back pain and needs a few ultram for when it is above 5/10  Social history: Lives with her boyfriend since both of her parents died  Controlled substance contract: needs new one signed at next face to face visit    Review of Systems  Constitutional: Negative for diaphoresis and weight loss.  Eyes: Negative for blurred vision, double vision and pain.  Respiratory: Negative for shortness of breath.   Cardiovascular: Negative for chest pain, palpitations, orthopnea and leg swelling.  Gastrointestinal: Negative for abdominal pain.  Skin: Negative for rash.  Neurological: Negative for dizziness, sensory change, loss of consciousness, weakness and headaches.  Endo/Heme/Allergies: Negative for polydipsia. Does not bruise/bleed easily.  Psychiatric/Behavioral: Negative for memory loss. The patient does not have insomnia.   All other systems reviewed and are negative.    Observations/Objective: Alert and oriented- answers all questions appropriately No distress    Assessment and Plan: Ellen Delgado comes in today with chief complaint of Medical Management of Chronic Issues   Diagnosis and orders addressed:  1. Essential hypertension Low sodium diet - lisinopril-hydrochlorothiazide (ZESTORETIC) 20-25 MG tablet; Take 1 tablet by mouth daily.  Dispense: 90 tablet; Refill: 1 - carvedilol (COREG) 6.25 MG tablet; Take 1 tablet (6.25 mg total) by mouth 2 (two) times daily with a meal.  Dispense: 180 tablet; Refill: 1  2. Type 2 diabetes mellitus without complication, without long-term current use of insulin (HCC) Watch carbs in diet  3. Pure hypercholesterolemia Low fat diet - simvastatin (ZOCOR) 40 MG tablet; Take 1 tablet (40 mg total) by mouth daily at 6 PM.  Dispense: 90 tablet; Refill: 1  4. Gastroesophageal reflux disease without esophagitis  Avoid spicy foods Do not eat 2 hours prior to bedtime  5. Seizures (HCC) reprt any seixure activity as soon as possible - carbamazepine (TEGRETOL) 200 MG tablet; TAKE  (1)  TABLET  FOUR TIMES DAILY.  Dispense: 120 tablet; Refill: 0  6. Panic attacks stres management - busPIRone (BUSPAR) 15 MG tablet; Take 1 tablet (15 mg total) by mouth 2 (two) times daily.  Dispense: 60 tablet; Refill: 2  7. Primary insomnia Bedtime routine - zolpidem (AMBIEN) 10 MG tablet; Take 1 tablet (10 mg total) by mouth at bedtime as needed for sleep.  Dispense: 30 tablet; Refill: 5  8. BMI 26.0-26.9,adult Discussed diet and exercise for person with BMI >25 Will recheck weight in 3-6 months  9. Mild episode of recurrent major depressive disorder (HCC) - citalopram (CELEXA) 20 MG tablet; Take 1 tablet (20 mg total) by mouth daily.  Dispense: 90 tablet; Refill: 1  10. Chronic midline low back pain without sciatica Moist heat and rest - cyclobenzaprine (FLEXERIL) 10 MG tablet; Take 1 tablet (10 mg total) by mouth at bedtime.  Dispense: 90 tablet; Refill: 1 - traMADol (ULTRAM) 50  MG tablet; Take 1 tablet (50 mg total) by mouth every 8 (eight) hours as needed for up to 5 days.  Dispense: 30 tablet; Refill: 0   Labs pending Health Maintenance reviewed Diet and exercise encouraged  Follow up plan: 3 months     I discussed the assessment and treatment plan with the patient. The patient was provided an opportunity to ask questions and all were answered. The patient agreed with the plan and demonstrated an understanding of the instructions.   The patient was advised to call back or seek an in-person evaluation if the symptoms worsen or if the condition fails to improve as anticipated.  The above assessment and management plan was discussed with the patient. The patient verbalized understanding of and has agreed to the management plan. Patient is aware to call the clinic if symptoms persist or worsen. Patient  is aware when to return to the clinic for a follow-up visit. Patient educated on when it is appropriate to go to the emergency department.   Time call ended:  11:20  I provided 20 minutes of non-face-to-face time during this encounter.    Mary-Margaret Hassell Done, FNP

## 2020-02-24 ENCOUNTER — Other Ambulatory Visit: Payer: Self-pay | Admitting: Nurse Practitioner

## 2020-02-24 DIAGNOSIS — R569 Unspecified convulsions: Secondary | ICD-10-CM

## 2020-03-16 ENCOUNTER — Telehealth: Payer: Self-pay | Admitting: Nurse Practitioner

## 2020-03-16 NOTE — Telephone Encounter (Signed)
Patient thinks she is allergic to the flu shot.  Tried to explain to her that per her chart she is allergic to tdap.  Patient is wanting her paper chart pulled to see if there is any documentation that she is allergic to flu.  This chart will be sent to [facility] and also mandy so she can follow up with patient when receiving her paper chart.

## 2020-03-16 NOTE — Telephone Encounter (Signed)
Why does she need to know that- you will have to ask Bjorn Loser or [place]

## 2020-03-16 NOTE — Telephone Encounter (Signed)
Per chart first visit in epic is 04/28/13- was a follow up appointment not a new patient appointment.  Do you know how long patient has been seeing you before we converted to epic?

## 2020-03-18 NOTE — Telephone Encounter (Signed)
Requesting chart

## 2020-03-24 NOTE — Telephone Encounter (Signed)
Chart given to [facility] for review.

## 2020-04-09 ENCOUNTER — Telehealth: Payer: Self-pay | Admitting: Nurse Practitioner

## 2020-05-07 ENCOUNTER — Ambulatory Visit: Payer: Medicaid Other | Admitting: Nurse Practitioner

## 2020-05-07 ENCOUNTER — Encounter: Payer: Self-pay | Admitting: Nurse Practitioner

## 2020-05-07 ENCOUNTER — Other Ambulatory Visit: Payer: Self-pay

## 2020-05-07 VITALS — BP 111/77 | Temp 97.4°F | Ht 62.0 in | Wt 204.0 lb

## 2020-05-07 DIAGNOSIS — Z6826 Body mass index (BMI) 26.0-26.9, adult: Secondary | ICD-10-CM | POA: Diagnosis not present

## 2020-05-07 DIAGNOSIS — F325 Major depressive disorder, single episode, in full remission: Secondary | ICD-10-CM

## 2020-05-07 DIAGNOSIS — R569 Unspecified convulsions: Secondary | ICD-10-CM

## 2020-05-07 DIAGNOSIS — E119 Type 2 diabetes mellitus without complications: Secondary | ICD-10-CM

## 2020-05-07 DIAGNOSIS — I10 Essential (primary) hypertension: Secondary | ICD-10-CM | POA: Diagnosis not present

## 2020-05-07 DIAGNOSIS — K219 Gastro-esophageal reflux disease without esophagitis: Secondary | ICD-10-CM

## 2020-05-07 DIAGNOSIS — E7800 Pure hypercholesterolemia, unspecified: Secondary | ICD-10-CM

## 2020-05-07 DIAGNOSIS — F5101 Primary insomnia: Secondary | ICD-10-CM | POA: Diagnosis not present

## 2020-05-07 DIAGNOSIS — M5136 Other intervertebral disc degeneration, lumbar region: Secondary | ICD-10-CM | POA: Diagnosis not present

## 2020-05-07 DIAGNOSIS — F410 Panic disorder [episodic paroxysmal anxiety] without agoraphobia: Secondary | ICD-10-CM

## 2020-05-07 LAB — BAYER DCA HB A1C WAIVED: HB A1C (BAYER DCA - WAIVED): 6.3 % (ref <7.0)

## 2020-05-07 MED ORDER — BLOOD GLUCOSE MONITOR KIT: PACK | 0 refills | Status: AC

## 2020-05-07 MED ORDER — CYCLOBENZAPRINE HCL 10 MG PO TABS: 10.0000 mg | ORAL_TABLET | Freq: Every day | ORAL | 1 refills | Status: DC

## 2020-05-07 MED ORDER — TRAMADOL HCL 50 MG PO TABS: 50.0000 mg | ORAL_TABLET | Freq: Two times a day (BID) | ORAL | 0 refills | Status: AC | PRN

## 2020-05-07 MED ORDER — BUSPIRONE HCL 15 MG PO TABS: 15.0000 mg | ORAL_TABLET | Freq: Two times a day (BID) | ORAL | 2 refills | Status: DC

## 2020-05-07 MED ORDER — CARVEDILOL 6.25 MG PO TABS: 6.2500 mg | ORAL_TABLET | Freq: Two times a day (BID) | ORAL | 1 refills | Status: DC

## 2020-05-07 MED ORDER — ZOLPIDEM TARTRATE 10 MG PO TABS: 10.0000 mg | ORAL_TABLET | Freq: Every evening | ORAL | 5 refills | Status: DC | PRN

## 2020-05-07 MED ORDER — LISINOPRIL-HYDROCHLOROTHIAZIDE 20-25 MG PO TABS: 1.0000 | ORAL_TABLET | Freq: Every day | ORAL | 1 refills | Status: DC

## 2020-05-07 MED ORDER — SIMVASTATIN 40 MG PO TABS: 40.0000 mg | ORAL_TABLET | Freq: Every day | ORAL | 1 refills | Status: DC

## 2020-05-07 MED ORDER — CARBAMAZEPINE 200 MG PO TABS: ORAL_TABLET | ORAL | 1 refills | Status: DC

## 2020-05-07 MED ORDER — CITALOPRAM HYDROBROMIDE 20 MG PO TABS: 20.0000 mg | ORAL_TABLET | Freq: Every day | ORAL | 1 refills | Status: DC

## 2020-05-07 MED ORDER — METFORMIN HCL 500 MG PO TABS: ORAL_TABLET | ORAL | 1 refills | Status: DC

## 2020-05-07 NOTE — Progress Notes (Signed)
Subjective:    Patient ID: Ellen Delgado, female    DOB: 07-10-1966, 53 y.o.   MRN: [id number]   Chief Complaint: Medical Management of Chronic Issues, Diabetes, and Hypertension    HPI:  1. Type 2 diabetes mellitus without complication, without long-term current use of insulin ([place]) Does not check blood sugar at home. Needs glucometer Lab Results  Component Value Date   HGBA1C 6.2 07/28/2019     2. Essential hypertension No c/o chest pan, ob or headache. Does not check blood pressure at home BP Readings from Last 3 Encounters:  05/07/20 111/77  01/27/19 99/61  09/17/18 110/79     3. Pure hypercholesterolemia Does not watch diet and doe very little exercise. Lab Results  Component Value Date   CHOL 142 07/28/2019   HDL 64 07/28/2019   LDLCALC 60 07/28/2019   TRIG 92 07/28/2019   CHOLHDL 2.2 07/28/2019     4. Gastroesophageal reflux disease without esophagitis On no prescripion medication for this. Has occasional heart burn, depending on what she ate.  5. Panic attacks Only ha occaionally. buspar really helps to keep her from having attacks GAD 7 : Generalized Anxiety Score 05/07/2020 09/17/2018  Nervous, Anxious, on Edge 2 1  Control/stop worrying 1 3  Worry too much - different things 1 3  Trouble relaxing 1 1  Restless 2 1  Easily annoyed or irritable 0 2  Afraid - awful might happen 0 2  Total GAD 7 Score 7 13  Anxiety Difficulty Somewhat difficult Not difficult at all     6. depression Is on celexa daily and I doing well. Depression screen [facility] 2/9 05/07/2020 07/28/2019 01/27/2019  Decreased Interest 0 0 1  Down, Depressed, Hopeless 0 0 0  PHQ - 2 Score 0 0 1  Altered sleeping - - -  Tired, decreased energy - - -  Change in appetite - - -  Feeling bad or failure about yourself  - - -  Trouble concentrating - - -  Moving slowly or fidgety/restless - - -  Suicidal thoughts - - -  PHQ-9 Score - - -  Difficult doing work/chores - - -    7.  Primary insomnia Takes ambien to sleep at night- usually only take 1/2 tablet at night  7. Seizures ([place]) Is still on tegratol daily and ha shad no seizure activity  8. DDD (degenerative disc disease), lumbar Has chronic back pain. take flexeril 2-3 x a day and that helps. Has been on ultram 74m which really helps. Took her 3 months to take 60 tablets  9. BMI 26.0-26.9,adult No recent weight changes Wt Readings from Last 3 Encounters:  05/07/20 204 lb (92.5 kg)  01/27/19 175 lb 8 oz (79.6 kg)  09/17/18 176 lb (79.8 kg)   BMI Readings from Last 3 Encounters:  05/07/20 37.31 kg/m  01/27/19 32.10 kg/m  09/17/18 32.19 kg/m       Outpatient Encounter Medications as of 05/07/2020  Medication Sig  . aspirin EC 81 MG tablet Take 1 tablet (81 mg total) by mouth daily. (Patient taking differently: Take 81 mg by mouth 4 (four) times a week. )  . busPIRone (BUSPAR) 15 MG tablet Take 1 tablet (15 mg total) by mouth 2 (two) times daily.  . calcium-vitamin D (OSCAL WITH D) 500-200 MG-UNIT tablet Take 3 tablets by mouth daily.  . carbamazepine (TEGRETOL) 200 MG tablet TAKE  (1)  TABLET  FOUR TIMES DAILY.  . carvedilol (COREG) 6.25 MG tablet  Take 1 tablet (6.25 mg total) by mouth 2 (two) times daily with a meal.  . citalopram (CELEXA) 20 MG tablet Take 1 tablet (20 mg total) by mouth daily.  . cyclobenzaprine (FLEXERIL) 10 MG tablet Take 1 tablet (10 mg total) by mouth at bedtime.  Marland Kitchen lisinopril-hydrochlorothiazide (ZESTORETIC) 20-25 MG tablet Take 1 tablet by mouth daily.  . metFORMIN (GLUCOPHAGE) 500 MG tablet TAKE 1 TABLET TWICE DAILY WITH A MEAL.  . Multiple Vitamin (MULTIVITAMIN) tablet Take 1 tablet by mouth daily.  . simvastatin (ZOCOR) 40 MG tablet Take 1 tablet (40 mg total) by mouth daily at 6 PM.  . zolpidem (AMBIEN) 10 MG tablet Take 1 tablet (10 mg total) by mouth at bedtime as needed for sleep.     Past Surgical History:  Procedure Laterality Date  . CHOLECYSTECTOMY       Family History  Problem Relation Age of Onset  . Diabetes Mother   . Diabetes Father   . Coronary artery disease Father   . Cancer Paternal Grandmother     New complaints: None today  Social history: Lives with her boyfriend   Controlled substance contract: *05/07/20    Review of Systems  Constitutional: Negative for diaphoresis.  Eyes: Negative for pain.  Respiratory: Negative for shortness of breath.   Cardiovascular: Negative for chest pain, palpitations and leg swelling.  Gastrointestinal: Negative for abdominal pain.  Endocrine: Negative for polydipsia.  Musculoskeletal: Positive for back pain.  Skin: Negative for rash.  Neurological: Negative for dizziness, weakness and headaches.  Hematological: Does not bruise/bleed easily.  All other systems reviewed and are negative.      Objective:   Physical Exam Vitals and nursing note reviewed.  Constitutional:      General: She is not in acute distress.    Appearance: Normal appearance. She is well-developed.  HENT:     Head: Normocephalic.     Nose: Nose normal.  Eyes:     Pupils: Pupils are equal, round, and reactive to light.  Neck:     Vascular: No carotid bruit or JVD.  Cardiovascular:     Rate and Rhythm: Normal rate and regular rhythm.     Heart sounds: Normal heart sounds.  Pulmonary:     Effort: Pulmonary effort is normal. No respiratory distress.     Breath sounds: Normal breath sounds. No wheezing or rales.  Chest:     Chest wall: No tenderness.  Abdominal:     General: Bowel sounds are normal. There is no distension or abdominal bruit.     Palpations: Abdomen is soft. There is no hepatomegaly, splenomegaly, mass or pulsatile mass.     Tenderness: There is no abdominal tenderness.  Musculoskeletal:        General: Normal range of motion.     Cervical back: Normal range of motion and neck supple.     Comments: rises slowly from sitting to standing (-) LR bil  Lymphadenopathy:     Cervical:  No cervical adenopathy.  Skin:    General: Skin is warm and dry.  Neurological:     Mental Status: She is alert and oriented to person, place, and time.     Deep Tendon Reflexes: Reflexes are normal and symmetric.  Psychiatric:        Behavior: Behavior normal.        Thought Content: Thought content normal.        Judgment: Judgment normal.    Blood pressure 111/77, temperature (!) 97.4 F (36.3  C), height _0  (1.575 m), weight 204 lb (92.5 kg).  HGBA1c 6.3%       Assessment & Plan:  Ellen Delgado comes in today with chief complaint of Medical Management of Chronic Issues, Diabetes, and Hypertension   Diagnosis and orders addressed:  1. Type 2 diabetes mellitus without complication, without long-term current use of insulin (HCC) Continue to watch carbs in diet - Bayer DCA Hb A1c Waived - metFORMIN (GLUCOPHAGE) 500 MG tablet; TAKE 1 TABLET TWICE DAILY WITH A MEAL.  Dispense: 180 tablet; Refill: 1  2. Essential hypertension Low sodium diet - carvedilol (COREG) 6.25 MG tablet; Take 1 tablet (6.25 mg total) by mouth 2 (two) times daily with a meal.  Dispense: 180 tablet; Refill: 1 - lisinopril-hydrochlorothiazide (ZESTORETIC) 20-25 MG tablet; Take 1 tablet by mouth daily.  Dispense: 90 tablet; Refill: 1  3. Pure hypercholesterolemia Low fat diet - simvastatin (ZOCOR) 40 MG tablet; Take 1 tablet (40 mg total) by mouth daily at 6 PM.  Dispense: 90 tablet; Refill: 1  4. Gastroesophageal reflux disease without esophagitis Avoid spicy foods Do not eat 2 hours prior to bedtime  5. Panic attacks Stress management - busPIRone (BUSPAR) 15 MG tablet; Take 1 tablet (15 mg total) by mouth 2 (two) times daily.  Dispense: 60 tablet; Refill: 2  6. Primary insomnia Bedtime routine - zolpidem (AMBIEN) 10 MG tablet; Take 1 tablet (10 mg total) by mouth at bedtime as needed for sleep.  Dispense: 30 tablet; Refill: 5  7. Seizures (HCC) - carbamazepine (TEGRETOL) 200 MG tablet; TAKE   (1)  TABLET  FOUR TIMES DAILY.  Dispense: 120 tablet; Refill: 1  8. DDD (degenerative disc disease), lumbar Ultram rx must last 3 months - blood glucose meter kit and supplies KIT; Checks blood sugars at home fating in morning and PRN  Dispense: 1 each; Refill: 0 - cyclobenzaprine (FLEXERIL) 10 MG tablet; Take 1 tablet (10 mg total) by mouth at bedtime.  Dispense: 90 tablet; Refill: 1 - traMADol (ULTRAM) 50 MG tablet; Take 1 tablet (50 mg total) by mouth 2 (two) times daily as needed.  Dispense: 60 tablet; Refill: 0  9. BMI 26.0-26.9,adult Discussed diet and exercise for person with BMI >25 Will recheck weight in 3-6 months  10. Depression, major, single episode, complete remission (HCC) Stress management - citalopram (CELEXA) 20 MG tablet; Take 1 tablet (20 mg total) by mouth daily.  Dispense: 90 tablet; Refill: 1   Labs pending Health Maintenance reviewed Diet and exercise encouraged  Follow up plan: 3 months   Mary-Margaret Hassell Done, FNP

## 2020-05-07 NOTE — Patient Instructions (Signed)

## 2020-05-07 NOTE — Addendum Note (Signed)
Addended by: Bennie Pierini on: 05/07/2020 12:41 PM   Modules accepted: Orders

## 2020-05-08 LAB — CMP14+EGFR
ALT: 16 IU/L (ref 0–32)
AST: 17 IU/L (ref 0–40)
Albumin/Globulin Ratio: 1.7 (ref 1.2–2.2)
Albumin: 4.2 g/dL (ref 3.8–4.9)
Alkaline Phosphatase: 81 IU/L (ref 48–121)
BUN/Creatinine Ratio: 24 — ABNORMAL HIGH (ref 9–23)
BUN: 23 mg/dL (ref 6–24)
Bilirubin Total: <0.2 mg/dL (ref 0.0–1.2)
CO2: 24 mmol/L (ref 20–29)
Calcium: 10 mg/dL (ref 8.7–10.2)
Chloride: 99 mmol/L (ref 96–106)
Creatinine, Ser: 0.94 mg/dL (ref 0.57–1.00)
GFR calc Af Amer: 80 mL/min/{1.73_m2} (ref >59)
GFR calc non Af Amer: 69 mL/min/{1.73_m2} (ref >59)
Globulin, Total: 2.5 g/dL (ref 1.5–4.5)
Glucose: 151 mg/dL — ABNORMAL HIGH (ref 65–99)
Potassium: 4.4 mmol/L (ref 3.5–5.2)
Sodium: 138 mmol/L (ref 134–144)
Total Protein: 6.7 g/dL (ref 6.0–8.5)

## 2020-05-08 LAB — CBC WITH DIFFERENTIAL/PLATELET
Basophils Absolute: 0 10*3/uL (ref 0.0–0.2)
Basos: 1 %
EOS (ABSOLUTE): 0.4 10*3/uL (ref 0.0–0.4)
Eos: 6 %
Hematocrit: 38.5 % (ref 34.0–46.6)
Hemoglobin: 12.7 g/dL (ref 11.1–15.9)
Immature Grans (Abs): 0 10*3/uL (ref 0.0–0.1)
Immature Granulocytes: 0 %
Lymphocytes Absolute: 1.7 10*3/uL (ref 0.7–3.1)
Lymphs: 26 %
MCH: 31.1 pg (ref 26.6–33.0)
MCHC: 33 g/dL (ref 31.5–35.7)
MCV: 94 fL (ref 79–97)
Monocytes Absolute: 0.7 10*3/uL (ref 0.1–0.9)
Monocytes: 11 %
Neutrophils Absolute: 3.7 10*3/uL (ref 1.4–7.0)
Neutrophils: 56 %
Platelets: 280 10*3/uL (ref 150–450)
RBC: 4.08 x10E6/uL (ref 3.77–5.28)
RDW: 12 % (ref 11.7–15.4)
WBC: 6.6 10*3/uL (ref 3.4–10.8)

## 2020-05-08 LAB — LIPID PANEL
Chol/HDL Ratio: 2.4 ratio (ref 0.0–4.4)
Cholesterol, Total: 154 mg/dL (ref 100–199)
HDL: 65 mg/dL (ref >39)
LDL Chol Calc (NIH): 69 mg/dL (ref 0–99)
Triglycerides: 111 mg/dL (ref 0–149)
VLDL Cholesterol Cal: 20 mg/dL (ref 5–40)

## 2020-05-12 ENCOUNTER — Telehealth: Payer: Self-pay | Admitting: Nurse Practitioner

## 2020-05-12 DIAGNOSIS — M5136 Other intervertebral disc degeneration, lumbar region: Secondary | ICD-10-CM

## 2020-05-12 MED ORDER — CYCLOBENZAPRINE HCL 10 MG PO TABS: 10.0000 mg | ORAL_TABLET | Freq: Three times a day (TID) | ORAL | 1 refills | Status: DC

## 2020-05-12 NOTE — Telephone Encounter (Signed)
Patient's Flexeril refill was written as taking once at bedtime, patient normally takes three times per day as needed.  She is concerned that the prescription has been changed.  Please advise.  Patient aware you are out of the office until tomorrow.

## 2020-05-12 NOTE — Telephone Encounter (Signed)
Patient aware, script is ready. 

## 2020-05-12 NOTE — Telephone Encounter (Signed)
Resent rx with correction to 3x a day.but she on ly needs to take it when needed. If he need iyt 3x a day everyday then need to see specialist

## 2020-06-23 ENCOUNTER — Telehealth: Payer: Self-pay | Admitting: Nurse Practitioner

## 2020-06-24 ENCOUNTER — Other Ambulatory Visit: Payer: Self-pay | Admitting: *Deleted

## 2020-06-24 DIAGNOSIS — M5136 Other intervertebral disc degeneration, lumbar region: Secondary | ICD-10-CM

## 2020-06-24 DIAGNOSIS — R569 Unspecified convulsions: Secondary | ICD-10-CM

## 2020-06-24 MED ORDER — CARBAMAZEPINE 200 MG PO TABS: ORAL_TABLET | ORAL | 1 refills | Status: DC

## 2020-06-24 MED ORDER — CYCLOBENZAPRINE HCL 10 MG PO TABS: 10.0000 mg | ORAL_TABLET | Freq: Three times a day (TID) | ORAL | 1 refills | Status: DC

## 2020-06-24 NOTE — Telephone Encounter (Signed)
Approved today Request Reference Number: [id number]. TEGRETOL TAB 200MG  is approved through 06/24/2021  Pt and pharm called and aware

## 2020-06-24 NOTE — Telephone Encounter (Signed)
Pa came in today for pt - Tegretol 200 mg tab  She has been on this for years and has a new insurance plan   I have LM letting pt know that I am working on this for her.  Key: [id number] Sent to plan today - sent as urgent

## 2020-07-16 LAB — HM DIABETES EYE EXAM

## 2020-08-17 ENCOUNTER — Ambulatory Visit: Payer: Self-pay | Admitting: Nurse Practitioner

## 2020-08-18 ENCOUNTER — Other Ambulatory Visit: Payer: Self-pay | Admitting: Nurse Practitioner

## 2020-08-18 DIAGNOSIS — F410 Panic disorder [episodic paroxysmal anxiety] without agoraphobia: Secondary | ICD-10-CM

## 2020-08-20 ENCOUNTER — Other Ambulatory Visit: Payer: Self-pay

## 2020-08-20 ENCOUNTER — Ambulatory Visit (INDEPENDENT_AMBULATORY_CARE_PROVIDER_SITE_OTHER): Payer: Medicaid Other | Admitting: Nurse Practitioner

## 2020-08-20 ENCOUNTER — Encounter: Payer: Self-pay | Admitting: Nurse Practitioner

## 2020-08-20 VITALS — BP 122/81 | HR 75 | Temp 97.5°F | Resp 20 | Ht 62.0 in | Wt 206.0 lb

## 2020-08-20 DIAGNOSIS — F325 Major depressive disorder, single episode, in full remission: Secondary | ICD-10-CM

## 2020-08-20 DIAGNOSIS — F410 Panic disorder [episodic paroxysmal anxiety] without agoraphobia: Secondary | ICD-10-CM | POA: Diagnosis not present

## 2020-08-20 DIAGNOSIS — R569 Unspecified convulsions: Secondary | ICD-10-CM | POA: Diagnosis not present

## 2020-08-20 DIAGNOSIS — E119 Type 2 diabetes mellitus without complications: Secondary | ICD-10-CM | POA: Diagnosis not present

## 2020-08-20 DIAGNOSIS — I10 Essential (primary) hypertension: Secondary | ICD-10-CM

## 2020-08-20 DIAGNOSIS — F5101 Primary insomnia: Secondary | ICD-10-CM

## 2020-08-20 DIAGNOSIS — M5136 Other intervertebral disc degeneration, lumbar region: Secondary | ICD-10-CM | POA: Diagnosis not present

## 2020-08-20 DIAGNOSIS — Z6826 Body mass index (BMI) 26.0-26.9, adult: Secondary | ICD-10-CM | POA: Diagnosis not present

## 2020-08-20 DIAGNOSIS — E7800 Pure hypercholesterolemia, unspecified: Secondary | ICD-10-CM | POA: Diagnosis not present

## 2020-08-20 LAB — BAYER DCA HB A1C WAIVED: HB A1C (BAYER DCA - WAIVED): 6.3 % (ref <7.0)

## 2020-08-20 MED ORDER — BUSPIRONE HCL 15 MG PO TABS: 15.0000 mg | ORAL_TABLET | Freq: Two times a day (BID) | ORAL | 2 refills | Status: DC

## 2020-08-20 MED ORDER — LISINOPRIL-HYDROCHLOROTHIAZIDE 20-25 MG PO TABS: 1.0000 | ORAL_TABLET | Freq: Every day | ORAL | 1 refills | Status: DC

## 2020-08-20 MED ORDER — METFORMIN HCL 500 MG PO TABS: ORAL_TABLET | ORAL | 1 refills | Status: DC

## 2020-08-20 MED ORDER — TRAMADOL HCL 50 MG PO TABS: 50.0000 mg | ORAL_TABLET | Freq: Three times a day (TID) | ORAL | 0 refills | Status: AC | PRN

## 2020-08-20 MED ORDER — CARVEDILOL 6.25 MG PO TABS: 6.2500 mg | ORAL_TABLET | Freq: Two times a day (BID) | ORAL | 1 refills | Status: DC

## 2020-08-20 MED ORDER — SIMVASTATIN 40 MG PO TABS: 40.0000 mg | ORAL_TABLET | Freq: Every day | ORAL | 1 refills | Status: DC

## 2020-08-20 MED ORDER — ZOLPIDEM TARTRATE 10 MG PO TABS: 10.0000 mg | ORAL_TABLET | Freq: Every evening | ORAL | 5 refills | Status: DC | PRN

## 2020-08-20 MED ORDER — CITALOPRAM HYDROBROMIDE 20 MG PO TABS: 20.0000 mg | ORAL_TABLET | Freq: Every day | ORAL | 1 refills | Status: DC

## 2020-08-20 MED ORDER — CYCLOBENZAPRINE HCL 10 MG PO TABS: 10.0000 mg | ORAL_TABLET | Freq: Three times a day (TID) | ORAL | 1 refills | Status: DC

## 2020-08-20 MED ORDER — CARBAMAZEPINE 200 MG PO TABS: ORAL_TABLET | ORAL | 1 refills | Status: DC

## 2020-08-20 NOTE — Progress Notes (Signed)
Subjective:    Patient ID: Ellen Delgado, female    DOB: 08/01/1966, 54 y.o.   MRN: [id number]   Chief Complaint: Medical Management of Chronic Issues    HPI:  1. Type 2 diabetes mellitus without complication, without long-term current use of insulin ([place]) Lab Results  Component Value Date   HGBA1C 6.3 05/07/2020  Checks blood glucose at home regularly, ~100-115 at home. Denies watches diet. Takes medication as prescribed. HA1C is 6.3% today.   2. Essential hypertension BP Readings from Last 3 Encounters:  08/20/20 122/81  05/07/20 111/77  01/27/19 99/61  Checks blood pressure at home irregularly. Takes medication as prescribed. Denies chest pain, SOB, HA, or swelling.   3. Pure hypercholesterolemia Lab Results  Component Value Date   CHOL 154 05/07/2020   HDL 65 05/07/2020   LDLCALC 69 05/07/2020   TRIG 111 05/07/2020   CHOLHDL 2.4 05/07/2020  Takes medication as prescribed. Avoids fast food.  The 10-year ASCVD risk score Mikey Bussing DC Brooke Bonito., et al., 2013) is: 2.7%   Values used to calculate the score:     Age: 74 years     Sex: Female     Is Non-Hispanic African American: No     Diabetic: Yes     Tobacco smoker: No     Systolic Blood Pressure: 035 mmHg     Is BP treated: Yes     HDL Cholesterol: 65 mg/dL     Total Cholesterol: 154 mg/dL   4. BMI 26.0-26.9,adult BMI Readings from Last 3 Encounters:  08/20/20 37.68 kg/m  05/07/20 37.31 kg/m  01/27/19 32.10 kg/m   BP Readings from Last 3 Encounters:  08/20/20 122/81  05/07/20 111/77  01/27/19 99/61  Denies formal exercise due to hip/back pain, does yard work.    5. Depression, major, single episode, complete remission [facility]) Depression screen [facility] 2/9 08/20/2020 05/07/2020 07/28/2019 01/27/2019 09/17/2018  Decreased Interest 1 0 0 1 0  Down, Depressed, Hopeless 0 0 0 0 1  PHQ - 2 Score 1 0 0 1 1  Altered sleeping 0 - - - -  Tired, decreased energy 0 - - - -  Change in appetite 1 - - - -  Feeling bad or  failure about yourself  0 - - - -  Trouble concentrating 0 - - - -  Moving slowly or fidgety/restless 0 - - - -  Suicidal thoughts 0 - - - -  PHQ-9 Score 2 - - - -  Difficult doing work/chores Not difficult at all - - - -  States symptoms are under control.   6. DDD Pain assessment: Cause of pain- DDD Pain location- low back pain Pain on scale of 1-10- 8/10 currently Frequency- daioly What increases pain-to much work or standing What makes pain Better-rest Effects on ADL - none Any change in general medical condition-none  Current opioids rx- ultram 61m daioly # meds rx- 30 Effectiveness of current meds-none Adverse reactions from pain meds-none Morphine equivalent- 15MME  Pill count performed-No Last drug screen - 09/17/18 ( high risk [id number] risk [id number] risk yearly ) Urine drug screen today- Yes Was the [place]- yes  If yes were their any concerning findings? - no   Overdose risk: 0   Pain contract signed on: 05/07/20   Outpatient Encounter Medications as of 08/20/2020  Medication Sig  . aspirin EC 81 MG tablet Take 1 tablet (81 mg total) by mouth daily. (Patient taking differently: Take 81 mg by mouth  4 (four) times a week. )  . blood glucose meter kit and supplies KIT Checks blood sugars at home fating in morning and PRN  . busPIRone (BUSPAR) 15 MG tablet TAKE ONE TABLET TWICE A DAY.  . calcium-vitamin D (OSCAL WITH D) 500-200 MG-UNIT tablet Take 3 tablets by mouth daily.  . carbamazepine (TEGRETOL) 200 MG tablet TAKE  (1)  TABLET  FOUR TIMES DAILY.  . carvedilol (COREG) 6.25 MG tablet Take 1 tablet (6.25 mg total) by mouth 2 (two) times daily with a meal.  . citalopram (CELEXA) 20 MG tablet Take 1 tablet (20 mg total) by mouth daily.  . cyclobenzaprine (FLEXERIL) 10 MG tablet Take 1 tablet (10 mg total) by mouth 3 (three) times daily.  Marland Kitchen lisinopril-hydrochlorothiazide (ZESTORETIC) 20-25 MG tablet Take 1 tablet by mouth daily.  . metFORMIN  (GLUCOPHAGE) 500 MG tablet TAKE 1 TABLET TWICE DAILY WITH A MEAL.  . Multiple Vitamin (MULTIVITAMIN) tablet Take 1 tablet by mouth daily.  . simvastatin (ZOCOR) 40 MG tablet Take 1 tablet (40 mg total) by mouth daily at 6 PM.  . zolpidem (AMBIEN) 10 MG tablet Take 1 tablet (10 mg total) by mouth at bedtime as needed for sleep.     Past Surgical History:  Procedure Laterality Date  . CHOLECYSTECTOMY      Family History  Problem Relation Age of Onset  . Diabetes Mother   . Diabetes Father   . Coronary artery disease Father   . Cancer Paternal Grandmother     New complaints: C/o right upper, posterior arm pain x 3 days. States it feels as though muscles 'twist' in pain. Takes tramadol & flexeril with relief. Requests refills.   Social history: Lives at home with boyfriend, goes to [employer], goes out to eat once in a while.   Controlled substance contract: signed 05/07/20   Review of Systems  Constitutional: Negative.   HENT: Negative.   Eyes: Negative.   Respiratory: Negative.   Cardiovascular: Negative.   Gastrointestinal: Negative.   Endocrine: Negative.   Genitourinary: Negative.   Musculoskeletal: Negative.   Skin: Negative.   Allergic/Immunologic: Negative.   Neurological: Negative.   Hematological: Negative.   Psychiatric/Behavioral: Negative.   All other systems reviewed and are negative.     Objective:   Physical Exam Vitals and nursing note reviewed.  Constitutional:      Appearance: Normal appearance.  HENT:     Head: Normocephalic and atraumatic.     Right Ear: Tympanic membrane, ear canal and external ear normal.     Left Ear: Tympanic membrane, ear canal and external ear normal.     Nose: Nose normal.     Mouth/Throat:     Mouth: Mucous membranes are moist.     Pharynx: Oropharynx is clear.  Eyes:     Extraocular Movements: Extraocular movements intact.     Conjunctiva/sclera: Conjunctivae normal.     Pupils: Pupils are equal,  round, and reactive to light.  Cardiovascular:     Rate and Rhythm: Normal rate and regular rhythm.     Pulses: Normal pulses.     Heart sounds: Normal heart sounds.  Pulmonary:     Effort: Pulmonary effort is normal.     Breath sounds: Normal breath sounds.  Abdominal:     General: Bowel sounds are normal.     Palpations: Abdomen is soft.  Musculoskeletal:        General: Normal range of motion.     Cervical back: Normal range  of motion.  Skin:    General: Skin is warm and dry.     Capillary Refill: Capillary refill takes less than 2 seconds.  Neurological:     General: No focal deficit present.     Mental Status: She is alert and oriented to person, place, and time. Mental status is at baseline.  Psychiatric:        Mood and Affect: Mood normal.        Behavior: Behavior normal.        Thought Content: Thought content normal.        Judgment: Judgment normal.    BP 122/81   Pulse 75   Temp (!) 97.5 F (36.4 C) (Temporal)   Resp 20   Ht '5\' 2"'  (1.575 m)   Wt 206 lb (93.4 kg)   SpO2 95%   BMI 37.68 kg/m       Assessment & Plan:  Ellen Delgado comes in today with chief complaint of Medical Management of Chronic Issues   Diagnosis and orders addressed:  1. Type 2 diabetes mellitus without complication, without long-term current use of insulin (HCC) Check blood glucose level regularly and take medication as prescribed. Avoid foods high in carb and sugar content.   - Bayer DCA Hb A1c Waived - Microalbumin / creatinine urine ratio  2. Essential hypertension Check blood pressure regularly and take medication as prescribed. Eat a heart healthy diet and avoid a high salt intake.   - CBC with Differential/Platelet - CMP14+EGFR  3. Pure hypercholesterolemia Take medication as prescribed and avoid foods high in fat or that are fried.   - Lipid panel  4. BMI 26.0-26.9,adult Stay active. Walking is a great cardiovascular exercise that lowers blood pressure, glucose  levels, and cholesterol levels.   5. Depression, major, single episode, complete remission [facility]) Practice stress management.   Meds ordered this encounter  Medications  . traMADol (ULTRAM) 50 MG tablet    Sig: Take 1 tablet (50 mg total) by mouth every 8 (eight) hours as needed for up to 5 days.    Dispense:  30 tablet    Refill:  0    Order Specific Question:   Supervising Provider    Answer:   Caryl Pina A [id number]  . cyclobenzaprine (FLEXERIL) 10 MG tablet    Sig: Take 1 tablet (10 mg total) by mouth 3 (three) times daily.    Dispense:  90 tablet    Refill:  1    Order Specific Question:   Supervising Provider    Answer:   Caryl Pina A [id number]  . citalopram (CELEXA) 20 MG tablet    Sig: Take 1 tablet (20 mg total) by mouth daily.    Dispense:  90 tablet    Refill:  1    Order Specific Question:   Supervising Provider    Answer:   Caryl Pina A [id number]  . carvedilol (COREG) 6.25 MG tablet    Sig: Take 1 tablet (6.25 mg total) by mouth 2 (two) times daily with a meal.    Dispense:  180 tablet    Refill:  1    Order Specific Question:   Supervising Provider    Answer:   Caryl Pina A [[id number]]  . lisinopril-hydrochlorothiazide (ZESTORETIC) 20-25 MG tablet    Sig: Take 1 tablet by mouth daily.    Dispense:  90 tablet    Refill:  1    Order Specific Question:   Supervising Provider    Answer:  DETTINGER, JOSHUA A [[id number]]  . busPIRone (BUSPAR) 15 MG tablet    Sig: Take 1 tablet (15 mg total) by mouth 2 (two) times daily.    Dispense:  60 tablet    Refill:  2    Order Specific Question:   Supervising Provider    Answer:   Caryl Pina A [id number]  . zolpidem (AMBIEN) 10 MG tablet    Sig: Take 1 tablet (10 mg total) by mouth at bedtime as needed for sleep.    Dispense:  30 tablet    Refill:  5    Order Specific Question:   Supervising Provider    Answer:   Caryl Pina A [id number]  . simvastatin (ZOCOR) 40 MG tablet    Sig: Take  1 tablet (40 mg total) by mouth daily at 6 PM.    Dispense:  90 tablet    Refill:  1    Order Specific Question:   Supervising Provider    Answer:   Caryl Pina A [id number]  . carbamazepine (TEGRETOL) 200 MG tablet    Sig: TAKE  (1)  TABLET  FOUR TIMES DAILY.    Dispense:  120 tablet    Refill:  1    Order Specific Question:   Supervising Provider    Answer:   Caryl Pina A [id number]  . metFORMIN (GLUCOPHAGE) 500 MG tablet    Sig: TAKE 1 TABLET TWICE DAILY WITH A MEAL.    Dispense:  180 tablet    Refill:  1    Order Specific Question:   Supervising Provider    Answer:   Caryl Pina A [id number]     Labs pending Health Maintenance reviewed Diet and exercise encouraged  Follow up plan: Follow up in 3 months.    Mary-Margaret Hassell Done, FNP

## 2020-08-20 NOTE — Patient Instructions (Signed)
Managing Stress, Adult Feeling a certain amount of stress is normal. Stress helps our body and mind get ready to deal with the demands of life. Stress hormones can motivate you to do well at work and meet your responsibilities. However severe or long-lasting (chronic) stress can affect your mental and physical health. Chronic stress puts you at higher risk for anxiety, depression, and other health problems like digestive problems, muscle aches, heart disease, high blood pressure, and stroke. What are the causes? Common causes of stress include:  Demands from work, such as deadlines, feeling overworked, or having long hours.  Pressures at home, such as money issues, disagreements with a spouse, or parenting issues.  Pressures from major life changes, such as divorce, moving, loss of a loved one, or chronic illness. You may be at higher risk for stress-related problems if you do not get enough sleep, are in poor health, do not have emotional support, or have a mental health disorder like anxiety or depression. How to recognize stress Stress can make you:  Have trouble sleeping.  Feel sad, anxious, irritable, or overwhelmed.  Lose your appetite.  Overeat or want to eat unhealthy foods.  Want to use drugs or alcohol. Stress can also cause physical symptoms, such as:  Sore, tense muscles, especially in the shoulders and neck.  Headaches.  Trouble breathing.  A faster heart rate.  Stomach pain, nausea, or vomiting.  Diarrhea or constipation.  Trouble concentrating. Follow these instructions at home: Lifestyle  Identify the source of your stress and your reaction to it. See a therapist who can help you change your reactions.  When there are stressful events: ? Talk about it with family, friends, or co-workers. ? Try to think realistically about stressful events and not ignore them or overreact. ? Try to find the positives in a stressful situation and not focus on the  negatives. ? Cut back on responsibilities at work and home, if possible. Ask for help from friends or family members if you need it.  Find ways to cope with stress, such as: ? Meditation. ? Deep breathing. ? Yoga or tai chi. ? Progressive muscle relaxation. ? Doing art, playing music, or reading. ? Making time for fun activities. ? Spending time with family and friends.  Get support from family, friends, or spiritual resources. Eating and drinking  Eat a healthy diet. This includes: ? Eating foods that are high in fiber, such as beans, whole grains, and fresh fruits and vegetables. ? Limiting foods that are high in fat and processed sugars, such as fried and sweet foods.  Do not skip meals or overeat.  Drink enough fluid to keep your urine pale yellow. Alcohol use  Do not drink alcohol if: ? Your health care provider tells you not to drink. ? You are pregnant, may be pregnant, or are planning to become pregnant.  Drinking alcohol is a way some people try to ease their stress. This can be dangerous, so if you drink alcohol: ? Limit how much you use to:  0-1 drink a day for women.  0-2 drinks a day for men. ? Be aware of how much alcohol is in your drink. In the [place] one drink equals one 12 oz bottle of beer (355 mL), one 5 oz glass of wine (148 mL), or one 1 oz glass of hard liquor (44 mL). Activity   Include 30 minutes of exercise in your daily schedule. Exercise is a good stress reducer.  Include time in your day   for an activity that you find relaxing. Try taking a walk, going on a bike ride, reading a book, or listening to music.  Schedule your time in a way that lowers stress, and keep a consistent schedule. Prioritize what is most important to get done. General instructions  Get enough sleep. Try to go to sleep and get up at about the same time every day.  Take over-the-counter and prescription medicines only as told by your health care provider.  Do not use any  products that contain nicotine or tobacco, such as cigarettes, e-cigarettes, and chewing tobacco. If you need help quitting, ask your health care provider.  Do not use drugs or smoke to cope with stress.  Keep all follow-up visits as told by your health care provider. This is important. Where to find support  Talk with your health care provider about stress management or finding a support group.  Find a therapist to work with you on your stress management techniques. Contact a health care provider if:  Your stress symptoms get worse.  You are unable to manage your stress at home.  You are struggling to stop using drugs or alcohol. Get help right away if:  You may be a danger to yourself or others.  You have any thoughts of death or suicide. If you ever feel like you may hurt yourself or others, or have thoughts about taking your own life, get help right away. You can go to your nearest emergency department or call:  Your local emergency services (911 in the [place]  A suicide crisis helpline, such as the [place] at 786-499-0900. This is open 24 hours a day. Summary  Feeling a certain amount of stress is normal, but severe or long-lasting (chronic) stress can affect your mental and physical health.  Chronic stress can put you at higher risk for anxiety, depression, and other health problems like digestive problems, muscle aches, heart disease, high blood pressure, and stroke.  You may be at higher risk for stress-related problems if you do not get enough sleep, are in poor health, lack emotional support, or have a mental health disorder like anxiety or depression.  Identify the source of your stress and your reaction to it. Try talking about stressful events with family, friends, or co-workers, finding a coping method, or getting support from spiritual resources.  If you need more help, talk with your health care provider about finding a support group  or a mental health therapist. This information is not intended to replace advice given to you by your health care provider. Make sure you discuss any questions you have with your health care provider. Document Revised: 06/25/2019 Document Reviewed: 06/25/2019 Elsevier Patient Education  [place].

## 2020-08-21 LAB — CBC WITH DIFFERENTIAL/PLATELET
Basophils Absolute: 0 10*3/uL (ref 0.0–0.2)
Basos: 1 %
EOS (ABSOLUTE): 0.3 10*3/uL (ref 0.0–0.4)
Eos: 4 %
Hematocrit: 37.7 % (ref 34.0–46.6)
Hemoglobin: 12.7 g/dL (ref 11.1–15.9)
Immature Grans (Abs): 0 10*3/uL (ref 0.0–0.1)
Immature Granulocytes: 0 %
Lymphocytes Absolute: 1.7 10*3/uL (ref 0.7–3.1)
Lymphs: 25 %
MCH: 30.7 pg (ref 26.6–33.0)
MCHC: 33.7 g/dL (ref 31.5–35.7)
MCV: 91 fL (ref 79–97)
Monocytes Absolute: 0.6 10*3/uL (ref 0.1–0.9)
Monocytes: 8 %
Neutrophils Absolute: 4.3 10*3/uL (ref 1.4–7.0)
Neutrophils: 62 %
Platelets: 272 10*3/uL (ref 150–450)
RBC: 4.14 x10E6/uL (ref 3.77–5.28)
RDW: 12.2 % (ref 11.7–15.4)
WBC: 6.9 10*3/uL (ref 3.4–10.8)

## 2020-08-21 LAB — LIPID PANEL
Chol/HDL Ratio: 2.8 ratio (ref 0.0–4.4)
Cholesterol, Total: 189 mg/dL (ref 100–199)
HDL: 67 mg/dL (ref >39)
LDL Chol Calc (NIH): 96 mg/dL (ref 0–99)
Triglycerides: 153 mg/dL — ABNORMAL HIGH (ref 0–149)
VLDL Cholesterol Cal: 26 mg/dL (ref 5–40)

## 2020-08-21 LAB — CMP14+EGFR
ALT: 21 IU/L (ref 0–32)
AST: 22 IU/L (ref 0–40)
Albumin/Globulin Ratio: 1.6 (ref 1.2–2.2)
Albumin: 4.2 g/dL (ref 3.8–4.9)
Alkaline Phosphatase: 86 IU/L (ref 48–121)
BUN/Creatinine Ratio: 22 (ref 9–23)
BUN: 21 mg/dL (ref 6–24)
Bilirubin Total: 0.2 mg/dL (ref 0.0–1.2)
CO2: 28 mmol/L (ref 20–29)
Calcium: 11.4 mg/dL — ABNORMAL HIGH (ref 8.7–10.2)
Chloride: 96 mmol/L (ref 96–106)
Creatinine, Ser: 0.95 mg/dL (ref 0.57–1.00)
GFR calc Af Amer: 79 mL/min/{1.73_m2} (ref >59)
GFR calc non Af Amer: 68 mL/min/{1.73_m2} (ref >59)
Globulin, Total: 2.6 g/dL (ref 1.5–4.5)
Glucose: 148 mg/dL — ABNORMAL HIGH (ref 65–99)
Potassium: 4.6 mmol/L (ref 3.5–5.2)
Sodium: 137 mmol/L (ref 134–144)
Total Protein: 6.8 g/dL (ref 6.0–8.5)

## 2020-09-21 ENCOUNTER — Other Ambulatory Visit: Payer: Self-pay | Admitting: Nurse Practitioner

## 2020-11-15 ENCOUNTER — Other Ambulatory Visit: Payer: Self-pay | Admitting: Nurse Practitioner

## 2020-11-15 DIAGNOSIS — R569 Unspecified convulsions: Secondary | ICD-10-CM

## 2020-12-15 ENCOUNTER — Other Ambulatory Visit: Payer: Self-pay | Admitting: Nurse Practitioner

## 2020-12-15 DIAGNOSIS — F410 Panic disorder [episodic paroxysmal anxiety] without agoraphobia: Secondary | ICD-10-CM

## 2020-12-24 DIAGNOSIS — Z23 Encounter for immunization: Secondary | ICD-10-CM | POA: Diagnosis not present

## 2021-01-11 ENCOUNTER — Other Ambulatory Visit: Payer: Self-pay | Admitting: Nurse Practitioner

## 2021-01-11 DIAGNOSIS — M5136 Other intervertebral disc degeneration, lumbar region: Secondary | ICD-10-CM

## 2021-02-17 ENCOUNTER — Ambulatory Visit: Payer: Self-pay | Admitting: Nurse Practitioner

## 2021-02-18 ENCOUNTER — Encounter: Payer: Self-pay | Admitting: Nurse Practitioner

## 2021-02-18 ENCOUNTER — Ambulatory Visit (INDEPENDENT_AMBULATORY_CARE_PROVIDER_SITE_OTHER): Payer: Medicaid Other | Admitting: Nurse Practitioner

## 2021-02-18 ENCOUNTER — Other Ambulatory Visit: Payer: Self-pay

## 2021-02-18 VITALS — BP 120/84 | HR 80 | Temp 97.2°F | Resp 20 | Ht 62.0 in | Wt 208.0 lb

## 2021-02-18 DIAGNOSIS — Z1212 Encounter for screening for malignant neoplasm of rectum: Secondary | ICD-10-CM

## 2021-02-18 DIAGNOSIS — F5101 Primary insomnia: Secondary | ICD-10-CM

## 2021-02-18 DIAGNOSIS — R569 Unspecified convulsions: Secondary | ICD-10-CM | POA: Diagnosis not present

## 2021-02-18 DIAGNOSIS — M5136 Other intervertebral disc degeneration, lumbar region: Secondary | ICD-10-CM | POA: Diagnosis not present

## 2021-02-18 DIAGNOSIS — F410 Panic disorder [episodic paroxysmal anxiety] without agoraphobia: Secondary | ICD-10-CM | POA: Diagnosis not present

## 2021-02-18 DIAGNOSIS — Z1211 Encounter for screening for malignant neoplasm of colon: Secondary | ICD-10-CM | POA: Diagnosis not present

## 2021-02-18 DIAGNOSIS — E7800 Pure hypercholesterolemia, unspecified: Secondary | ICD-10-CM

## 2021-02-18 DIAGNOSIS — Z6826 Body mass index (BMI) 26.0-26.9, adult: Secondary | ICD-10-CM | POA: Diagnosis not present

## 2021-02-18 DIAGNOSIS — I10 Essential (primary) hypertension: Secondary | ICD-10-CM | POA: Diagnosis not present

## 2021-02-18 DIAGNOSIS — E119 Type 2 diabetes mellitus without complications: Secondary | ICD-10-CM

## 2021-02-18 DIAGNOSIS — F325 Major depressive disorder, single episode, in full remission: Secondary | ICD-10-CM | POA: Diagnosis not present

## 2021-02-18 DIAGNOSIS — M51369 Other intervertebral disc degeneration, lumbar region without mention of lumbar back pain or lower extremity pain: Secondary | ICD-10-CM

## 2021-02-18 DIAGNOSIS — K219 Gastro-esophageal reflux disease without esophagitis: Secondary | ICD-10-CM | POA: Diagnosis not present

## 2021-02-18 LAB — BAYER DCA HB A1C WAIVED: HB A1C (BAYER DCA - WAIVED): 6.7 % (ref <7.0)

## 2021-02-18 MED ORDER — SIMVASTATIN 40 MG PO TABS: 40.0000 mg | ORAL_TABLET | Freq: Every day | ORAL | 1 refills | Status: DC

## 2021-02-18 MED ORDER — CITALOPRAM HYDROBROMIDE 20 MG PO TABS: 20.0000 mg | ORAL_TABLET | Freq: Every day | ORAL | 1 refills | Status: DC

## 2021-02-18 MED ORDER — TRAMADOL HCL 50 MG PO TABS: 50.0000 mg | ORAL_TABLET | Freq: Every day | ORAL | 2 refills | Status: AC

## 2021-02-18 MED ORDER — LISINOPRIL-HYDROCHLOROTHIAZIDE 20-25 MG PO TABS: 1.0000 | ORAL_TABLET | Freq: Every day | ORAL | 1 refills | Status: DC

## 2021-02-18 MED ORDER — METFORMIN HCL 500 MG PO TABS: ORAL_TABLET | ORAL | 1 refills | Status: DC

## 2021-02-18 MED ORDER — CARBAMAZEPINE 200 MG PO TABS: ORAL_TABLET | ORAL | 2 refills | Status: DC

## 2021-02-18 MED ORDER — BUSPIRONE HCL 15 MG PO TABS: 15.0000 mg | ORAL_TABLET | Freq: Two times a day (BID) | ORAL | 2 refills | Status: DC

## 2021-02-18 MED ORDER — CARVEDILOL 6.25 MG PO TABS
6.2500 mg | ORAL_TABLET | Freq: Two times a day (BID) | ORAL | 1 refills | Status: DC
Start: 2021-02-18 — End: 2021-09-02

## 2021-02-18 MED ORDER — ZOLPIDEM TARTRATE 10 MG PO TABS: 10.0000 mg | ORAL_TABLET | Freq: Every evening | ORAL | 5 refills | Status: DC | PRN

## 2021-02-18 NOTE — Patient Instructions (Signed)
Acute Back Pain, Adult Acute back pain is sudden and usually short-lived. It is often caused by an injury to the muscles and tissues in the back. The injury may result from:  A muscle or ligament getting overstretched or torn (strained). Ligaments are tissues that connect bones to each other. Lifting something improperly can cause a back strain.  Wear and tear (degeneration) of the spinal disks. Spinal disks are circular tissue that provide cushioning between the bones of the spine (vertebrae).  Twisting motions, such as while playing sports or doing yard work.  A hit to the back.  Arthritis. You may have a physical exam, lab tests, and imaging tests to find the cause of your pain. Acute back pain usually goes away with rest and home care. Follow these instructions at home: Managing pain, stiffness, and swelling  Treatment may include medicines for pain and inflammation that are taken by mouth or applied to the skin, prescription pain medicine, or muscle relaxants. Take over-the-counter and prescription medicines only as told by your health care provider.  Your health care provider may recommend applying ice during the first 24-48 hours after your pain starts. To do this: ? Put ice in a plastic bag. ? Place a towel between your skin and the bag. ? Leave the ice on for 20 minutes, 2-3 times a day.  If directed, apply heat to the affected area as often as told by your health care provider. Use the heat source that your health care provider recommends, such as a moist heat pack or a heating pad. ? Place a towel between your skin and the heat source. ? Leave the heat on for 20-30 minutes. ? Remove the heat if your skin turns bright red. This is especially important if you are unable to feel pain, heat, or cold. You have a greater risk of getting burned. Activity  Do not stay in bed. Staying in bed for more than 1-2 days can delay your recovery.  Sit up and stand up straight. Avoid leaning  forward when you sit or hunching over when you stand. ? If you work at a desk, sit close to it so you do not need to lean over. Keep your chin tucked in. Keep your neck drawn back, and keep your elbows bent at a 90-degree angle (right angle). ? Sit high and close to the steering wheel when you drive. Add lower back (lumbar) support to your car seat, if needed.  Take short walks on even surfaces as soon as you are able. Try to increase the length of time you walk each day.  Do not sit, drive, or stand in one place for more than 30 minutes at a time. Sitting or standing for long periods of time can put stress on your back.  Do not drive or use heavy machinery while taking prescription pain medicine.  Use proper lifting techniques. When you bend and lift, use positions that put less stress on your back: ? Bend your knees. ? Keep the load close to your body. ? Avoid twisting.  Exercise regularly as told by your health care provider. Exercising helps your back heal faster and helps prevent back injuries by keeping muscles strong and flexible.  Work with a physical therapist to make a safe exercise program, as recommended by your health care provider. Do any exercises as told by your physical therapist.   Lifestyle  Maintain a healthy weight. Extra weight puts stress on your back and makes it difficult to have   good posture.  Avoid activities or situations that make you feel anxious or stressed. Stress and anxiety increase muscle tension and can make back pain worse. Learn ways to manage anxiety and stress, such as through exercise. General instructions  Sleep on a firm mattress in a comfortable position. Try lying on your side with your knees slightly bent. If you lie on your back, put a pillow under your knees.  Follow your treatment plan as told by your health care provider. This may include: ? Cognitive or behavioral therapy. ? Acupuncture or massage therapy. ? Meditation or yoga. Contact  a health care provider if:  You have pain that is not relieved with rest or medicine.  You have increasing pain going down into your legs or buttocks.  Your pain does not improve after 2 weeks.  You have pain at night.  You lose weight without trying.  You have a fever or chills. Get help right away if:  You develop new bowel or bladder control problems.  You have unusual weakness or numbness in your arms or legs.  You develop nausea or vomiting.  You develop abdominal pain.  You feel faint. Summary  Acute back pain is sudden and usually short-lived.  Use proper lifting techniques. When you bend and lift, use positions that put less stress on your back.  Take over-the-counter and prescription medicines and apply heat or ice as directed by your health care provider. This information is not intended to replace advice given to you by your health care provider. Make sure you discuss any questions you have with your health care provider. Document Revised: 08/20/2020 Document Reviewed: 08/20/2020 Elsevier Patient Education  2021 Elsevier Inc.  

## 2021-02-18 NOTE — Progress Notes (Signed)
Subjective:    Patient ID: Ellen Delgado, female    DOB: [birth year], 55 y.o.   MRN: [id number]   Chief Complaint: medical management of chronic issues     HPI:  1. Essential hypertension No c/o chest pain, sob or headache. Doe snot check blood pressure at home. BP Readings from Last 3 Encounters:  02/18/21 120/84  08/20/20 122/81  05/07/20 111/77     2. Pure hypercholesterolemia Doe sot watch diet and does little to no exercise. Lab Results  Component Value Date   CHOL 189 08/20/2020   HDL 67 08/20/2020   LDLCALC 96 08/20/2020   TRIG 153 (H) 08/20/2020   CHOLHDL 2.8 08/20/2020   The 10-year ASCVD risk score Mikey Bussing DC Jr., et al., 2013) is: 3.5%   3. Type 2 diabetes mellitus without complication, without long-term current use of insulin (HCC) Fasting blood sugars are running around 110. She doe snot check them everyday. Lab Results  Component Value Date   HGBA1C 6.3 08/20/2020     4. Gastroesophageal reflux disease without esophagitis She only takes meds as needed. Takes 1-2 x a month  5. Seizures ([place]) Is on tegratol ad is doing well. Has not had a seizure was 20 years ago.  6. Panic attacks Has buspar that she takes only as needed basis. usually takes 2 x a day.  7. Depression, major, single episode, complete remission ([place]) She is on celexa. Says she doe snot feel depressed at all. Depression screen [facility] 2/9 02/18/2021 08/20/2020 05/07/2020  Decreased Interest 0 1 0  Down, Depressed, Hopeless 0 0 0  PHQ - 2 Score 0 1 0  Altered sleeping 0 0 -  Tired, decreased energy 0 0 -  Change in appetite 0 1 -  Feeling bad or failure about yourself  0 0 -  Trouble concentrating 0 0 -  Moving slowly or fidgety/restless 0 0 -  Suicidal thoughts 0 0 -  PHQ-9 Score 0 2 -  Difficult doing work/chores Not difficult at all Not difficult at all -  Some recent data might be hidden     8. Primary insomnia Takes ambien to sleep. She sleeps 4-6 hours a night to  sleep.  9. DDD (degenerative disc disease), lumbar Has chronic back pain. Pain assessment: Cause of pain- DDD Pain location- lower back Pain on scale of 1-10- 8/10 Frequency- daily What increases pain-increase activity What makes pain Better-rest Effects on ADL - none Any change in general medical condition-none  Current opioids rx- ultram 66m BID # meds rx- 60 Effectiveness of current meds-helps Adverse reactions from pain meds-none Morphine equivalent- 5MME  Pill count performed-No Last drug screen - never ( high risk q333mmoderate risk q6m67mow risk yearly ) Urine drug screen today- Yes Was [place]- yes  If yes were their any concerning findings? - no   Overdose risk: 1   Pain contract signed on:02/18/21  10. BMI 26.0-26.9,adult Weight is unchanged Wt Readings from Last 3 Encounters:  02/18/21 208 lb (94.3 kg)  08/20/20 206 lb (93.4 kg)  05/07/20 204 lb (92.5 kg)   BMI Readings from Last 3 Encounters:  02/18/21 38.04 kg/m  08/20/20 37.68 kg/m  05/07/20 37.31 kg/m       Outpatient Encounter Medications as of 02/18/2021  Medication Sig  . aspirin EC 81 MG tablet Take 1 tablet (81 mg total) by mouth daily. (Patient taking differently: Take 81 mg by mouth 4 (four) times a week. )  . blood  glucose meter kit and supplies KIT Checks blood sugars at home fating in morning and PRN  . busPIRone (BUSPAR) 15 MG tablet TAKE ONE TABLET TWICE A DAY.  . calcium-vitamin D (OSCAL WITH D) 500-200 MG-UNIT tablet Take 3 tablets by mouth daily.  . carbamazepine (TEGRETOL) 200 MG tablet TAKE  (1)  TABLET  FOUR TIMES DAILY.  . carvedilol (COREG) 6.25 MG tablet Take 1 tablet (6.25 mg total) by mouth 2 (two) times daily with a meal.  . citalopram (CELEXA) 20 MG tablet Take 1 tablet (20 mg total) by mouth daily.  . cyclobenzaprine (FLEXERIL) 10 MG tablet TAKE (1) TABLET THREE TIMES DAILY.  Marland Kitchen lisinopril-hydrochlorothiazide (ZESTORETIC) 20-25 MG tablet Take 1 tablet by  mouth daily.  . metFORMIN (GLUCOPHAGE) 500 MG tablet TAKE 1 TABLET TWICE DAILY WITH A MEAL.  . Multiple Vitamin (MULTIVITAMIN) tablet Take 1 tablet by mouth daily.  . simvastatin (ZOCOR) 40 MG tablet Take 1 tablet (40 mg total) by mouth daily at 6 PM.  . zolpidem (AMBIEN) 10 MG tablet Take 1 tablet (10 mg total) by mouth at bedtime as needed for sleep.   No facility-administered encounter medications on file as of 02/18/2021.    Past Surgical History:  Procedure Laterality Date  . CHOLECYSTECTOMY      Family History  Problem Relation Age of Onset  . Diabetes Mother   . Diabetes Father   . Coronary artery disease Father   . Cancer Paternal Grandmother     New complaints: None today  Social history: Lives with her boyfriend  Controlled substance contract: 05/07/20    Review of Systems  Constitutional: Negative for diaphoresis.  Eyes: Negative for pain.  Respiratory: Negative for shortness of breath.   Cardiovascular: Negative for chest pain, palpitations and leg swelling.  Gastrointestinal: Negative for abdominal pain.  Endocrine: Negative for polydipsia.  Skin: Negative for rash.  Neurological: Negative for dizziness, weakness and headaches.  Hematological: Does not bruise/bleed easily.  All other systems reviewed and are negative.      Objective:   Physical Exam Vitals and nursing note reviewed.  Constitutional:      General: She is not in acute distress.    Appearance: Normal appearance. She is well-developed.  HENT:     Head: Normocephalic.     Nose: Nose normal.  Eyes:     Pupils: Pupils are equal, round, and reactive to light.  Neck:     Vascular: No carotid bruit or JVD.  Cardiovascular:     Rate and Rhythm: Normal rate and regular rhythm.     Heart sounds: Normal heart sounds.  Pulmonary:     Effort: Pulmonary effort is normal. No respiratory distress.     Breath sounds: Normal breath sounds. No wheezing or rales.  Chest:     Chest wall: No  tenderness.  Abdominal:     General: Bowel sounds are normal. There is no distension or abdominal bruit.     Palpations: Abdomen is soft. There is no hepatomegaly, splenomegaly, mass or pulsatile mass.     Tenderness: There is no abdominal tenderness.  Musculoskeletal:        General: Normal range of motion.     Cervical back: Normal range of motion and neck supple.     Comments: Rises slowly from sitting to standing Pain with leaning forward (-) SLR bil Motor strength and sensation distally intact  Lymphadenopathy:     Cervical: No cervical adenopathy.  Skin:    General: Skin is warm  and dry.  Neurological:     Mental Status: She is alert and oriented to person, place, and time.     Deep Tendon Reflexes: Reflexes are normal and symmetric.  Psychiatric:        Behavior: Behavior normal.        Thought Content: Thought content normal.        Judgment: Judgment normal.     BP 120/84   Pulse 80   Temp (!) 97.2 F (36.2 C) (Temporal)   Resp 20   Ht '5\' 2"'  (1.575 m)   Wt 208 lb (94.3 kg)   SpO2 99%   BMI 38.04 kg/m        Assessment & Plan:  CHELAN HERINGER comes in today with chief complaint of Medical Management of Chronic Issues   Diagnosis and orders addressed:  1. Essential hypertension Low sodium diet - CBC with Differential/Platelet - CMP14+EGFR - carvedilol (COREG) 6.25 MG tablet; Take 1 tablet (6.25 mg total) by mouth 2 (two) times daily with a meal.  Dispense: 180 tablet; Refill: 1 - lisinopril-hydrochlorothiazide (ZESTORETIC) 20-25 MG tablet; Take 1 tablet by mouth daily.  Dispense: 90 tablet; Refill: 1  2. Pure hypercholesterolemia Low fat diet - Lipid panel - simvastatin (ZOCOR) 40 MG tablet; Take 1 tablet (40 mg total) by mouth daily at 6 PM.  Dispense: 90 tablet; Refill: 1  3. Type 2 diabetes mellitus without complication, without long-term current use of insulin (HCC) Continue to watch carbs in diet - Bayer DCA Hb A1c Waived - Microalbumin /  creatinine urine ratio - metFORMIN (GLUCOPHAGE) 500 MG tablet; TAKE 1 TABLET TWICE DAILY WITH A MEAL.  Dispense: 180 tablet; Refill: 1  4. Gastroesophageal reflux disease without esophagitis Avoid spicy foods Do not eat 2 hours prior to bedtime  5. Seizures (HCC) Continue tegratol - carbamazepine (TEGRETOL) 200 MG tablet; TAKE  (1)  TABLET  FOUR TIMES DAILY.  Dispense: 120 tablet; Refill: 2  6. Panic attacks Stress management - busPIRone (BUSPAR) 15 MG tablet; Take 1 tablet (15 mg total) by mouth 2 (two) times daily.  Dispense: 60 tablet; Refill: 2  7. Depression, major, single episode, complete remission (HCC) - citalopram (CELEXA) 20 MG tablet; Take 1 tablet (20 mg total) by mouth daily.  Dispense: 90 tablet; Refill: 1  8. Primary insomnia Bedtime routine - zolpidem (AMBIEN) 10 MG tablet; Take 1 tablet (10 mg total) by mouth at bedtime as needed for sleep.  Dispense: 30 tablet; Refill: 5  9. DDD (degenerative disc disease), lumbar Moist heat Rest Do not take ultram at night with ambien - traMADol (ULTRAM) 50 MG tablet; Take 1 tablet (50 mg total) by mouth daily.  Dispense: 30 tablet; Refill: 2 - ToxASSURE Select 13 (MW), Urine  10. BMI 26.0-26.9,adult Discussed diet and exercise for person with BMI >25 Will recheck weight in 3-6 months   Labs pending Health Maintenance reviewed- colorguard ordered- patient will schedule mammogram Diet and exercise encouraged  Follow up plan: [place], FNP

## 2021-02-19 LAB — CMP14+EGFR
ALT: 17 IU/L (ref 0–32)
AST: 20 IU/L (ref 0–40)
Albumin/Globulin Ratio: 1.2 (ref 1.2–2.2)
Albumin: 3.9 g/dL (ref 3.8–4.9)
Alkaline Phosphatase: 86 IU/L (ref 44–121)
BUN/Creatinine Ratio: 19 (ref 9–23)
BUN: 16 mg/dL (ref 6–24)
Bilirubin Total: <0.2 mg/dL (ref 0.0–1.2)
CO2: 27 mmol/L (ref 20–29)
Calcium: 10.8 mg/dL — ABNORMAL HIGH (ref 8.7–10.2)
Chloride: 98 mmol/L (ref 96–106)
Creatinine, Ser: 0.83 mg/dL (ref 0.57–1.00)
Globulin, Total: 3.2 g/dL (ref 1.5–4.5)
Glucose: 168 mg/dL — ABNORMAL HIGH (ref 65–99)
Potassium: 4.9 mmol/L (ref 3.5–5.2)
Sodium: 139 mmol/L (ref 134–144)
Total Protein: 7.1 g/dL (ref 6.0–8.5)
eGFR: 83 mL/min/{1.73_m2} (ref >59)

## 2021-02-19 LAB — MICROALBUMIN / CREATININE URINE RATIO
Creatinine, Urine: 52.5 mg/dL
Microalb/Creat Ratio: <6 mg/g creat (ref 0–29)
Microalbumin, Urine: <3 ug/mL

## 2021-02-19 LAB — CBC WITH DIFFERENTIAL/PLATELET
Basophils Absolute: 0.1 10*3/uL (ref 0.0–0.2)
Basos: 1 %
EOS (ABSOLUTE): 0.3 10*3/uL (ref 0.0–0.4)
Eos: 4 %
Hematocrit: 39.8 % (ref 34.0–46.6)
Hemoglobin: 12.7 g/dL (ref 11.1–15.9)
Immature Grans (Abs): 0 10*3/uL (ref 0.0–0.1)
Immature Granulocytes: 0 %
Lymphocytes Absolute: 1.9 10*3/uL (ref 0.7–3.1)
Lymphs: 29 %
MCH: 30 pg (ref 26.6–33.0)
MCHC: 31.9 g/dL (ref 31.5–35.7)
MCV: 94 fL (ref 79–97)
Monocytes Absolute: 0.6 10*3/uL (ref 0.1–0.9)
Monocytes: 9 %
Neutrophils Absolute: 3.8 10*3/uL (ref 1.4–7.0)
Neutrophils: 57 %
Platelets: 275 10*3/uL (ref 150–450)
RBC: 4.23 x10E6/uL (ref 3.77–5.28)
RDW: 11.9 % (ref 11.7–15.4)
WBC: 6.6 10*3/uL (ref 3.4–10.8)

## 2021-02-19 LAB — LIPID PANEL
Chol/HDL Ratio: 2.9 ratio (ref 0.0–4.4)
Cholesterol, Total: 173 mg/dL (ref 100–199)
HDL: 60 mg/dL (ref >39)
LDL Chol Calc (NIH): 83 mg/dL (ref 0–99)
Triglycerides: 175 mg/dL — ABNORMAL HIGH (ref 0–149)
VLDL Cholesterol Cal: 30 mg/dL (ref 5–40)

## 2021-02-25 LAB — TOXASSURE SELECT 13 (MW), URINE

## 2021-05-11 ENCOUNTER — Other Ambulatory Visit: Payer: Self-pay | Admitting: Nurse Practitioner

## 2021-05-11 DIAGNOSIS — M5136 Other intervertebral disc degeneration, lumbar region: Secondary | ICD-10-CM

## 2021-05-27 DIAGNOSIS — Z23 Encounter for immunization: Secondary | ICD-10-CM | POA: Diagnosis not present

## 2021-06-10 ENCOUNTER — Other Ambulatory Visit: Payer: Self-pay | Admitting: Nurse Practitioner

## 2021-06-10 DIAGNOSIS — F410 Panic disorder [episodic paroxysmal anxiety] without agoraphobia: Secondary | ICD-10-CM

## 2021-06-10 DIAGNOSIS — R569 Unspecified convulsions: Secondary | ICD-10-CM

## 2021-06-10 NOTE — Telephone Encounter (Signed)
Last office visit 02/18/21 Upcoming appointment 08/26/21 Last refill Buspar 02/18/21, #60, 2 refills Last refill Tegretol 02/18/21, #120, 2 refills

## 2021-07-22 ENCOUNTER — Ambulatory Visit: Payer: Medicaid Other | Admitting: Nurse Practitioner

## 2021-07-22 ENCOUNTER — Other Ambulatory Visit: Payer: Self-pay

## 2021-07-22 ENCOUNTER — Encounter: Payer: Self-pay | Admitting: Nurse Practitioner

## 2021-07-22 VITALS — BP 119/73 | HR 93 | Temp 97.4°F | Resp 20 | Ht 62.0 in | Wt 209.0 lb

## 2021-07-22 DIAGNOSIS — B001 Herpesviral vesicular dermatitis: Secondary | ICD-10-CM

## 2021-07-22 DIAGNOSIS — B372 Candidiasis of skin and nail: Secondary | ICD-10-CM | POA: Diagnosis not present

## 2021-07-22 MED ORDER — NYSTATIN 100000 UNIT/GM EX CREA: 1.0000 "application " | TOPICAL_CREAM | Freq: Two times a day (BID) | CUTANEOUS | 2 refills | Status: DC

## 2021-07-22 MED ORDER — VALACYCLOVIR HCL 1 G PO TABS
ORAL_TABLET | ORAL | 1 refills | Status: DC
Start: 2021-07-22 — End: 2023-03-20

## 2021-07-22 NOTE — Progress Notes (Signed)
   Subjective:    Patient ID: Ellen Delgado, female    DOB: [birth year], 55 y.o.   MRN: [id number]   Chief Complaint: Place on right breast (Also wants rx for fever blisters/)   HPI Patient comes in today stating that she found a "odd place" on her right  breast. Area has been there for about 3 months. Started out sie of quarter and has gotten a little larger. Area burns and itches. No drainage.  Has frequent fever blisters- would like prescription meds to treat.   Review of Systems  Constitutional: Negative.   Respiratory: Negative.    Cardiovascular: Negative.   Gastrointestinal: Negative.   Genitourinary: Negative.   Neurological: Negative.   Psychiatric/Behavioral: Negative.    All other systems reviewed and are negative.     Objective:   Physical Exam Vitals and nursing note reviewed.  Constitutional:      Appearance: Normal appearance.  Cardiovascular:     Pulses: Normal pulses.     Heart sounds: Normal heart sounds.  Pulmonary:     Effort: Pulmonary effort is normal.     Breath sounds: Normal breath sounds.  Skin:    Comments: Lime size erythematous macular area on under surface of right breast. No nipple discharge and no palpable nodules in either breast.  Neurological:     General: No focal deficit present.     Mental Status: She is alert and oriented to person, place, and time.  Psychiatric:        Mood and Affect: Mood normal.        Behavior: Behavior normal.    BP 119/73   Pulse 93   Temp (!) 97.4 F (36.3 C) (Temporal)   Resp 20   Ht 5\' 2"  (1.575 m)   Wt 209 lb (94.8 kg)   SpO2 100%   BMI 38.23 kg/m        Assessment & Plan:   in today with chief complaint of Place on right breast (Also wants rx for fever blisters/)   1. Cutaneous candidiasis Keep area really dry Avoid scratching or rubbing area Need to schedule mammogram - nystatin cream (MYCOSTATIN); Apply 1 application topically 2 (two) times daily.  Dispense: 30  g; Refill: 2  2. Fever blister Only take 4 tablets in 24 hours. - valACYclovir (VALTREX) 1000 MG tablet; 2 tablets bid at fever blister onset.  Dispense: 12 tablet; Refill: 1    The above assessment and management plan was discussed with the patient. The patient verbalized understanding of and has agreed to the management plan. Patient is aware to call the clinic if symptoms persist or worsen. Patient is aware when to return to the clinic for a follow-up visit. Patient educated on when it is appropriate to go to the emergency department.   Mary-Margaret [name], FNP

## 2021-07-22 NOTE — Patient Instructions (Signed)
Cold Sore A cold sore, also called a fever blister, is a small, fluid-filled sore that forms inside of the mouth or on the lips, gums, nose, chin, or cheeks. Cold sores can spread to other parts of the body, such as the eyes or fingers. Cold sores can spread from person to person (are contagious) until the sores crust over completely. Most cold sores go away within 2 weeks. What are the causes? Cold sores are caused by a virus (herpes simplex virus type 1, HSV-1). The virus can spread from person to person through close contact, such as through: Kissing. Touching the affected area. Sharing personal items such as lip balm, razors, a drinking glass, or eating utensils. What increases the risk? You are more likely to develop this condition if you: Are tired, stressed, or sick. Are having your period (menstruating). Are pregnant. Take certain medicines. Are out in cold weather or get too much sun. What are the signs or symptoms? Symptoms of a cold sore outbreak go through different stages. These are the stages of a cold sore: Tingling, itching, or burning is felt 1-2 days before the outbreak. Fluid-filled blisters appear on the lips, inside the mouth, on the nose, or on the cheeks. The blisters start to ooze clear fluid. The blisters dry up, and a yellow crust appears in their place. The crust falls off. In some cases, other symptoms can develop during a cold sore outbreak. These can include: Fever. Sore throat. Headache. Muscle aches. Swollen neck glands. How is this treated? There is no cure for cold sores or the virus that causes them. There is also no vaccine to prevent the virus. Most cold sores go away on their own without treatment within 2 weeks. Your doctor may prescribe medicines to: Help with pain. Keep the virus from growing. Help you heal faster. Medicines may be in the form of creams, gels, pills, or a shot. Follow these instructions at home: Medicines Take or apply  over-the-counter and prescription medicines only as told by your doctor. Use a cotton-tip swab to apply creams or gels to your sores. Ask your doctor if you can take lysine supplements. These may help with healing. Sore care  Do not touch the sores or pick the scabs. Wash your hands often. Do not touch your eyes without washing your hands first. Keep the sores clean and dry. If told, put ice on the sores: Put ice in a plastic bag. Place a towel between your skin and the bag. Leave the ice on for 20 minutes, 2-3 times a day. Eating and drinking Eat a soft, bland diet. Avoid eating hot, cold, or salty foods. These can hurt your mouth. Use a straw if it hurts to drink out of a glass. Eat foods that have a lot of lysine in them. These include meat, fish, and dairy products. Avoid sugary foods, chocolates, nuts, and grains. These foods have a high amount of a substance (arginine) that can cause the virus to grow. Lifestyle Do not kiss, have oral sex, or share personal items until your sores heal. Stress, poor sleep, and being out in the sun can trigger outbreaks. Make sure you: Do activities that help you relax, such as deep breathing exercises or meditation. Get enough sleep. Apply sunscreen on your lips before you go out in the sun. Contact a doctor if: You have symptoms for more than 2 weeks. You have pus coming from the sores. You have redness that is spreading. You have pain or irritation in   your eye. You get sores on your genitals. Your sores do not heal within 2 weeks. You get cold sores often. Get help right away if: You have a fever and your symptoms suddenly get worse. You have a headache and confusion. You have tiredness (fatigue). You do not want to eat as much as normal (loss of appetite). You have a stiff neck or are sensitive to light. Summary A cold sore is a small, fluid-filled sore that forms inside of the mouth or on the lips, gums, nose, chin, or cheeks. Cold  sores can spread from person to person (are contagious) until the sores crust over completely. Most cold sores go away within 2 weeks. Wash your hands often. Do not touch your eyes without washing your hands first. Do not kiss, have oral sex, or share personal items until your sores heal. Contact a doctor if your sores do not heal within 2 weeks. This information is not intended to replace advice given to you by your health care provider. Make sure you discuss any questions you have with your health care provider. Document Revised: 03/19/2019 Document Reviewed: 04/29/2018 Elsevier Patient Education  2022 Elsevier Inc.  

## 2021-07-31 DIAGNOSIS — H5213 Myopia, bilateral: Secondary | ICD-10-CM | POA: Diagnosis not present

## 2021-08-02 ENCOUNTER — Other Ambulatory Visit: Payer: Self-pay | Admitting: Nurse Practitioner

## 2021-08-02 DIAGNOSIS — Z1231 Encounter for screening mammogram for malignant neoplasm of breast: Secondary | ICD-10-CM

## 2021-08-09 ENCOUNTER — Telehealth: Payer: Self-pay | Admitting: Nurse Practitioner

## 2021-08-09 ENCOUNTER — Other Ambulatory Visit: Payer: Self-pay | Admitting: Nurse Practitioner

## 2021-08-09 DIAGNOSIS — M5136 Other intervertebral disc degeneration, lumbar region: Secondary | ICD-10-CM

## 2021-08-09 DIAGNOSIS — F410 Panic disorder [episodic paroxysmal anxiety] without agoraphobia: Secondary | ICD-10-CM

## 2021-08-09 DIAGNOSIS — R569 Unspecified convulsions: Secondary | ICD-10-CM

## 2021-08-09 NOTE — Telephone Encounter (Signed)
Patient notified and verbalized understanding. 

## 2021-08-09 NOTE — Telephone Encounter (Signed)
Try some gold bond powder

## 2021-08-09 NOTE — Telephone Encounter (Signed)
Pt was seen 8/12 by MMM for a place on her breast. She was given a cream and told it was a yeast infection. The place has not gotten better and the pt thinks that it is spreading. Please call back and advise.

## 2021-08-26 ENCOUNTER — Ambulatory Visit: Payer: Self-pay | Admitting: Nurse Practitioner

## 2021-09-02 ENCOUNTER — Ambulatory Visit: Payer: Medicaid Other | Admitting: Nurse Practitioner

## 2021-09-02 ENCOUNTER — Encounter: Payer: Self-pay | Admitting: Nurse Practitioner

## 2021-09-02 ENCOUNTER — Other Ambulatory Visit: Payer: Self-pay

## 2021-09-02 VITALS — BP 135/84 | HR 85 | Temp 95.5°F | Ht 62.0 in | Wt 211.6 lb

## 2021-09-02 DIAGNOSIS — R569 Unspecified convulsions: Secondary | ICD-10-CM | POA: Diagnosis not present

## 2021-09-02 DIAGNOSIS — F5101 Primary insomnia: Secondary | ICD-10-CM | POA: Diagnosis not present

## 2021-09-02 DIAGNOSIS — K219 Gastro-esophageal reflux disease without esophagitis: Secondary | ICD-10-CM

## 2021-09-02 DIAGNOSIS — F325 Major depressive disorder, single episode, in full remission: Secondary | ICD-10-CM

## 2021-09-02 DIAGNOSIS — F410 Panic disorder [episodic paroxysmal anxiety] without agoraphobia: Secondary | ICD-10-CM | POA: Diagnosis not present

## 2021-09-02 DIAGNOSIS — E7800 Pure hypercholesterolemia, unspecified: Secondary | ICD-10-CM

## 2021-09-02 DIAGNOSIS — Z23 Encounter for immunization: Secondary | ICD-10-CM | POA: Diagnosis not present

## 2021-09-02 DIAGNOSIS — I10 Essential (primary) hypertension: Secondary | ICD-10-CM

## 2021-09-02 DIAGNOSIS — M5136 Other intervertebral disc degeneration, lumbar region: Secondary | ICD-10-CM

## 2021-09-02 DIAGNOSIS — E119 Type 2 diabetes mellitus without complications: Secondary | ICD-10-CM | POA: Diagnosis not present

## 2021-09-02 DIAGNOSIS — M51369 Other intervertebral disc degeneration, lumbar region without mention of lumbar back pain or lower extremity pain: Secondary | ICD-10-CM

## 2021-09-02 LAB — CMP14+EGFR
ALT: 19 IU/L (ref 0–32)
AST: 20 IU/L (ref 0–40)
Albumin/Globulin Ratio: 1.7 (ref 1.2–2.2)
Albumin: 4.4 g/dL (ref 3.8–4.9)
Alkaline Phosphatase: 89 IU/L (ref 44–121)
BUN/Creatinine Ratio: 18 (ref 9–23)
BUN: 16 mg/dL (ref 6–24)
Bilirubin Total: <0.2 mg/dL (ref 0.0–1.2)
CO2: 25 mmol/L (ref 20–29)
Calcium: 9.9 mg/dL (ref 8.7–10.2)
Chloride: 99 mmol/L (ref 96–106)
Creatinine, Ser: 0.9 mg/dL (ref 0.57–1.00)
Globulin, Total: 2.6 g/dL (ref 1.5–4.5)
Glucose: 151 mg/dL — ABNORMAL HIGH (ref 65–99)
Potassium: 4.6 mmol/L (ref 3.5–5.2)
Sodium: 139 mmol/L (ref 134–144)
Total Protein: 7 g/dL (ref 6.0–8.5)
eGFR: 75 mL/min/{1.73_m2} (ref >59)

## 2021-09-02 LAB — CBC WITH DIFFERENTIAL/PLATELET
Basophils Absolute: 0.1 10*3/uL (ref 0.0–0.2)
Basos: 1 %
EOS (ABSOLUTE): 0.3 10*3/uL (ref 0.0–0.4)
Eos: 4 %
Hematocrit: 37.8 % (ref 34.0–46.6)
Hemoglobin: 12.6 g/dL (ref 11.1–15.9)
Immature Grans (Abs): 0 10*3/uL (ref 0.0–0.1)
Immature Granulocytes: 0 %
Lymphocytes Absolute: 2.1 10*3/uL (ref 0.7–3.1)
Lymphs: 28 %
MCH: 31.3 pg (ref 26.6–33.0)
MCHC: 33.3 g/dL (ref 31.5–35.7)
MCV: 94 fL (ref 79–97)
Monocytes Absolute: 0.7 10*3/uL (ref 0.1–0.9)
Monocytes: 10 %
Neutrophils Absolute: 4.2 10*3/uL (ref 1.4–7.0)
Neutrophils: 57 %
Platelets: 301 10*3/uL (ref 150–450)
RBC: 4.03 x10E6/uL (ref 3.77–5.28)
RDW: 11.9 % (ref 11.7–15.4)
WBC: 7.5 10*3/uL (ref 3.4–10.8)

## 2021-09-02 LAB — BAYER DCA HB A1C WAIVED: HB A1C (BAYER DCA - WAIVED): 6.7 % — ABNORMAL HIGH (ref 4.8–5.6)

## 2021-09-02 LAB — LIPID PANEL
Chol/HDL Ratio: 3.1 ratio (ref 0.0–4.4)
Cholesterol, Total: 180 mg/dL (ref 100–199)
HDL: 59 mg/dL (ref >39)
LDL Chol Calc (NIH): 88 mg/dL (ref 0–99)
Triglycerides: 198 mg/dL — ABNORMAL HIGH (ref 0–149)
VLDL Cholesterol Cal: 33 mg/dL (ref 5–40)

## 2021-09-02 MED ORDER — ZOLPIDEM TARTRATE 10 MG PO TABS: 10.0000 mg | ORAL_TABLET | Freq: Every evening | ORAL | 5 refills | Status: DC | PRN

## 2021-09-02 MED ORDER — LISINOPRIL-HYDROCHLOROTHIAZIDE 20-25 MG PO TABS: 1.0000 | ORAL_TABLET | Freq: Every day | ORAL | 1 refills | Status: DC

## 2021-09-02 MED ORDER — CITALOPRAM HYDROBROMIDE 20 MG PO TABS: 20.0000 mg | ORAL_TABLET | Freq: Every day | ORAL | 1 refills | Status: DC

## 2021-09-02 MED ORDER — BUSPIRONE HCL 15 MG PO TABS: 15.0000 mg | ORAL_TABLET | Freq: Two times a day (BID) | ORAL | 5 refills | Status: DC

## 2021-09-02 MED ORDER — CARBAMAZEPINE 200 MG PO TABS: ORAL_TABLET | ORAL | 0 refills | Status: DC

## 2021-09-02 MED ORDER — TRAMADOL HCL 50 MG PO TABS: 50.0000 mg | ORAL_TABLET | Freq: Three times a day (TID) | ORAL | 0 refills | Status: AC | PRN

## 2021-09-02 MED ORDER — SIMVASTATIN 40 MG PO TABS: 40.0000 mg | ORAL_TABLET | Freq: Every day | ORAL | 1 refills | Status: DC

## 2021-09-02 MED ORDER — CARVEDILOL 6.25 MG PO TABS: 6.2500 mg | ORAL_TABLET | Freq: Two times a day (BID) | ORAL | 1 refills | Status: DC

## 2021-09-02 MED ORDER — METFORMIN HCL 500 MG PO TABS: ORAL_TABLET | ORAL | 1 refills | Status: DC

## 2021-09-02 NOTE — Progress Notes (Signed)
Subjective:    Patient ID: Ellen Delgado, female    DOB: 01/10/1966, 55 y.o.   MRN: [id number]  Chief Complaint: medical management of chronic issues     HPI:  1. Essential hypertension No c/o chest pain, sob or headache. Does not check blood pressure at home. BP Readings from Last 3 Encounters:  07/22/21 119/73  02/18/21 120/84  08/20/20 122/81     2. Pure hypercholesterolemia Doe snot watch diet and doe sno dedicated exercise. Lab Results  Component Value Date   CHOL 173 02/18/2021   HDL 60 02/18/2021   LDLCALC 83 02/18/2021   TRIG 175 (H) 02/18/2021   CHOLHDL 2.9 02/18/2021     3. Type 2 diabetes mellitus without complication, without long-term current use of insulin (HCC) Does not check blood sugars on a regular basis. She does not really watch carbs in diet. Lab Results  Component Value Date   HGBA1C 6.7 02/18/2021     4. Gastroesophageal reflux disease without esophagitis Is on no prescription meds ofr this. Has occasional symptoms.  5. Seizures ([place]) Has been on tegratol for years. Has had no seizure activity in a long time.  6. Primary insomnia Is on ambien  to sleep at night. Sleeps about 8 hours most nights.  7. Panic attacks Is on buspar for anxiety and takes daily.  GAD 7 : Generalized Anxiety Score 09/02/2021 02/18/2021 05/07/2020 09/17/2018  Nervous, Anxious, on Edge 0 0 2 1  Control/stop worrying 0 _0 Worry too much - different things 0 _1 Trouble relaxing 0 _2 Restless 0 0 2 1  Easily annoyed or irritable 0 0 0 2  Afraid - awful might happen 0 0 0 2  Total GAD 7 Score 0 _3 Anxiety Difficulty Not difficult at all Not difficult at all Somewhat difficult Not difficult at all      8. Depression, major, single episode, complete remission ([place]) Takes celexa daily and is working well for her. Depression screen [facility] 2/9 09/02/2021 07/22/2021 02/18/2021  Decreased Interest 0 0 0  Down, Depressed, Hopeless 0 0 0  PHQ - 2 Score 0  0 0  Altered sleeping 0 - 0  Tired, decreased energy 0 - 0  Change in appetite 0 - 0  Feeling bad or failure about yourself  0 - 0  Trouble concentrating 0 - 0  Moving slowly or fidgety/restless 0 - 0  Suicidal thoughts 0 - 0  PHQ-9 Score 0 - 0  Difficult doing work/chores Not difficult at all - Not difficult at all  Some recent data might be hidden     9. DDD (degenerative disc disease), lumbar Has chronic back pain. Motrin or tylenol OTC helps some. Rate [place] 5/10 today. She takes tramadol on occasion. 30 tablets last her 6 months  10. BMI 38.0-38.9,adult No recent weight changes Wt Readings from Last 3 Encounters:  09/02/21 211 lb 9.6 oz (96 kg)  07/22/21 209 lb (94.8 kg)  02/18/21 208 lb (94.3 kg)   BMI Readings from Last 3 Encounters:  09/02/21 38.70 kg/m  07/22/21 38.23 kg/m  02/18/21 38.04 kg/m       Outpatient Encounter Medications as of 09/02/2021  Medication Sig   aspirin EC 81 MG tablet Take 1 tablet (81 mg total) by mouth daily. (Patient taking differently: Take 81 mg by mouth 4 (four) times a week.)   blood glucose meter kit and supplies KIT Checks blood sugars  at home fating in morning and PRN   busPIRone (BUSPAR) 15 MG tablet TAKE ONE TABLET TWICE A DAY.   calcium-vitamin D (OSCAL WITH D) 500-200 MG-UNIT tablet Take 3 tablets by mouth daily.   carbamazepine (TEGRETOL) 200 MG tablet TAKE  (1)  TABLET  FOUR TIMES DAILY.   carvedilol (COREG) 6.25 MG tablet Take 1 tablet (6.25 mg total) by mouth 2 (two) times daily with a meal.   citalopram (CELEXA) 20 MG tablet Take 1 tablet (20 mg total) by mouth daily.   cyclobenzaprine (FLEXERIL) 10 MG tablet TAKE (1) TABLET THREE TIMES DAILY.   lisinopril-hydrochlorothiazide (ZESTORETIC) 20-25 MG tablet Take 1 tablet by mouth daily.   metFORMIN (GLUCOPHAGE) 500 MG tablet TAKE 1 TABLET TWICE DAILY WITH A MEAL.   Multiple Vitamin (MULTIVITAMIN) tablet Take 1 tablet by mouth daily.   nystatin cream (MYCOSTATIN) Apply 1  application topically 2 (two) times daily.   simvastatin (ZOCOR) 40 MG tablet Take 1 tablet (40 mg total) by mouth daily at 6 PM.   valACYclovir (VALTREX) 1000 MG tablet 2 tablets bid at fever blister onset.   zolpidem (AMBIEN) 10 MG tablet Take 1 tablet (10 mg total) by mouth at bedtime as needed for sleep.   No facility-administered encounter medications on file as of 09/02/2021.    Past Surgical History:  Procedure Laterality Date   CHOLECYSTECTOMY      Family History  Problem Relation Age of Onset   Diabetes Mother    Diabetes Father    Coronary artery disease Father    Cancer Paternal Grandmother     New complaints: None   Social history: Lives with her boyfriend  Controlled substance contract: 05/07/20-mnew one signed today      Review of Systems  Constitutional:  Negative for diaphoresis.  Eyes:  Negative for pain.  Respiratory:  Negative for shortness of breath.   Cardiovascular:  Negative for chest pain, palpitations and leg swelling.  Gastrointestinal:  Negative for abdominal pain.  Endocrine: Negative for polydipsia.  Skin:  Negative for rash.  Neurological:  Negative for dizziness, weakness and headaches.  Hematological:  Does not bruise/bleed easily.  All other systems reviewed and are negative.     Objective:   Physical Exam Vitals and nursing note reviewed.  Constitutional:      General: She is not in acute distress.    Appearance: Normal appearance. She is well-developed.  HENT:     Head: Normocephalic.     Right Ear: Tympanic membrane normal.     Left Ear: Tympanic membrane normal.     Nose: Nose normal.     Mouth/Throat:     Mouth: Mucous membranes are moist.  Eyes:     Pupils: Pupils are equal, round, and reactive to light.  Neck:     Vascular: No carotid bruit or JVD.  Cardiovascular:     Rate and Rhythm: Normal rate and regular rhythm.     Heart sounds: Normal heart sounds.  Pulmonary:     Effort: Pulmonary effort is normal. No  respiratory distress.     Breath sounds: Normal breath sounds. No wheezing or rales.  Chest:     Chest wall: No tenderness.  Abdominal:     General: Bowel sounds are normal. There is no distension or abdominal bruit.     Palpations: Abdomen is soft. There is no hepatomegaly, splenomegaly, mass or pulsatile mass.     Tenderness: There is no abdominal tenderness.  Musculoskeletal:  General: Normal range of motion.     Cervical back: Normal range of motion and neck supple.  Lymphadenopathy:     Cervical: No cervical adenopathy.  Skin:    General: Skin is warm and dry.  Neurological:     Mental Status: She is alert and oriented to person, place, and time.     Deep Tendon Reflexes: Reflexes are normal and symmetric.  Psychiatric:        Behavior: Behavior normal.        Thought Content: Thought content normal.        Judgment: Judgment normal.   BP 135/84   Pulse 85   Temp (!) 95.5 F (35.3 C) (Temporal)   Ht _0  (1.575 m)   Wt 211 lb 9.6 oz (96 kg)   SpO2 96%   BMI 38.70 kg/m    Hgba1c 6.7%     Assessment & Plan:   Ellen Delgado comes in today with chief complaint of Medical Management of Chronic Issues   Diagnosis and orders addressed:  1. Essential hypertension Low sodium diet - CBC with Differential/Platelet - CMP14+EGFR - carvedilol (COREG) 6.25 MG tablet; Take 1 tablet (6.25 mg total) by mouth 2 (two) times daily with a meal.  Dispense: 180 tablet; Refill: 1 - lisinopril-hydrochlorothiazide (ZESTORETIC) 20-25 MG tablet; Take 1 tablet by mouth daily.  Dispense: 90 tablet; Refill: 1  2. Pure hypercholesterolemia Low fat diet - Lipid panel - simvastatin (ZOCOR) 40 MG tablet; Take 1 tablet (40 mg total) by mouth daily at 6 PM.  Dispense: 90 tablet; Refill: 1  3. Type 2 diabetes mellitus without complication, without long-term current use of insulin (HCC) Watch carbs in diet - Bayer DCA Hb A1c Waived - metFORMIN (GLUCOPHAGE) 500 MG tablet; TAKE 1  TABLET TWICE DAILY WITH A MEAL.  Dispense: 180 tablet; Refill: 1  4. Gastroesophageal reflux disease without esophagitis Avoid spicy foods Do not eat 2 hours prior to bedtime   5. Seizures ([place]) Report any seizure activity - carbamazepine (TEGRETOL) 200 MG tablet; TAKE  (1)  TABLET  FOUR TIMES DAILY.  Dispense: 120 tablet; Refill: 0  6. Primary insomnia Bedtime routine - zolpidem (AMBIEN) 10 MG tablet; Take 1 tablet (10 mg total) by mouth at bedtime as needed for sleep.  Dispense: 30 tablet; Refill: 5  7. Panic attacks Stress management - busPIRone (BUSPAR) 15 MG tablet; Take 1 tablet (15 mg total) by mouth 2 (two) times daily.  Dispense: 60 tablet; Refill: 5  8. Depression, major, single episode, complete remission (HCC) - citalopram (CELEXA) 20 MG tablet; Take 1 tablet (20 mg total) by mouth daily.  Dispense: 90 tablet; Refill: 1  9. DDD (degenerative disc disease), lumbar Motrin or tylenol OTC Back exercises  10. Morbid obesity ([place]) Discussed diet and exercise for person with BMI >25 Will recheck weight in 3-6 months    Labs pending Health Maintenance reviewed Diet and exercise encouraged  Follow up plan: 6 months   Mary-Margaret Hassell Done, FNP

## 2021-09-02 NOTE — Patient Instructions (Signed)
Back Exercises °These exercises help to make your trunk and back strong. They also help to keep the lower back flexible. Doing these exercises can help to prevent or lessen pain in your lower back. °If you have back pain, try to do these exercises 2-3 times each day or as told by your doctor. °As you get better, do the exercises once each day. Repeat the exercises more often as told by your doctor. °To stop back pain from coming back, do the exercises once each day, or as told by your doctor. °Do exercises exactly as told by your doctor. Stop right away if you feel sudden pain or your pain gets worse. °Exercises °Single knee to chest °Do these steps 3-5 times in a row for each leg: °Lie on your back on a firm bed or the floor with your legs stretched out. °Bring one knee to your chest. °Grab your knee or thigh with both hands and hold it in place. °Pull on your knee until you feel a gentle stretch in your lower back or butt. °Keep doing the stretch for 10-30 seconds. °Slowly let go of your leg and straighten it. °Pelvic tilt °Do these steps 5-10 times in a row: °Lie on your back on a firm bed or the floor with your legs stretched out. °Bend your knees so they point up to the ceiling. Your feet should be flat on the floor. °Tighten your lower belly (abdomen) muscles to press your lower back against the floor. This will make your tailbone point up to the ceiling instead of pointing down to your feet or the floor. °Stay in this position for 5-10 seconds while you gently tighten your muscles and breathe evenly. °Cat-cow °Do these steps until your lower back bends more easily: °Get on your hands and knees on a firm bed or the floor. Keep your hands under your shoulders, and keep your knees under your hips. You may put padding under your knees. °Let your head hang down toward your chest. Tighten (contract) the muscles in your belly. Point your tailbone toward the floor so your lower back becomes rounded like the back of a  cat. °Stay in this position for 5 seconds. °Slowly lift your head. Let the muscles of your belly relax. Point your tailbone up toward the ceiling so your back forms a sagging arch like the back of a cow. °Stay in this position for 5 seconds. ° °Press-ups °Do these steps 5-10 times in a row: °Lie on your belly (face-down) on a firm bed or the floor. °Place your hands near your head, about shoulder-width apart. °While you keep your back relaxed and keep your hips on the floor, slowly straighten your arms to raise the top half of your body and lift your shoulders. Do not use your back muscles. You may change where you place your hands to make yourself more comfortable. °Stay in this position for 5 seconds. Keep your back relaxed. °Slowly return to lying flat on the floor. ° °Bridges °Do these steps 10 times in a row: °Lie on your back on a firm bed or the floor. °Bend your knees so they point up to the ceiling. Your feet should be flat on the floor. Your arms should be flat at your sides, next to your body. °Tighten your butt muscles and lift your butt off the floor until your waist is almost as high as your knees. If you do not feel the muscles working in your butt and the back of   your thighs, slide your feet 1-2 inches (2.5-5 cm) farther away from your butt. °Stay in this position for 3-5 seconds. °Slowly lower your butt to the floor, and let your butt muscles relax. °If this exercise is too easy, try doing it with your arms crossed over your chest. °Belly crunches °Do these steps 5-10 times in a row: °Lie on your back on a firm bed or the floor with your legs stretched out. °Bend your knees so they point up to the ceiling. Your feet should be flat on the floor. °Cross your arms over your chest. °Tip your chin a little bit toward your chest, but do not bend your neck. °Tighten your belly muscles and slowly raise your chest just enough to lift your shoulder blades a tiny bit off the floor. Avoid raising your body  higher than that because it can put too much stress on your lower back. °Slowly lower your chest and your head to the floor. °Back lifts °Do these steps 5-10 times in a row: °Lie on your belly (face-down) with your arms at your sides, and rest your forehead on the floor. °Tighten the muscles in your legs and your butt. °Slowly lift your chest off the floor while you keep your hips on the floor. Keep the back of your head in line with the curve in your back. Look at the floor while you do this. °Stay in this position for 3-5 seconds. °Slowly lower your chest and your face to the floor. °Contact a doctor if: °Your back pain gets a lot worse when you do an exercise. °Your back pain does not get better within 2 hours after you exercise. °If you have any of these problems, stop doing the exercises. Do not do them again unless your doctor says it is okay. °Get help right away if: °You have sudden, very bad back pain. If this happens, stop doing the exercises. Do not do them again unless your doctor says it is okay. °This information is not intended to replace advice given to you by your health care provider. Make sure you discuss any questions you have with your health care provider. °Document Revised: 02/09/2021 Document Reviewed: 02/09/2021 °Elsevier Patient Education © 2022 Elsevier Inc. ° °

## 2021-09-16 DIAGNOSIS — Z23 Encounter for immunization: Secondary | ICD-10-CM | POA: Diagnosis not present

## 2021-10-10 ENCOUNTER — Other Ambulatory Visit: Payer: Self-pay | Admitting: Nurse Practitioner

## 2021-10-10 DIAGNOSIS — R569 Unspecified convulsions: Secondary | ICD-10-CM

## 2021-11-08 ENCOUNTER — Other Ambulatory Visit: Payer: Self-pay | Admitting: Nurse Practitioner

## 2021-11-08 DIAGNOSIS — M5136 Other intervertebral disc degeneration, lumbar region: Secondary | ICD-10-CM

## 2021-11-11 ENCOUNTER — Ambulatory Visit
Admission: RE | Admit: 2021-11-11 | Discharge: 2021-11-11 | Disposition: A | Discharge status: Home / Self Care | Payer: Medicaid Other | Source: Ambulatory Visit | Attending: Nurse Practitioner | Admitting: Nurse Practitioner

## 2021-11-11 ENCOUNTER — Other Ambulatory Visit: Payer: Self-pay

## 2021-11-11 DIAGNOSIS — Z1231 Encounter for screening mammogram for malignant neoplasm of breast: Secondary | ICD-10-CM

## 2022-01-12 ENCOUNTER — Other Ambulatory Visit: Payer: Self-pay | Admitting: Nurse Practitioner

## 2022-01-12 DIAGNOSIS — M5136 Other intervertebral disc degeneration, lumbar region: Secondary | ICD-10-CM

## 2022-03-02 ENCOUNTER — Ambulatory Visit: Payer: Medicaid Other | Admitting: Nurse Practitioner

## 2022-03-10 ENCOUNTER — Other Ambulatory Visit: Payer: Self-pay | Admitting: Nurse Practitioner

## 2022-03-10 ENCOUNTER — Telehealth: Payer: Self-pay | Admitting: Nurse Practitioner

## 2022-03-10 ENCOUNTER — Ambulatory Visit: Payer: Medicaid Other | Admitting: Nurse Practitioner

## 2022-03-10 ENCOUNTER — Encounter: Payer: Self-pay | Admitting: Nurse Practitioner

## 2022-03-10 VITALS — BP 133/86 | HR 84 | Temp 97.1°F | Resp 20 | Ht 62.0 in | Wt 219.0 lb

## 2022-03-10 DIAGNOSIS — E7800 Pure hypercholesterolemia, unspecified: Secondary | ICD-10-CM | POA: Diagnosis not present

## 2022-03-10 DIAGNOSIS — R569 Unspecified convulsions: Secondary | ICD-10-CM | POA: Diagnosis not present

## 2022-03-10 DIAGNOSIS — E119 Type 2 diabetes mellitus without complications: Secondary | ICD-10-CM

## 2022-03-10 DIAGNOSIS — F410 Panic disorder [episodic paroxysmal anxiety] without agoraphobia: Secondary | ICD-10-CM

## 2022-03-10 DIAGNOSIS — I10 Essential (primary) hypertension: Secondary | ICD-10-CM

## 2022-03-10 DIAGNOSIS — M5136 Other intervertebral disc degeneration, lumbar region: Secondary | ICD-10-CM

## 2022-03-10 DIAGNOSIS — M25551 Pain in right hip: Secondary | ICD-10-CM

## 2022-03-10 DIAGNOSIS — Z6826 Body mass index (BMI) 26.0-26.9, adult: Secondary | ICD-10-CM

## 2022-03-10 DIAGNOSIS — Z23 Encounter for immunization: Secondary | ICD-10-CM | POA: Diagnosis not present

## 2022-03-10 DIAGNOSIS — F325 Major depressive disorder, single episode, in full remission: Secondary | ICD-10-CM | POA: Diagnosis not present

## 2022-03-10 DIAGNOSIS — F5101 Primary insomnia: Secondary | ICD-10-CM

## 2022-03-10 DIAGNOSIS — K219 Gastro-esophageal reflux disease without esophagitis: Secondary | ICD-10-CM | POA: Diagnosis not present

## 2022-03-10 LAB — LIPID PANEL
Chol/HDL Ratio: 3.1 ratio (ref 0.0–4.4)
Cholesterol, Total: 171 mg/dL (ref 100–199)
HDL: 55 mg/dL (ref >39)
LDL Chol Calc (NIH): 83 mg/dL (ref 0–99)
Triglycerides: 196 mg/dL — ABNORMAL HIGH (ref 0–149)
VLDL Cholesterol Cal: 33 mg/dL (ref 5–40)

## 2022-03-10 LAB — CMP14+EGFR
ALT: 20 IU/L (ref 0–32)
AST: 20 IU/L (ref 0–40)
Albumin/Globulin Ratio: 1.3 (ref 1.2–2.2)
Albumin: 4 g/dL (ref 3.8–4.9)
Alkaline Phosphatase: 94 IU/L (ref 44–121)
BUN/Creatinine Ratio: 21 (ref 9–23)
BUN: 21 mg/dL (ref 6–24)
Bilirubin Total: <0.2 mg/dL (ref 0.0–1.2)
CO2: 27 mmol/L (ref 20–29)
Calcium: 10.2 mg/dL (ref 8.7–10.2)
Chloride: 95 mmol/L — ABNORMAL LOW (ref 96–106)
Creatinine, Ser: 1 mg/dL (ref 0.57–1.00)
Globulin, Total: 3.1 g/dL (ref 1.5–4.5)
Glucose: 203 mg/dL — ABNORMAL HIGH (ref 70–99)
Potassium: 4.8 mmol/L (ref 3.5–5.2)
Sodium: 133 mmol/L — ABNORMAL LOW (ref 134–144)
Total Protein: 7.1 g/dL (ref 6.0–8.5)
eGFR: 66 mL/min/{1.73_m2} (ref >59)

## 2022-03-10 LAB — CBC WITH DIFFERENTIAL/PLATELET
Basophils Absolute: 0.1 10*3/uL (ref 0.0–0.2)
Basos: 1 %
EOS (ABSOLUTE): 0.5 10*3/uL — ABNORMAL HIGH (ref 0.0–0.4)
Eos: 7 %
Hematocrit: 39.7 % (ref 34.0–46.6)
Hemoglobin: 13.3 g/dL (ref 11.1–15.9)
Immature Grans (Abs): 0 10*3/uL (ref 0.0–0.1)
Immature Granulocytes: 0 %
Lymphocytes Absolute: 2 10*3/uL (ref 0.7–3.1)
Lymphs: 27 %
MCH: 31.3 pg (ref 26.6–33.0)
MCHC: 33.5 g/dL (ref 31.5–35.7)
MCV: 93 fL (ref 79–97)
Monocytes Absolute: 0.8 10*3/uL (ref 0.1–0.9)
Monocytes: 11 %
Neutrophils Absolute: 3.9 10*3/uL (ref 1.4–7.0)
Neutrophils: 54 %
Platelets: 289 10*3/uL (ref 150–450)
RBC: 4.25 x10E6/uL (ref 3.77–5.28)
RDW: 12 % (ref 11.7–15.4)
WBC: 7.2 10*3/uL (ref 3.4–10.8)

## 2022-03-10 LAB — BAYER DCA HB A1C WAIVED: HB A1C (BAYER DCA - WAIVED): 7.5 % — ABNORMAL HIGH (ref 4.8–5.6)

## 2022-03-10 MED ORDER — CITALOPRAM HYDROBROMIDE 20 MG PO TABS: 20.0000 mg | ORAL_TABLET | Freq: Every day | ORAL | 1 refills | Status: DC

## 2022-03-10 MED ORDER — CYCLOBENZAPRINE HCL 10 MG PO TABS: ORAL_TABLET | ORAL | 1 refills | Status: DC

## 2022-03-10 MED ORDER — ZOLPIDEM TARTRATE 10 MG PO TABS: 10.0000 mg | ORAL_TABLET | Freq: Every evening | ORAL | 5 refills | Status: DC | PRN

## 2022-03-10 MED ORDER — METFORMIN HCL 500 MG PO TABS: ORAL_TABLET | ORAL | 1 refills | Status: DC

## 2022-03-10 MED ORDER — METHYLPREDNISOLONE ACETATE 80 MG/ML IJ SUSP
80.0000 mg | Freq: Once | INTRAMUSCULAR | Status: AC
  Administered 2022-03-10: 80 mg via INTRAMUSCULAR

## 2022-03-10 MED ORDER — CARVEDILOL 6.25 MG PO TABS: 6.2500 mg | ORAL_TABLET | Freq: Two times a day (BID) | ORAL | 1 refills | Status: DC

## 2022-03-10 MED ORDER — CARBAMAZEPINE 200 MG PO TABS: ORAL_TABLET | ORAL | 5 refills | Status: DC

## 2022-03-10 MED ORDER — SIMVASTATIN 40 MG PO TABS: 40.0000 mg | ORAL_TABLET | Freq: Every day | ORAL | 1 refills | Status: DC

## 2022-03-10 MED ORDER — BUSPIRONE HCL 15 MG PO TABS: 15.0000 mg | ORAL_TABLET | Freq: Two times a day (BID) | ORAL | 5 refills | Status: DC

## 2022-03-10 MED ORDER — LISINOPRIL-HYDROCHLOROTHIAZIDE 20-25 MG PO TABS: 1.0000 | ORAL_TABLET | Freq: Every day | ORAL | 1 refills | Status: DC

## 2022-03-10 MED ORDER — CELECOXIB 200 MG PO CAPS: 200.0000 mg | ORAL_CAPSULE | Freq: Two times a day (BID) | ORAL | 2 refills | Status: DC

## 2022-03-10 NOTE — Patient Instructions (Signed)
Hip Pain The hip is the joint between the upper legs and the lower pelvis. The bones, cartilage, tendons, and muscles of your hip joint support your body and allow you to move around. Hip pain can range from a minor ache to severe pain in one or both of your hips. The pain may be felt on the inside of the hip joint near the groin, or on the outside near the buttocks and upper thigh. You may also have swelling or stiffness in your hip area. Follow these instructions at home: Managing pain, stiffness, and swelling   If directed, put ice on the painful area. To do this: Put ice in a plastic bag. Place a towel between your skin and the bag. Leave the ice on for 20 minutes, 2-3 times a day. If directed, apply heat to the affected area as often as told by your health care provider. Use the heat source that your health care provider recommends, such as a moist heat pack or a heating pad. Place a towel between your skin and the heat source. Leave the heat on for 20-30 minutes. Remove the heat if your skin turns bright red. This is especially important if you are unable to feel pain, heat, or cold. You may have a greater risk of getting burned. Activity Do exercises as told by your health care provider. Avoid activities that cause pain. General instructions  Take over-the-counter and prescription medicines only as told by your health care provider. Keep a journal of your symptoms. Write down: How often you have hip pain. The location of your pain. What the pain feels like. What makes the pain worse. Sleep with a pillow between your legs on your most comfortable side. Keep all follow-up visits as told by your health care provider. This is important. Contact a health care provider if: You cannot put weight on your leg. Your pain or swelling continues or gets worse after one week. It gets harder to walk. You have a fever. Get help right away if: You fall. You have a sudden increase in pain and  swelling in your hip. Your hip is red or swollen or very tender to touch. Summary Hip pain can range from a minor ache to severe pain in one or both of your hips. The pain may be felt on the inside of the hip joint near the groin, or on the outside near the buttocks and upper thigh. Avoid activities that cause pain. Write down how often you have hip pain, the location of the pain, what makes it worse, and what it feels like. This information is not intended to replace advice given to you by your health care provider. Make sure you discuss any questions you have with your health care provider. Document Revised: 04/14/2019 Document Reviewed: 04/14/2019 Elsevier Patient Education  2022 Elsevier Inc.  

## 2022-03-10 NOTE — Telephone Encounter (Signed)
45 grams of carbs with each meal and 15 grams of carbs for 2 snacks a day ?

## 2022-03-10 NOTE — Telephone Encounter (Signed)
?  Prescription Request ? ?03/10/2022 ? ?Is this a "Controlled Substance" medicine? no ? ?Have you seen your PCP in the last 2 weeks? no ? ?If YES, route message to pool  -  If NO, patient needs to be scheduled for appointment. ? ?What is the name of the medication or equipment? cyclobenzaprine (FLEXERIL) 10 MG tablet  ? ?Have you contacted your pharmacy to request a refill? Yes   ? ?Which pharmacy would you like this sent to? [facility] - [place], [place] - [address] ? ? ?Patient notified that their request is being sent to the clinical staff for review and that they should receive a response within 2 business days.  ?  ?

## 2022-03-10 NOTE — Addendum Note (Signed)
Addended by: Cleda Daub on: 03/10/2022 05:03 PM ? ? Modules accepted: Orders ? ?

## 2022-03-10 NOTE — Progress Notes (Signed)
? ?Subjective:  ? ? Patient ID: Ellen Delgado, female    DOB: 12/23/1965, 55 y.o.   MRN: [id number] ? ? ?Chief Complaint: medical management of chronic issues  ?  ? ?HPI: ? ?Ellen Delgado is a 55 y.o. who identifies as a female who was assigned female at birth.  ? ?Social history: ?Lives with: her bioyfriend ?Work history: is on disability ? ? ?Comes in today for follow up of the following chronic medical issues: ? ?1. Pure hypercholesterolemia ?Does not watch diet and does no dedicated exercise. ?Lab Results  ?Component Value Date  ? CHOL 180 09/02/2021  ? HDL 59 09/02/2021  ? LDLCALC 88 09/02/2021  ? TRIG 198 (H) 09/02/2021  ? CHOLHDL 3.1 09/02/2021  ?The 10-year ASCVD risk score (Arnett DK, et al., 2019) is: 5.2% ? ? ? ?2. Type 2 diabetes mellitus without complication, without long-term current use of insulin (HCC) ?Does not check blood sugars at home. Doe snt wathc diet at a. ?Lab Results  ?Component Value Date  ? HGBA1C 6.7 (H) 09/02/2021  ? ? ? ?3. Gastroesophageal reflux disease without esophagitis ?Is on no prescriptions  medications. ? ?4. Depression, major, single episode, complete remission (HCC) ?Is on celexa daily and is doing well. ? ?  03/10/2022  ? 10:34 AM 09/02/2021  ?  9:58 AM 07/22/2021  ? 10:01 AM  ?Depression screen PHQ 2/9  ?Decreased Interest 0 0 0  ?Down, Depressed, Hopeless 0 0 0  ?PHQ - 2 Score 0 0 0  ?Altered sleeping 0 0   ?Tired, decreased energy 3 0   ?Change in appetite 0 0   ?Feeling bad or failure about yourself  0 0   ?Trouble concentrating 0 0   ?Moving slowly or fidgety/restless 0 0   ?Suicidal thoughts 0 0   ?PHQ-9 Score 3 0   ?Difficult doing work/chores Very difficult Not difficult at all   ? ? ? ?5. Panic attacks ?Is on buspar BID . No issues ? ?  03/10/2022  ? 10:34 AM 09/02/2021  ?  9:58 AM 02/18/2021  ? 10:34 AM 05/07/2020  ? 10:36 AM  ?GAD 7 : Generalized Anxiety Score  ?Nervous, Anxious, on Edge 0 0 0 2  ?Control/stop worrying 1 0 1 1  ?Worry too much - different things 1 0  1 1  ?Trouble relaxing 0 0 1 1  ?Restless 0 0 0 2  ?Easily annoyed or irritable 0 0 0 0  ?Afraid - awful might happen 0 0 0 0  ?Total GAD 7 Score 2 0 3 7  ?Anxiety Difficulty Not difficult at all Not difficult at all Not difficult at all Somewhat difficult  ? ? ? ? ?6. Seizures (HCC) ?Denies any recent seizure activity ? ?7. Primary insomnia ?Is on ambien nightly. Says she sleeps about 8 hours a night ? ?8. DDD (degenerative disc disease), lumbar ?Has chronic back pain. She does take motrin or tylenol whenneeded. ? ?9. BMI 26.0-26.9,adult ?Weight is up 8lbs ?Wt Readings from Last 3 Encounters:  ?03/10/22 219 lb (99.3 kg)  ?09/02/21 211 lb 9.6 oz (96 kg)  ?07/22/21 209 lb (94.8 kg)  ? ?BMI Readings from Last 3 Encounters:  ?03/10/22 40.06 kg/m?  ?09/02/21 38.70 kg/m?  ?07/22/21 38.23 kg/m?  ? ? ? ? ?New complaints: ?Right  hip pain- hurts mainly when standing and walking ? ?Allergies  ?Allergen Reactions  ? Tdap [Tetanus-Diphth-Acell Pertussis] Shortness Of Breath  ?  sob  ? ?Outpatient Encounter Medications as   of 03/10/2022  ?Medication Sig  ? carbamazepine (TEGRETOL) 200 MG tablet TAKE  (1)  TABLET  FOUR TIMES DAILY.  ? aspirin EC 81 MG tablet Take 1 tablet (81 mg total) by mouth daily. (Patient taking differently: Take 81 mg by mouth 4 (four) times a week.)  ? blood glucose meter kit and supplies KIT Checks blood sugars at home fating in morning and PRN  ? busPIRone (BUSPAR) 15 MG tablet Take 1 tablet (15 mg total) by mouth 2 (two) times daily.  ? calcium-vitamin D (OSCAL WITH D) 500-200 MG-UNIT tablet Take 3 tablets by mouth daily.  ? carvedilol (COREG) 6.25 MG tablet Take 1 tablet (6.25 mg total) by mouth 2 (two) times daily with a meal.  ? citalopram (CELEXA) 20 MG tablet Take 1 tablet (20 mg total) by mouth daily.  ? cyclobenzaprine (FLEXERIL) 10 MG tablet TAKE (1) TABLET THREE TIMES DAILY.  ? lisinopril-hydrochlorothiazide (ZESTORETIC) 20-25 MG tablet Take 1 tablet by mouth daily.  ? metFORMIN (GLUCOPHAGE) 500  MG tablet TAKE 1 TABLET TWICE DAILY WITH A MEAL.  ? Multiple Vitamin (MULTIVITAMIN) tablet Take 1 tablet by mouth daily.  ? nystatin cream (MYCOSTATIN) Apply 1 application topically 2 (two) times daily.  ? simvastatin (ZOCOR) 40 MG tablet Take 1 tablet (40 mg total) by mouth daily at 6 PM.  ? valACYclovir (VALTREX) 1000 MG tablet 2 tablets bid at fever blister onset.  ? zolpidem (AMBIEN) 10 MG tablet Take 1 tablet (10 mg total) by mouth at bedtime as needed for sleep.  ? ?No facility-administered encounter medications on file as of 03/10/2022.  ? ? ?Past Surgical History:  ?Procedure Laterality Date  ? CHOLECYSTECTOMY    ? ? ?Family History  ?Problem Relation Age of Onset  ? Diabetes Mother   ? Diabetes Father   ? Coronary artery disease Father   ? Cancer Paternal Grandmother   ? Breast cancer Neg Hx   ? ? ? ? ?Controlled substance contract: n/a ? ? ? ? ?Review of Systems  ?Constitutional:  Negative for diaphoresis.  ?Eyes:  Negative for pain.  ?Respiratory:  Negative for shortness of breath.   ?Cardiovascular:  Negative for chest pain, palpitations and leg swelling.  ?Gastrointestinal:  Negative for abdominal pain.  ?Endocrine: Negative for polydipsia.  ?Skin:  Negative for rash.  ?Neurological:  Negative for dizziness, weakness and headaches.  ?Hematological:  Does not bruise/bleed easily.  ?All other systems reviewed and are negative. ? ?   ?Objective:  ? Physical Exam ?Vitals and nursing note reviewed.  ?Constitutional:   ?   General: She is not in acute distress. ?   Appearance: Normal appearance. She is well-developed.  ?HENT:  ?   Head: Normocephalic.  ?   Right Ear: Tympanic membrane normal.  ?   Left Ear: Tympanic membrane normal.  ?   Nose: Nose normal.  ?   Mouth/Throat:  ?   Mouth: Mucous membranes are moist.  ?Eyes:  ?   Pupils: Pupils are equal, round, and reactive to light.  ?Neck:  ?   Vascular: No carotid bruit or JVD.  ?Cardiovascular:  ?   Rate and Rhythm: Normal rate and regular rhythm.  ?   Heart  sounds: Normal heart sounds.  ?Pulmonary:  ?   Effort: Pulmonary effort is normal. No respiratory distress.  ?   Breath sounds: Normal breath sounds. No wheezing or rales.  ?Chest:  ?   Chest wall: No tenderness.  ?Abdominal:  ?   General: Bowel   sounds are normal. There is no distension or abdominal bruit.  ?   Palpations: Abdomen is soft. There is no hepatomegaly, splenomegaly, mass or pulsatile mass.  ?   Tenderness: There is no abdominal tenderness.  ?Musculoskeletal:     ?   General: Normal range of motion.  ?   Cervical back: Normal range of motion and neck supple.  ?Lymphadenopathy:  ?   Cervical: No cervical adenopathy.  ?Skin: ?   General: Skin is warm and dry.  ?Neurological:  ?   Mental Status: She is alert and oriented to person, place, and time.  ?   Deep Tendon Reflexes: Reflexes are normal and symmetric.  ?Psychiatric:     ?   Behavior: Behavior normal.     ?   Thought Content: Thought content normal.     ?   Judgment: Judgment normal.  ? ? ?BP 133/86   Pulse 84   Temp (!) 97.1 ?F (36.2 ?C) (Temporal)   Resp 20   Ht 5' 2" (1.575 m)   Wt 219 lb (99.3 kg)   SpO2 100%   BMI 40.06 kg/m?  ? ?Hgba1c 7.5% ? ?   ?Assessment & Plan:  ? ?Ellen Delgado comes in today with chief complaint of Medical Management of Chronic Issues (Back pain. Wants to get tramadol again) ? ? ?Diagnosis and orders addressed: ? ?1. Pure hypercholesterolemia ?Low fat diet ?- Lipid panel ?- simvastatin (ZOCOR) 40 MG tablet; Take 1 tablet (40 mg total) by mouth daily at 6 PM.  Dispense: 90 tablet; Refill: 1 ? ?2. Type 2 diabetes mellitus without complication, without long-term current use of insulin ([place]) ?Stricter carb counting ?May add GLP! At next visit if no better ?- Bayer DCA Hb A1c Waived ?- CBC with Differential/Platelet ?- CMP14+EGFR ?- Microalbumin / creatinine urine ratio ?- metFORMIN (GLUCOPHAGE) 500 MG tablet; TAKE 1 TABLET TWICE DAILY WITH A MEAL.  Dispense: 180 tablet; Refill: 1 ? ?3. Gastroesophageal reflux  disease without esophagitis ?Avoid spicy foods ?Do not eat 2 hours prior to bedtime ? ? ?4. Depression, major, single episode, complete remission ([place]) ?Stress management ?- citalopram (CELEXA) 20 MG tablet; Ta

## 2022-03-10 NOTE — Telephone Encounter (Signed)
Pt aware refill sent to pharmacy 

## 2022-03-10 NOTE — Addendum Note (Signed)
Addended by: Bennie Pierini on: 03/10/2022 04:10 PM ? ? Modules accepted: Orders ? ?

## 2022-03-11 LAB — MICROALBUMIN / CREATININE URINE RATIO
Creatinine, Urine: 64.9 mg/dL
Microalb/Creat Ratio: 7 mg/g creat (ref 0–29)
Microalbumin, Urine: 4.8 ug/mL

## 2022-03-13 NOTE — Telephone Encounter (Signed)
Pt aware.

## 2022-04-10 ENCOUNTER — Telehealth: Payer: Self-pay | Admitting: Nurse Practitioner

## 2022-04-10 NOTE — Telephone Encounter (Signed)
Patient notified to not eat more than 1/2 cup at a time and also discussed other food choices. I advised that she may benefit from seeing our clinical pharmacist here in the office. Will speak with clinical pharmacist and call patient back about appt.  ?

## 2022-04-11 ENCOUNTER — Telehealth: Payer: Self-pay

## 2022-04-11 NOTE — Telephone Encounter (Signed)
Patient needs some diet teaching for her diabetes. She does not drive and has a hard time getting to appts unless they are on Fridays. Would it be possible to do diet teaching through a visit over the phone or would she have to come in person? Please advise and let me know and I will contact the patient ?

## 2022-04-12 NOTE — Telephone Encounter (Signed)
Appt made for tomorrow for telephone visit. Patient notified and verbalized understanding ?

## 2022-04-12 NOTE — Telephone Encounter (Signed)
Yeah that would be okay to schedule telephone pharmacy clinic visit! I can always mail materials as well if she needs after the appt ?

## 2022-04-13 ENCOUNTER — Ambulatory Visit (INDEPENDENT_AMBULATORY_CARE_PROVIDER_SITE_OTHER): Payer: Medicaid Other | Admitting: Pharmacist

## 2022-04-13 DIAGNOSIS — E119 Type 2 diabetes mellitus without complications: Secondary | ICD-10-CM

## 2022-04-13 MED ORDER — TRULICITY 0.75 MG/0.5ML ~~LOC~~ SOAJ: 0.7500 mg | SUBCUTANEOUS | 1 refills | Status: DC

## 2022-04-13 NOTE — Progress Notes (Signed)
? ? ?  04/13/2022 ?Name: Ellen Delgado MRN: [id number] DOB: 02-22-1966 ? ? ?S:  73 yoF Presents for diabetes evaluation, education, and management.  Patient was referred and last seen by Primary Care Provider on 03/10/22. ? ?Insurance coverage/medication affordability: medicaid ? ?Patient reports adherence with medications. ?Current diabetes medications include: metformin, trulicity ?Current hypertension medications include: carvedilol, lisinopril/hctz ?Goal 130/80 ?Current hyperlipidemia medications include: simvastatin ?  ?Patient denies hypoglycemic events. ?  ?Patient reported dietary habits: Eats 3 meals/day ? ?Breakfast:unsure ? ?Lunch:unsure ? ?Dinner: sweet potatoes, green bean/gb casserole ? ?Snacks: greek yogurt light ? ?Drinks: natural twist (no sugar), diet sun drops ? ?Patient-reported exercise habits: unable ? ?O: ? ?Lab Results  ?Component Value Date  ? HGBA1C 7.5 (H) 03/10/2022  ?  ? ?Lipid Panel ? ?   ?Component Value Date/Time  ? CHOL 171 03/10/2022 1033  ? CHOL 204 (H) 04/28/2013 0920  ? TRIG 196 (H) 03/10/2022 1033  ? TRIG 310 (H) 03/18/2015 1046  ? TRIG 348 (H) 04/28/2013 0920  ? HDL 55 03/10/2022 1033  ? HDL 47 03/18/2015 1046  ? HDL 42 04/28/2013 0920  ? CHOLHDL 3.1 03/10/2022 1033  ? LDLCALC 83 03/10/2022 1033  ? LDLCALC 81 07/29/2014 0913  ? LDLCALC 92 04/28/2013 0920  ? ? ?Home fasting blood sugars: <130 ? ?2 hour post-meal/random blood sugars: n/a. ?  ? ?Clinical Atherosclerotic Cardiovascular Disease (ASCVD): No  ? ?The 10-year ASCVD risk score (Arnett DK, et al., 2019) is: 5.2% ?  Values used to calculate the score: ?    Age: 49 years ?    Sex: Female ?    Is Non-Hispanic African American: No ?    Diabetic: Yes ?    Tobacco smoker: No ?    Systolic Blood Pressure: 133 mmHg ?    Is BP treated: Yes ?    HDL Cholesterol: 55 mg/dL ?    Total Cholesterol: 171 mg/dL ?  ?A/P: ? ?Diabetes T2DM currently uncontrolled. Patient is adherent with medication.  ? ?-Continue metformin ? ?-START GLP-1  (trulicity)--patient would like additional weight loss ? ?-Extensively discussed pathophysiology of diabetes, recommended lifestyle interventions, dietary effects on blood sugar control ? ?-Counseled on s/sx of and management of hypoglycemia ? ?Written patient instructions provided.  Total time in counseling 25 minutes.  ? ? ? ?Kieth Brightly, PharmD, BCPS ?Clinical Pharmacist, [facility] ?[place]  II Phone [phone] ? ? ?

## 2022-04-27 ENCOUNTER — Ambulatory Visit (INDEPENDENT_AMBULATORY_CARE_PROVIDER_SITE_OTHER): Payer: Medicaid Other | Admitting: Pharmacist

## 2022-04-27 DIAGNOSIS — E119 Type 2 diabetes mellitus without complications: Secondary | ICD-10-CM | POA: Diagnosis not present

## 2022-04-27 MED ORDER — TRULICITY 1.5 MG/0.5ML ~~LOC~~ SOAJ: 1.5000 mg | SUBCUTANEOUS | 2 refills | Status: DC

## 2022-05-13 ENCOUNTER — Other Ambulatory Visit: Payer: Self-pay | Admitting: Nurse Practitioner

## 2022-05-13 DIAGNOSIS — S91352A Open bite, left foot, initial encounter: Secondary | ICD-10-CM | POA: Diagnosis not present

## 2022-05-13 DIAGNOSIS — M5136 Other intervertebral disc degeneration, lumbar region: Secondary | ICD-10-CM

## 2022-05-13 DIAGNOSIS — L089 Local infection of the skin and subcutaneous tissue, unspecified: Secondary | ICD-10-CM | POA: Diagnosis not present

## 2022-05-13 DIAGNOSIS — W5501XA Bitten by cat, initial encounter: Secondary | ICD-10-CM | POA: Diagnosis not present

## 2022-05-16 NOTE — Progress Notes (Signed)
     04/27/2022 Name: Ellen Delgado MRN: [id number] DOB: 05-04-1966    S:  56 yoF Presents for follow up diabetes evaluation, education, and management.  Patient was referred and last seen by Primary Care Provider on 03/10/22.  She has been tolreating new start trulicity well and denies side effects.  She has been working on her diet and lifestyle changes.   Insurance coverage/medication affordability: medicaid   Patient reports adherence with medications. Current diabetes medications include: metformin, trulicity Current hypertension medications include: carvedilol, lisinopril/hctz Goal 130/80 Current hyperlipidemia medications include: simvastatin   Patient denies hypoglycemic events.   Patient reported dietary habits: Eats 3 meals/day   Breakfast:unsure   Lunch:unsure   Dinner: sweet potatoes, green bean/gb casserole   Snacks: greek yogurt light   Drinks: natural twist (no sugar), diet sun drops   Patient-reported exercise habits: unable   O:   Recent Labs       Lab Results  Component Value Date    HGBA1C 7.5 (H) 03/10/2022        Lipid Panel   Labs (Brief)          Component Value Date/Time    CHOL 171 03/10/2022 1033    CHOL 204 (H) 04/28/2013 0920    TRIG 196 (H) 03/10/2022 1033    TRIG 310 (H) 03/18/2015 1046    TRIG 348 (H) 04/28/2013 0920    HDL 55 03/10/2022 1033    HDL 47 03/18/2015 1046    HDL 42 04/28/2013 0920    CHOLHDL 3.1 03/10/2022 1033    LDLCALC 83 03/10/2022 1033    LDLCALC 81 07/29/2014 0913    LDLCALC 92 04/28/2013 0920        Home fasting blood sugars: <130   2 hour post-meal/random blood sugars: n/a.     Clinical Atherosclerotic Cardiovascular Disease (ASCVD): No    The 10-year ASCVD risk score (Arnett DK, et al., 2019) is: 5.2%   Values used to calculate the score:     Age: 56 years     Sex: Female     Is Non-Hispanic African American: No     Diabetic: Yes     Tobacco smoker: No     Systolic Blood Pressure: 133  mmHg     Is BP treated: Yes     HDL Cholesterol: 55 mg/dL     Total Cholesterol: 171 mg/dL   A/P:   Diabetes R7EY currently uncontrolled. Patient is adherent with medication.    -Continue metformin   -INCREASE GLP-1 (trulicity) TO 1.5MG  WEEKLY--patient would like additional weight loss  Patient tolerating well  Denies personal and family history of Medullary thyroid cancer (MTC)   -Extensively discussed pathophysiology of diabetes, recommended lifestyle interventions, dietary effects on blood sugar control   -Counseled on s/sx of and management of hypoglycemia   Written patient instructions provided.  Total time in counseling 25 minutes.        Kieth Brightly, PharmD, BCPS Clinical Pharmacist, [facility]  II Phone [phone]

## 2022-05-18 ENCOUNTER — Encounter: Payer: Self-pay | Admitting: Pharmacist

## 2022-05-18 ENCOUNTER — Ambulatory Visit (INDEPENDENT_AMBULATORY_CARE_PROVIDER_SITE_OTHER): Payer: Medicaid Other | Admitting: Pharmacist

## 2022-05-18 VITALS — Wt 206.0 lb

## 2022-05-18 DIAGNOSIS — E119 Type 2 diabetes mellitus without complications: Secondary | ICD-10-CM | POA: Diagnosis not present

## 2022-05-18 DIAGNOSIS — E7800 Pure hypercholesterolemia, unspecified: Secondary | ICD-10-CM | POA: Diagnosis not present

## 2022-05-18 NOTE — Progress Notes (Signed)
    05/18/2022 Name: Ellen Delgado MRN: [id number] DOB: 11/26/1966    S:  56 yoF Presents for follow up diabetes evaluation, education, and management.  Patient was referred and last seen by Primary Care Provider on 03/10/22.  She has been tolreating new start trulicity well and denies side effects.  She is currently on the 1.5mg  dose of trulicity and has lost from 291lb-->206lb.  She continues to work hard on her diet and lifestyle changes.   Insurance coverage/medication affordability: medicaid   Patient reports adherence with medications. Current diabetes medications include: metformin, trulicity Current hypertension medications include: carvedilol, lisinopril/hctz Goal 130/80 Current hyperlipidemia medications include: simvastatin   Patient denies hypoglycemic events.   Patient reported dietary habits: Eats 3 meals/day   Breakfast:unsure, not eating much   Lunch:unsure   Dinner: sweet potatoes, green bean/gb casserole, salads   Snacks: greek yogurt light   Drinks: natural twist (no sugar), diet sun drops,water   Patient-reported exercise habits: unable    O:  Lab Results  Component Value Date   HGBA1C 7.5 (H) 03/10/2022   Lipid Panel     Component Value Date/Time   CHOL 171 03/10/2022 1033   CHOL 204 (H) 04/28/2013 0920   TRIG 196 (H) 03/10/2022 1033   TRIG 310 (H) 03/18/2015 1046   TRIG 348 (H) 04/28/2013 0920   HDL 55 03/10/2022 1033   HDL 47 03/18/2015 1046   HDL 42 04/28/2013 0920   CHOLHDL 3.1 03/10/2022 1033   LDLCALC 83 03/10/2022 1033   LDLCALC 81 07/29/2014 0913   LDLCALC 92 04/28/2013 0920     Home fasting blood sugars: <130  2 hour post-meal/random blood sugars: <180.    Clinical Atherosclerotic Cardiovascular Disease (ASCVD): No   The 10-year ASCVD risk score (Arnett DK, et al., 2019) is: 4.1%   Values used to calculate the score:     Age: 56 years     Sex: Female     Is Non-Hispanic African American: No     Diabetic: Yes     Tobacco  smoker: No     Systolic Blood Pressure: 118 mmHg     Is BP treated: Yes     HDL Cholesterol: 55 mg/dL     Total Cholesterol: 171 mg/dL    A/P:  Diabetes F6EP currently CONTROLLED.  Patient is adherent with medication.   -Continue metformin  -planning to discontinue metformin at f/u as able   -CONTINUE GLP-1 (trulicity) 1.5MG  WEEKLY-             Patient tolerating well--CONSIDER INCREASE TO 3MG  WEEKLY AS TOLERATED AFTER PCP F/U ON 7/723             Denies personal and family history of Medullary thyroid cancer (MTC)   -Extensively discussed pathophysiology of diabetes, recommended lifestyle interventions, dietary effects on blood sugar control  -Counseled on s/sx of and management of hypoglycemia  -Next A1C anticipated 06/16/22.    Written patient instructions provided.  Total time in counseling 25 minutes.   Follow up PCP Clinic Visit ON 06/16/22.   08/17/22, PharmD, BCPS Clinical Pharmacist, [facility]  II Phone [phone]    [phone], PharmD, BCPS Clinical Pharmacist, [facility]  II Phone [phone]

## 2022-05-29 ENCOUNTER — Other Ambulatory Visit: Payer: Self-pay | Admitting: Nurse Practitioner

## 2022-05-29 DIAGNOSIS — M25551 Pain in right hip: Secondary | ICD-10-CM

## 2022-06-08 ENCOUNTER — Ambulatory Visit (INDEPENDENT_AMBULATORY_CARE_PROVIDER_SITE_OTHER): Payer: Medicaid Other | Admitting: Pharmacist

## 2022-06-08 DIAGNOSIS — E119 Type 2 diabetes mellitus without complications: Secondary | ICD-10-CM | POA: Diagnosis not present

## 2022-06-08 NOTE — Progress Notes (Signed)
    06/08/2022 Name: Ellen Delgado MRN: [id number] DOB: 07/07/1966   S:  56 yoF Presents for follow up diabetes evaluation, education, and management.  Patient was referred and last seen by Primary Care Provider on 03/10/22.  She has been tolerating trulicity well and denies side effects.  She has been working on her diet and lifestyle changes. She reports about a 20lb weight loss   Insurance coverage/medication affordability: medicaid   Patient reports adherence with medications. Current diabetes medications include: metformin, trulicity Current hypertension medications include: carvedilol, lisinopril/hctz Goal 130/80 Current hyperlipidemia medications include: simvastatin   Patient denies hypoglycemic events.   Patient reported dietary habits: Eats 3 meals/day   Breakfast:unsure   Lunch:unsure   Dinner: sweet potatoes, green bean/gb casserole   Snacks: greek yogurt light   Drinks: natural twist (no sugar), diet sun drops   Patient-reported exercise habits: unable  O:  Lab Results  Component Value Date   HGBA1C 7.5 (H) 03/10/2022    There were no vitals filed for this visit.   Lipid Panel     Component Value Date/Time   CHOL 171 03/10/2022 1033   CHOL 204 (H) 04/28/2013 0920   TRIG 196 (H) 03/10/2022 1033   TRIG 310 (H) 03/18/2015 1046   TRIG 348 (H) 04/28/2013 0920   HDL 55 03/10/2022 1033   HDL 47 03/18/2015 1046   HDL 42 04/28/2013 0920   CHOLHDL 3.1 03/10/2022 1033   LDLCALC 83 03/10/2022 1033   LDLCALC 81 07/29/2014 0913   LDLCALC 92 04/28/2013 0920    Home fasting blood sugars: <130  2 hour post-meal/random blood sugars: <180.    Clinical Atherosclerotic Cardiovascular Disease (ASCVD): No   The 10-year ASCVD risk score (Arnett DK, et al., 2019) is: 4.1%   Values used to calculate the score:     Age: 56 years (Arnett DK, et al., 2019) is: 4.1%   Values used to calculate the score:     Age: 44 years     Sex: Female     Is Non-Hispanic African American: No     Diabetic: Yes     Tobacco smoker: No     Systolic Blood Pressure: 118  mmHg     Is BP treated: Yes     HDL Cholesterol: 55 mg/dL     Total Cholesterol: 171 mg/dL    A/P:  Diabetes L8XQ currently CONTROLLED. Patient is adherent with medication.   -Continue metformin   -CONTINUE GLP-1 (Trulicity) 1.5MG  WEEKLY--patient would like additional weight loss             Patient tolerating well             Denies personal and family history of Medullary thyroid cancer (MTC)  Consider increasing to 3mg  weekly at follow up; may d/c metformin   -Extensively discussed pathophysiology of diabetes, recommended lifestyle interventions, dietary effects on blood sugar control  -Counseled on s/sx of and management of hypoglycemia  -Next A1C anticipated 06/16/22.    Written patient instructions provided.  Total time in face to face counseling 25 minutes.   Follow up Pharmacist/PCP Clinic Visit ON 06/16/22.   08/17/22, PharmD, BCPS Clinical Pharmacist, [facility]  II Phone [phone]

## 2022-06-16 ENCOUNTER — Encounter: Payer: Self-pay | Admitting: Nurse Practitioner

## 2022-06-16 ENCOUNTER — Ambulatory Visit: Payer: Medicaid Other | Admitting: Nurse Practitioner

## 2022-06-16 VITALS — BP 108/74 | HR 87 | Temp 98.0°F | Resp 20 | Ht 62.0 in | Wt 207.0 lb

## 2022-06-16 DIAGNOSIS — I10 Essential (primary) hypertension: Secondary | ICD-10-CM | POA: Diagnosis not present

## 2022-06-16 DIAGNOSIS — K219 Gastro-esophageal reflux disease without esophagitis: Secondary | ICD-10-CM | POA: Diagnosis not present

## 2022-06-16 DIAGNOSIS — F325 Major depressive disorder, single episode, in full remission: Secondary | ICD-10-CM

## 2022-06-16 DIAGNOSIS — R569 Unspecified convulsions: Secondary | ICD-10-CM

## 2022-06-16 DIAGNOSIS — Z23 Encounter for immunization: Secondary | ICD-10-CM

## 2022-06-16 DIAGNOSIS — F410 Panic disorder [episodic paroxysmal anxiety] without agoraphobia: Secondary | ICD-10-CM

## 2022-06-16 DIAGNOSIS — F5101 Primary insomnia: Secondary | ICD-10-CM

## 2022-06-16 DIAGNOSIS — M5136 Other intervertebral disc degeneration, lumbar region: Secondary | ICD-10-CM | POA: Diagnosis not present

## 2022-06-16 DIAGNOSIS — E119 Type 2 diabetes mellitus without complications: Secondary | ICD-10-CM | POA: Diagnosis not present

## 2022-06-16 DIAGNOSIS — E7800 Pure hypercholesterolemia, unspecified: Secondary | ICD-10-CM

## 2022-06-16 DIAGNOSIS — Z6826 Body mass index (BMI) 26.0-26.9, adult: Secondary | ICD-10-CM

## 2022-06-16 DIAGNOSIS — M51369 Other intervertebral disc degeneration, lumbar region without mention of lumbar back pain or lower extremity pain: Secondary | ICD-10-CM

## 2022-06-16 LAB — BAYER DCA HB A1C WAIVED: HB A1C (BAYER DCA - WAIVED): 6.7 % — ABNORMAL HIGH (ref 4.8–5.6)

## 2022-06-16 MED ORDER — CELECOXIB 200 MG PO CAPS: 200.0000 mg | ORAL_CAPSULE | Freq: Two times a day (BID) | ORAL | 1 refills | Status: DC

## 2022-06-16 MED ORDER — CARBAMAZEPINE 200 MG PO TABS: ORAL_TABLET | ORAL | 5 refills | Status: DC

## 2022-06-16 MED ORDER — SIMVASTATIN 40 MG PO TABS: 40.0000 mg | ORAL_TABLET | Freq: Every day | ORAL | 1 refills | Status: DC

## 2022-06-16 MED ORDER — TRULICITY 3 MG/0.5ML ~~LOC~~ SOAJ: 3.0000 mg | SUBCUTANEOUS | 3 refills | Status: DC

## 2022-06-16 MED ORDER — METFORMIN HCL 500 MG PO TABS: ORAL_TABLET | ORAL | 1 refills | Status: DC

## 2022-06-16 MED ORDER — LISINOPRIL-HYDROCHLOROTHIAZIDE 20-25 MG PO TABS: 1.0000 | ORAL_TABLET | Freq: Every day | ORAL | 1 refills | Status: DC

## 2022-06-16 MED ORDER — CITALOPRAM HYDROBROMIDE 20 MG PO TABS: 20.0000 mg | ORAL_TABLET | Freq: Every day | ORAL | 1 refills | Status: DC

## 2022-06-16 MED ORDER — CARVEDILOL 6.25 MG PO TABS: 6.2500 mg | ORAL_TABLET | Freq: Two times a day (BID) | ORAL | 1 refills | Status: DC

## 2022-06-16 MED ORDER — BUSPIRONE HCL 15 MG PO TABS: 15.0000 mg | ORAL_TABLET | Freq: Two times a day (BID) | ORAL | 5 refills | Status: DC

## 2022-06-16 MED ORDER — ZOLPIDEM TARTRATE 10 MG PO TABS: 10.0000 mg | ORAL_TABLET | Freq: Every evening | ORAL | 5 refills | Status: DC | PRN

## 2022-06-16 NOTE — Addendum Note (Signed)
Addended by: Cleda Daub on: 06/16/2022 03:55 PM   Modules accepted: Orders

## 2022-06-16 NOTE — Patient Instructions (Signed)
Diabetes Mellitus and Foot Care Foot care is an important part of your health, especially when you have diabetes. Diabetes may cause you to have problems because of poor blood flow (circulation) to your feet and legs, which can cause your skin to: Become thinner and drier. Break more easily. Heal more slowly. Peel and crack. You may also have nerve damage (neuropathy) in your legs and feet, causing decreased feeling in them. This means that you may not notice minor injuries to your feet that could lead to more serious problems. Noticing and addressing any potential problems early is the best way to prevent future foot problems. How to care for your feet Foot hygiene  Wash your feet daily with warm water and mild soap. Do not use hot water. Then, pat your feet and the areas between your toes until they are completely dry. Do not soak your feet as this can dry your skin. Trim your toenails straight across. Do not dig under them or around the cuticle. File the edges of your nails with an emery board or nail file. Apply a moisturizing lotion or petroleum jelly to the skin on your feet and to dry, brittle toenails. Use lotion that does not contain alcohol and is unscented. Do not apply lotion between your toes. Shoes and socks Wear clean socks or stockings every day. Make sure they are not too tight. Do not wear knee-high stockings since they may decrease blood flow to your legs. Wear shoes that fit properly and have enough cushioning. Always look in your shoes before you put them on to be sure there are no objects inside. To break in new shoes, wear them for just a few hours a day. This prevents injuries on your feet. Wounds, scrapes, corns, and calluses  Check your feet daily for blisters, cuts, bruises, sores, and redness. If you cannot see the bottom of your feet, use a mirror or ask someone for help. Do not cut corns or calluses or try to remove them with medicine. If you find a minor scrape,  cut, or break in the skin on your feet, keep it and the skin around it clean and dry. You may clean these areas with mild soap and water. Do not clean the area with peroxide, alcohol, or iodine. If you have a wound, scrape, corn, or callus on your foot, look at it several times a day to make sure it is healing and not infected. Check for: Redness, swelling, or pain. Fluid or blood. Warmth. Pus or a bad smell. General tips Do not cross your legs. This may decrease blood flow to your feet. Do not use heating pads or hot water bottles on your feet. They may burn your skin. If you have lost feeling in your feet or legs, you may not know this is happening until it is too late. Protect your feet from hot and cold by wearing shoes, such as at the beach or on hot pavement. Schedule a complete foot exam at least once a year (annually) or more often if you have foot problems. Report any cuts, sores, or bruises to your health care provider immediately. Where to find more information American Diabetes Association: www.diabetes.org Association of Diabetes Care & Education Specialists: www.diabeteseducator.org Contact a health care provider if: You have a medical condition that increases your risk of infection and you have any cuts, sores, or bruises on your feet. You have an injury that is not healing. You have redness on your legs or feet. You   feel burning or tingling in your legs or feet. You have pain or cramps in your legs and feet. Your legs or feet are numb. Your feet always feel cold. You have pain around any toenails. Get help right away if: You have a wound, scrape, corn, or callus on your foot and: You have pain, swelling, or redness that gets worse. You have fluid or blood coming from the wound, scrape, corn, or callus. Your wound, scrape, corn, or callus feels warm to the touch. You have pus or a bad smell coming from the wound, scrape, corn, or callus. You have a fever. You have a red  line going up your leg. Summary Check your feet every day for blisters, cuts, bruises, sores, and redness. Apply a moisturizing lotion or petroleum jelly to the skin on your feet and to dry, brittle toenails. Wear shoes that fit properly and have enough cushioning. If you have foot problems, report any cuts, sores, or bruises to your health care provider immediately. Schedule a complete foot exam at least once a year (annually) or more often if you have foot problems. This information is not intended to replace advice given to you by your health care provider. Make sure you discuss any questions you have with your health care provider. Document Revised: 06/17/2020 Document Reviewed: 06/17/2020 Elsevier Patient Education  2023 Elsevier Inc.  

## 2022-06-16 NOTE — Progress Notes (Signed)
Subjective:    Patient ID: Ellen Delgado, female    DOB: [birth year], 56 y.o.   MRN: [id number]   Chief Complaint: medical management of chronic issues     HPI:  Ellen Delgado is a 56 y.o. who identifies as a female who was assigned female at birth.   Social history: Lives with: boyfriend Work history: disability   Comes in today for follow up of the following chronic medical issues:  1. Essential hypertension No c/o chest pain, sob or headache. Does not check blood pressure at home. BP Readings from Last 3 Encounters:  03/10/22 133/86  09/02/21 135/84  07/22/21 119/73     2. Pure hypercholesterolemia Does not watch diet and does no exercise. Lab Results  Component Value Date   CHOL 171 03/10/2022   HDL 55 03/10/2022   LDLCALC 83 03/10/2022   TRIG 196 (H) 03/10/2022   CHOLHDL 3.1 03/10/2022   The 10-year ASCVD risk score (Arnett DK, et al., 2019) is: 3.5%   3. Type 2 diabetes mellitus without complication, without long-term current use of insulin (HCC) She does not check her blood sugars at home. Lab Results  Component Value Date   HGBA1C 7.5 (H) 03/10/2022     4. Gastroesophageal reflux disease without esophagitis Is on no prescription medications. Occasionally has heart burn.  5. Seizures ([place]) Has not had any seizure activity in years. Is no longer on no seizure medications.  6. Depression, major, single episode, complete remission ([place]) Is on celexa daily and is doing well.    06/16/2022   11:05 AM 03/10/2022   10:34 AM 09/02/2021    9:58 AM  Depression screen PHQ 2/9  Decreased Interest 0 0 0  Down, Depressed, Hopeless 0 0 0  PHQ - 2 Score 0 0 0  Altered sleeping 0 0 0  Tired, decreased energy 0 3 0  Change in appetite 0 0 0  Feeling bad or failure about yourself  0 0 0  Trouble concentrating 0 0 0  Moving slowly or fidgety/restless 0 0 0  Suicidal thoughts 0 0 0  PHQ-9 Score 0 3 0  Difficult doing work/chores Not difficult at all Very  difficult Not difficult at all     7. Panic attacks Is on buspar as needed    06/16/2022   11:05 AM 03/10/2022   10:34 AM 09/02/2021    9:58 AM 02/18/2021   10:34 AM  GAD 7 : Generalized Anxiety Score  Nervous, Anxious, on Edge 0 0 0 0  Control/stop worrying 0 1 0 1  Worry too much - different things 0 1 0 1  Trouble relaxing 0 0 0 1  Restless 0 0 0 0  Easily annoyed or irritable 0 0 0 0  Afraid - awful might happen 0 0 0 0  Total GAD 7 Score 0 2 0 3  Anxiety Difficulty Not difficult at all Not difficult at all Not difficult at all Not difficult at all      8. Primary insomnia Is on ambien to sleep at night  9. DDD (degenerative disc disease), lumbar Has chronic back pain.  10. BMI 26.0-26.9,adult No recent weight changes Wt Readings from Last 3 Encounters:  06/16/22 207 lb (93.9 kg)  05/18/22 206 lb (93.4 kg)  03/10/22 219 lb (99.3 kg)   BMI Readings from Last 3 Encounters:  06/16/22 37.86 kg/m  05/18/22 37.68 kg/m  03/10/22 40.06 kg/m     New complaints: None today  Allergies  Allergen Reactions   Tdap [Tetanus-Diphth-Acell Pertussis] Shortness Of Breath    sob   Outpatient Encounter Medications as of 06/16/2022  Medication Sig   aspirin EC 81 MG tablet Take 1 tablet (81 mg total) by mouth daily. (Patient taking differently: Take 81 mg by mouth 4 (four) times a week.)   blood glucose meter kit and supplies KIT Checks blood sugars at home fating in morning and PRN   busPIRone (BUSPAR) 15 MG tablet Take 1 tablet (15 mg total) by mouth 2 (two) times daily.   calcium-vitamin D (OSCAL WITH D) 500-200 MG-UNIT tablet Take 3 tablets by mouth daily.   carbamazepine (TEGRETOL) 200 MG tablet TAKE  (1)  TABLET  FOUR TIMES DAILY.   carvedilol (COREG) 6.25 MG tablet Take 1 tablet (6.25 mg total) by mouth 2 (two) times daily with a meal.   celecoxib (CELEBREX) 200 MG capsule TAKE (1) CAPSULE TWICE DAILY.   citalopram (CELEXA) 20 MG tablet Take 1 tablet (20 mg total) by  mouth daily.   cyclobenzaprine (FLEXERIL) 10 MG tablet TAKE (1) TABLET THREE TIMES DAILY.   Dulaglutide (TRULICITY) 1.5 ZW/2.5EN SOPN Inject 1.5 mg into the skin once a week.   lisinopril-hydrochlorothiazide (ZESTORETIC) 20-25 MG tablet Take 1 tablet by mouth daily.   metFORMIN (GLUCOPHAGE) 500 MG tablet TAKE 1 TABLET TWICE DAILY WITH A MEAL. (Patient taking differently: daily with breakfast. TAKE 1 TABLET TWICE DAILY WITH A MEAL.)   Multiple Vitamin (MULTIVITAMIN) tablet Take 1 tablet by mouth daily.   nystatin cream (MYCOSTATIN) Apply 1 application topically 2 (two) times daily.   simvastatin (ZOCOR) 40 MG tablet Take 1 tablet (40 mg total) by mouth daily at 6 PM.   valACYclovir (VALTREX) 1000 MG tablet 2 tablets bid at fever blister onset.   zolpidem (AMBIEN) 10 MG tablet Take 1 tablet (10 mg total) by mouth at bedtime as needed for sleep.   No facility-administered encounter medications on file as of 06/16/2022.    Past Surgical History:  Procedure Laterality Date   CHOLECYSTECTOMY      Family History  Problem Relation Age of Onset   Diabetes Mother    Diabetes Father    Coronary artery disease Father    Cancer Paternal Grandmother    Breast cancer Neg Hx       Controlled substance contract: 09/08/21     Review of Systems  Constitutional:  Negative for diaphoresis.  Eyes:  Negative for pain.  Respiratory:  Negative for shortness of breath.   Cardiovascular:  Negative for chest pain, palpitations and leg swelling.  Gastrointestinal:  Negative for abdominal pain.  Endocrine: Negative for polydipsia.  Skin:  Negative for rash.  Neurological:  Negative for dizziness, weakness and headaches.  Hematological:  Does not bruise/bleed easily.  All other systems reviewed and are negative.      Objective:   Physical Exam Vitals and nursing note reviewed.  Constitutional:      General: She is not in acute distress.    Appearance: Normal appearance. She is well-developed.   HENT:     Head: Normocephalic.     Right Ear: Tympanic membrane normal.     Left Ear: Tympanic membrane normal.     Nose: Nose normal.     Mouth/Throat:     Mouth: Mucous membranes are moist.  Eyes:     Pupils: Pupils are equal, round, and reactive to light.  Neck:     Vascular: No carotid bruit or JVD.  Cardiovascular:  Rate and Rhythm: Normal rate and regular rhythm.     Heart sounds: Normal heart sounds.  Pulmonary:     Effort: Pulmonary effort is normal. No respiratory distress.     Breath sounds: Normal breath sounds. No wheezing or rales.  Chest:     Chest wall: No tenderness.  Abdominal:     General: Bowel sounds are normal. There is no distension or abdominal bruit.     Palpations: Abdomen is soft. There is no hepatomegaly, splenomegaly, mass or pulsatile mass.     Tenderness: There is no abdominal tenderness.  Musculoskeletal:        General: Normal range of motion.     Cervical back: Normal range of motion and neck supple.  Lymphadenopathy:     Cervical: No cervical adenopathy.  Skin:    General: Skin is warm and dry.  Neurological:     Mental Status: She is alert and oriented to person, place, and time.     Deep Tendon Reflexes: Reflexes are normal and symmetric.  Psychiatric:        Behavior: Behavior normal.        Thought Content: Thought content normal.        Judgment: Judgment normal.     BP 108/74   Pulse 87   Temp 98 F (36.7 C) (Temporal)   Resp 20   Ht '5\' 2"'  (1.575 m)   Wt 207 lb (93.9 kg)   SpO2 95%   BMI 37.86 kg/m   HGBA1c 6.7%     Assessment & Plan:  Ellen Delgado comes in today with chief complaint of Medical Management of Chronic Issues   Diagnosis and orders addressed:  1. Essential hypertension Lowsodium diet - CBC with Differential/Platelet - CMP14+EGFR - carvedilol (COREG) 6.25 MG tablet; Take 1 tablet (6.25 mg total) by mouth 2 (two) times daily with a meal.  Dispense: 180 tablet; Refill: 1 -  lisinopril-hydrochlorothiazide (ZESTORETIC) 20-25 MG tablet; Take 1 tablet by mouth daily.  Dispense: 90 tablet; Refill: 1  2. Pure hypercholesterolemia Low fat diet - Lipid panel - simvastatin (ZOCOR) 40 MG tablet; Take 1 tablet (40 mg total) by mouth daily at 6 PM.  Dispense: 90 tablet; Refill: 1  3. Type 2 diabetes mellitus without complication, without long-term current use of insulin (HCC) Continue to watch carbs in diet - Bayer DCA Hb A1c Waived - metFORMIN (GLUCOPHAGE) 500 MG tablet; TAKE 1 TABLET TWICE DAILY WITH A MEAL.  Dispense: 180 tablet; Refill: 1 - Dulaglutide (TRULICITY) 3 JX/9.1YN SOPN; Inject 3 mg as directed once a week.  Dispense: 2 mL; Refill: 3  4. Gastroesophageal reflux disease without esophagitis Avoid spicy foods Do not eat 2 hours prior to bedtime  5. Seizures ([place]) Report any seizure activity - carbamazepine (TEGRETOL) 200 MG tablet; TAKE  (1)  TABLET  FOUR TIMES DAILY.  Dispense: 120 tablet; Refill: 5  6. Depression, major, single episode, complete remission (HCC) Stress management - citalopram (CELEXA) 20 MG tablet; Take 1 tablet (20 mg total) by mouth daily.  Dispense: 90 tablet; Refill: 1  7. Panic attacks - busPIRone (BUSPAR) 15 MG tablet; Take 1 tablet (15 mg total) by mouth 2 (two) times daily.  Dispense: 60 tablet; Refill: 5  8. Primary insomnia Bedtime routine - ToxASSURE Select 13 (MW), Urine - Drug Screen 10 W/Conf, Se - zolpidem (AMBIEN) 10 MG tablet; Take 1 tablet (10 mg total) by mouth at bedtime as needed for sleep.  Dispense: 30 tablet; Refill: 5  9. DDD (degenerative disc disease), lumbar Moist heat rest - celecoxib (CELEBREX) 200 MG capsule; Take 1 capsule (200 mg total) by mouth 2 (two) times daily.  Dispense: 60 capsule; Refill: 1  10. BMI 26.0-26.9,adult Discussed diet and exercise for person with BMI >25 Will recheck weight in 3-6 months    Labs pending Health Maintenance reviewed Diet and exercise encouraged  Follow  up plan: 6 months   Mary-Margaret Hassell Done, FNP

## 2022-06-17 LAB — CMP14+EGFR
ALT: 20 IU/L (ref 0–32)
AST: 18 IU/L (ref 0–40)
Albumin/Globulin Ratio: 1.5 (ref 1.2–2.2)
Albumin: 4.3 g/dL (ref 3.8–4.9)
Alkaline Phosphatase: 78 IU/L (ref 44–121)
BUN/Creatinine Ratio: 29 — ABNORMAL HIGH (ref 9–23)
BUN: 27 mg/dL — ABNORMAL HIGH (ref 6–24)
Bilirubin Total: <0.2 mg/dL (ref 0.0–1.2)
CO2: 24 mmol/L (ref 20–29)
Calcium: 10.5 mg/dL — ABNORMAL HIGH (ref 8.7–10.2)
Chloride: 98 mmol/L (ref 96–106)
Creatinine, Ser: 0.94 mg/dL (ref 0.57–1.00)
Globulin, Total: 2.8 g/dL (ref 1.5–4.5)
Glucose: 106 mg/dL — ABNORMAL HIGH (ref 70–99)
Potassium: 4.4 mmol/L (ref 3.5–5.2)
Sodium: 138 mmol/L (ref 134–144)
Total Protein: 7.1 g/dL (ref 6.0–8.5)
eGFR: 71 mL/min/{1.73_m2} (ref >59)

## 2022-06-17 LAB — CBC WITH DIFFERENTIAL/PLATELET
Basophils Absolute: 0 10*3/uL (ref 0.0–0.2)
Basos: 1 %
EOS (ABSOLUTE): 0.3 10*3/uL (ref 0.0–0.4)
Eos: 4 %
Hematocrit: 41 % (ref 34.0–46.6)
Hemoglobin: 13.3 g/dL (ref 11.1–15.9)
Immature Grans (Abs): 0 10*3/uL (ref 0.0–0.1)
Immature Granulocytes: 0 %
Lymphocytes Absolute: 1.7 10*3/uL (ref 0.7–3.1)
Lymphs: 23 %
MCH: 30.7 pg (ref 26.6–33.0)
MCHC: 32.4 g/dL (ref 31.5–35.7)
MCV: 95 fL (ref 79–97)
Monocytes Absolute: 0.8 10*3/uL (ref 0.1–0.9)
Monocytes: 11 %
Neutrophils Absolute: 4.6 10*3/uL (ref 1.4–7.0)
Neutrophils: 61 %
Platelets: 282 10*3/uL (ref 150–450)
RBC: 4.33 x10E6/uL (ref 3.77–5.28)
RDW: 12.3 % (ref 11.7–15.4)
WBC: 7.5 10*3/uL (ref 3.4–10.8)

## 2022-06-17 LAB — LIPID PANEL
Chol/HDL Ratio: 3.2 ratio (ref 0.0–4.4)
Cholesterol, Total: 175 mg/dL (ref 100–199)
HDL: 55 mg/dL (ref >39)
LDL Chol Calc (NIH): 82 mg/dL (ref 0–99)
Triglycerides: 229 mg/dL — ABNORMAL HIGH (ref 0–149)
VLDL Cholesterol Cal: 38 mg/dL (ref 5–40)

## 2022-06-22 LAB — DRUG SCREEN 10 W/CONF, SERUM
Amphetamines, IA: NEGATIVE ng/mL
Barbiturates, IA: NEGATIVE ug/mL
Benzodiazepines, IA: NEGATIVE ng/mL
Cocaine & Metabolite, IA: NEGATIVE ng/mL
Methadone, IA: NEGATIVE ng/mL
Opiates, IA: NEGATIVE ng/mL
Oxycodones, IA: NEGATIVE ng/mL
Phencyclidine, IA: NEGATIVE ng/mL
Propoxyphene, IA: NEGATIVE ng/mL
THC(Marijuana) Metabolite, IA: NEGATIVE ng/mL

## 2022-06-29 ENCOUNTER — Other Ambulatory Visit: Payer: Self-pay | Admitting: Nurse Practitioner

## 2022-06-29 DIAGNOSIS — M5136 Other intervertebral disc degeneration, lumbar region: Secondary | ICD-10-CM

## 2022-06-29 NOTE — Telephone Encounter (Signed)
Last office visit 06/16/22 Last refill 05/16/22, #60, no refills

## 2022-07-11 ENCOUNTER — Ambulatory Visit: Payer: Medicaid Other | Admitting: Pharmacist

## 2022-07-11 ENCOUNTER — Telehealth: Payer: Self-pay | Admitting: Nurse Practitioner

## 2022-07-11 NOTE — Telephone Encounter (Signed)
Patient aware to keep taking her meds until appt

## 2022-07-22 ENCOUNTER — Other Ambulatory Visit: Payer: Self-pay | Admitting: Nurse Practitioner

## 2022-07-22 DIAGNOSIS — E119 Type 2 diabetes mellitus without complications: Secondary | ICD-10-CM

## 2022-07-25 ENCOUNTER — Ambulatory Visit (INDEPENDENT_AMBULATORY_CARE_PROVIDER_SITE_OTHER): Payer: Medicaid Other | Admitting: Pharmacist

## 2022-07-25 DIAGNOSIS — E119 Type 2 diabetes mellitus without complications: Secondary | ICD-10-CM | POA: Diagnosis not present

## 2022-07-25 MED ORDER — TIRZEPATIDE 5 MG/0.5ML ~~LOC~~ SOAJ: 5.0000 mg | SUBCUTANEOUS | 0 refills | Status: DC

## 2022-07-25 NOTE — Progress Notes (Signed)
      07/25/2022 Name: Ellen Delgado          MRN: [id number]       DOB: [birth year]    S:  56 yoF Presents for follow up diabetes evaluation, education, and management.  Patient was referred and last seen by Primary Care Provider on 06/16/22.  She has been tolerating trulicity well and denies side effects.  She has been working on her diet and lifestyle changes. She reports about a 20lb weight loss.  A1c has also improved with addition of trulicity.    Insurance coverage/medication affordability: medicaid   Patient reports adherence with medications. Current diabetes medications include: metformin, trulicity Current hypertension medications include: carvedilol, lisinopril/hctz Goal 130/80 Current hyperlipidemia medications include: simvastatin   Patient denies hypoglycemic events.   Patient reported dietary habits: Eats 3 meals/day   Breakfast:unsure   Lunch:unsure   Dinner: sweet potatoes, green bean/gb casserole   Snacks: greek yogurt light   Drinks: natural twist (no sugar), diet sun drops   Patient-reported exercise habits: unable   O:  Lab Results  Component Value Date   HGBA1C 6.7 (H) 06/16/2022   Lipid Panel     Component Value Date/Time   CHOL 175 06/16/2022 1056   CHOL 204 (H) 04/28/2013 0920   TRIG 229 (H) 06/16/2022 1056   TRIG 310 (H) 03/18/2015 1046   TRIG 348 (H) 04/28/2013 0920   HDL 55 06/16/2022 1056   HDL 47 03/18/2015 1046   HDL 42 04/28/2013 0920   CHOLHDL 3.2 06/16/2022 1056   LDLCALC 82 06/16/2022 1056   LDLCALC 81 07/29/2014 0913   LDLCALC 92 04/28/2013 0920     Home fasting blood sugars: <130   2 hour post-meal/random blood sugars: <180.     Clinical Atherosclerotic Cardiovascular Disease (ASCVD): No    The 10-year ASCVD risk score (Arnett DK, et al., 2019) is: 4.1%   Values used to calculate the score:     Age: 56 years     Sex: Female     Is Non-Hispanic African American: No     Diabetic: Yes     Tobacco smoker: No      Systolic Blood Pressure: 118 mmHg     Is BP treated: Yes     HDL Cholesterol: 55 mg/dL     Total Cholesterol: 171 mg/dL     A/P:   Diabetes Z6WF currently CONTROLLED. Patient is adherent with medication.    -Continue metformin   -INCREASE GLP-1 (Trulicity) 4.5MG  WEEKLY--patient would like additional weight loss (max dose)             Patient tolerating well             Denies personal and family history of Medullary thyroid cancer (MTC)             may d/c metformin   -Mounjaro denied by insurance; patient must try/fail ozempic and trulicity; will continue current therapy   -Extensively discussed pathophysiology of diabetes, recommended lifestyle interventions, dietary effects on blood sugar control   -Counseled on s/sx of and management of hypoglycemia     Written patient instructions provided.  Total time in face to face counseling 25 minutes.    Follow up Pharmacist/PCP Clinic Visit IN 1 MONTH   Kieth Brightly, PharmD, BCPS Clinical Pharmacist, [facility]  II Phone [phone]

## 2022-07-28 ENCOUNTER — Telehealth: Payer: Self-pay

## 2022-07-28 NOTE — Telephone Encounter (Signed)
Patient aware and verbalized understanding. States she would like for Ellen Delgado to try something else for her. Aware julie not back till next week

## 2022-07-28 NOTE — Telephone Encounter (Signed)
Let patient know insurance will not cover mounjario

## 2022-07-28 NOTE — Telephone Encounter (Signed)
No PA call was started when PA was started.     Outcome Denied on August 15 Request Reference Number: [id number]. MOUNJARO INJ 5MG /0.5 is denied for not meeting the prior authorization requirement(s). For further questions, call [employer] at [phone] for more information.

## 2022-07-31 ENCOUNTER — Other Ambulatory Visit: Payer: Self-pay | Admitting: Nurse Practitioner

## 2022-07-31 DIAGNOSIS — M5136 Other intervertebral disc degeneration, lumbar region: Secondary | ICD-10-CM

## 2022-07-31 NOTE — Telephone Encounter (Signed)
Last office visit 06/16/22 Last refill 06/29/22, #60, no refills

## 2022-08-01 ENCOUNTER — Telehealth: Payer: Self-pay | Admitting: Nurse Practitioner

## 2022-08-01 DIAGNOSIS — E119 Type 2 diabetes mellitus without complications: Secondary | ICD-10-CM

## 2022-08-01 NOTE — Telephone Encounter (Signed)
Resubmitted PA for [facility] PharmD to follow

## 2022-08-04 MED ORDER — TRULICITY 4.5 MG/0.5ML ~~LOC~~ SOAJ: 4.5000 mg | SUBCUTANEOUS | 3 refills | Status: DC

## 2022-08-04 NOTE — Telephone Encounter (Signed)
Called patient  Escalating to trulicity 4.5mg  after 3mg  box finished

## 2022-08-25 ENCOUNTER — Other Ambulatory Visit: Payer: Self-pay | Admitting: Nurse Practitioner

## 2022-08-25 DIAGNOSIS — M5136 Other intervertebral disc degeneration, lumbar region: Secondary | ICD-10-CM

## 2022-09-01 ENCOUNTER — Ambulatory Visit (INDEPENDENT_AMBULATORY_CARE_PROVIDER_SITE_OTHER): Payer: Medicaid Other | Admitting: Pharmacist

## 2022-09-01 DIAGNOSIS — I10 Essential (primary) hypertension: Secondary | ICD-10-CM | POA: Diagnosis not present

## 2022-09-01 DIAGNOSIS — E119 Type 2 diabetes mellitus without complications: Secondary | ICD-10-CM | POA: Diagnosis not present

## 2022-09-05 NOTE — Progress Notes (Signed)
09/01/2022 Name: Ellen Delgado          MRN: [id number]       DOB: 11/07/1966    S:  56 yoF Presents for follow up diabetes evaluation, education, and management.  Patient was referred and last seen by Primary Care Provider on 06/16/22.  She has been tolerating trulicity well and denies side effects.  She continues to work on her diet and lifestyle changes. She reports about a 20lb weight loss.  A1c has also improved with addition of trulicity.    Insurance coverage/medication affordability: medicaid   Patient reports adherence with medications. Current diabetes medications include: metformin, trulicity Current hypertension medications include: carvedilol, lisinopril/hctz Goal 130/80 Current hyperlipidemia medications include: simvastatin   Patient denies hypoglycemic events.   Patient reported dietary habits: Eats 3 meals/day   Breakfast:unsure   Lunch:unsure   Dinner: sweet potatoes, green bean/gb casserole   Snacks: greek yogurt light   Drinks: natural twist (no sugar), diet sun drops   Patient-reported exercise habits: unable due to hip pain   O:   Recent Labs       Lab Results  Component Value Date    HGBA1C 6.7 (H) 06/16/2022      Lipid Panel   Labs (Brief)          Component Value Date/Time    CHOL 175 06/16/2022 1056    CHOL 204 (H) 04/28/2013 0920    TRIG 229 (H) 06/16/2022 1056    TRIG 310 (H) 03/18/2015 1046    TRIG 348 (H) 04/28/2013 0920    HDL 55 06/16/2022 1056    HDL 47 03/18/2015 1046    HDL 42 04/28/2013 0920    CHOLHDL 3.2 06/16/2022 1056    LDLCALC 82 06/16/2022 1056    LDLCALC 81 07/29/2014 0913    LDLCALC 92 04/28/2013 0920       Home fasting blood sugars: <130   2 hour post-meal/random blood sugars: <180.     Clinical Atherosclerotic Cardiovascular Disease (ASCVD): No    The 10-year ASCVD risk score (Arnett DK, et al., 2019) is: 4.1%   Values used to calculate the score:     Age: 56 years     Sex: Female     Is  Non-Hispanic African American: No     Diabetic: Yes     Tobacco smoker: No     Systolic Blood Pressure: 118 mmHg     Is BP treated: Yes     HDL Cholesterol: 55 mg/dL     Total Cholesterol: 171 mg/dL     A/P:   Diabetes T6RW currently CONTROLLED. Patient is adherent with medication.    -Continue metformin   -INCREASE GLP-1 (Trulicity) 4.5MG  WEEKLY--patient would like additional weight loss (max dose)             Patient tolerating well             Denies personal and family history of Medullary thyroid cancer (MTC)             may d/c metformin   -Mounjaro denied by insurance; patient must try/fail ozempic and trulicity; will continue current therapy   -Extensively discussed pathophysiology of diabetes, recommended lifestyle interventions, dietary effects on blood sugar control   -Counseled on s/sx of and management of hypoglycemia     Written patient instructions provided.  Total time in face to face counseling 25 minutes.    Follow up Pharmacist/PCP Clinic Visit IN 1 MONTH   Raynelle Fanning  Reyes Ivan, PharmD, BCPS Clinical Pharmacist, [place]  II Phone [phone]

## 2022-09-20 ENCOUNTER — Other Ambulatory Visit: Payer: Self-pay | Admitting: Nurse Practitioner

## 2022-09-20 DIAGNOSIS — F410 Panic disorder [episodic paroxysmal anxiety] without agoraphobia: Secondary | ICD-10-CM

## 2022-09-20 DIAGNOSIS — E119 Type 2 diabetes mellitus without complications: Secondary | ICD-10-CM

## 2022-09-28 ENCOUNTER — Other Ambulatory Visit: Payer: Self-pay | Admitting: Nurse Practitioner

## 2022-09-28 DIAGNOSIS — M5136 Other intervertebral disc degeneration, lumbar region: Secondary | ICD-10-CM

## 2022-10-13 ENCOUNTER — Ambulatory Visit: Payer: Medicaid Other | Admitting: Pharmacist

## 2022-10-13 DIAGNOSIS — E119 Type 2 diabetes mellitus without complications: Secondary | ICD-10-CM

## 2022-10-19 NOTE — Progress Notes (Signed)
  PharmD  Follow Up Note   10/19/2022 Name: Ellen Delgado MRN: [id number] DOB: [birth year]   Referred by: Bennie Pierini, FNP Reason for referral : Chronic Care Management and Diabetes  Discussed Mounjaro for increased glycemic control and diabetes management.  Patient must try and fail Ozempic before it's covered. Will schedule patient for 10/27/22 to discuss Ozempic transition.    Kieth Brightly, PharmD, BCPS Clinical Pharmacist, [facility]  II Phone [phone]

## 2022-10-23 ENCOUNTER — Other Ambulatory Visit: Payer: Self-pay | Admitting: Nurse Practitioner

## 2022-10-23 DIAGNOSIS — M5136 Other intervertebral disc degeneration, lumbar region: Secondary | ICD-10-CM

## 2022-10-27 ENCOUNTER — Ambulatory Visit (INDEPENDENT_AMBULATORY_CARE_PROVIDER_SITE_OTHER): Payer: Medicaid Other | Admitting: Pharmacist

## 2022-10-27 VITALS — Wt 203.0 lb

## 2022-10-27 DIAGNOSIS — E119 Type 2 diabetes mellitus without complications: Secondary | ICD-10-CM

## 2022-10-27 NOTE — Progress Notes (Signed)
09/01/2022 Name: Ellen Delgado          MRN: [id number]       DOB: [birth year]    S:  65 yoF Presents for follow up diabetes evaluation, education, and management.  Patient was referred and last seen by Primary Care Provider on 06/16/22.  She has been tolerating trulicity well and denies side effects.  She continues to work on her diet and lifestyle changes. She reports about a 20lb weight loss.  A1c has also improved with addition of trulicity.    Insurance coverage/medication affordability: medicaid   Patient reports adherence with medications. Current diabetes medications include: metformin, trulicity-->ozempic Current hypertension medications include: carvedilol, lisinopril/hctz Goal 130/80 Current hyperlipidemia medications include: simvastatin   Patient denies hypoglycemic events.   Patient reported dietary habits: Eats 3 meals/day   Breakfast:unsure   Lunch:unsure   Dinner: sweet potatoes, green bean/gb casserole   Snacks: greek yogurt light   Drinks: natural twist (no sugar), diet sun drops   Patient-reported exercise habits: unable due to hip pain   O:   Recent Labs           Lab Results  Component Value Date    HGBA1C 6.7 (H) 06/16/2022      Lipid Panel   Labs (Brief)              Component Value Date/Time    CHOL 175 06/16/2022 1056    CHOL 204 (H) 04/28/2013 0920    TRIG 229 (H) 06/16/2022 1056    TRIG 310 (H) 03/18/2015 1046    TRIG 348 (H) 04/28/2013 0920    HDL 55 06/16/2022 1056    HDL 47 03/18/2015 1046    HDL 42 04/28/2013 0920    CHOLHDL 3.2 06/16/2022 1056    LDLCALC 82 06/16/2022 1056    LDLCALC 81 07/29/2014 0913    LDLCALC 92 04/28/2013 0920       Home fasting blood sugars: <130   2 hour post-meal/random blood sugars: <180.     Clinical Atherosclerotic Cardiovascular Disease (ASCVD): No    The 10-year ASCVD risk score (Arnett DK, et al., 2019) is: 4.1%   Values used to calculate the score:     Age: 54 years     Sex: Female      Is Non-Hispanic African American: No     Diabetic: Yes     Tobacco smoker: No     Systolic Blood Pressure: 123456 mmHg     Is BP treated: Yes     HDL Cholesterol: 55 mg/dL     Total Cholesterol: 171 mg/dL     A/P:   Diabetes T2DM currently CONTROLLED. Patient is adherent with medication.    -Continue metformin   -transition to ozempic 1mg  sq weekyl sample to see if she tolerates medication--patient would like additional weight loss (max dose); we can then try Mounjaro thereafter per insurance requirements             Patient tolerating well             Denies personal and family history of Medullary thyroid cancer ([place])             may d/c metformin    -Mounjaro denied by insurance; patient must try/fail ozempic and trulicity; will continue current therapy as above   -Extensively discussed pathophysiology of diabetes, recommended lifestyle interventions, dietary effects on blood sugar control   -Counseled on s/sx of and management of hypoglycemia  Written patient instructions provided.  Total time in face to face counseling 25 minutes.    Follow up Pharmacist/PCP Clinic Visit IN 1 MONTH   Kieth Brightly, PharmD, BCPS Clinical Pharmacist, [facility]  II Phone [phone]

## 2022-11-09 ENCOUNTER — Telehealth: Payer: Self-pay | Admitting: Nurse Practitioner

## 2022-11-09 NOTE — Telephone Encounter (Signed)
Calling to check on [facility] approval. Please call back

## 2022-11-09 NOTE — Telephone Encounter (Signed)
PA for mounjaro in process Will follow up

## 2022-11-09 NOTE — Telephone Encounter (Signed)
Please advise 

## 2022-11-09 NOTE — Telephone Encounter (Signed)
[place]on (Key: [id number]) - [id number] 5MG/0.5ML pen-injectors Status: PA Response - Denied  Here are the policy requirements your request did not meet:  Per your health plan's criteria, this drug is covered if you meet the following:  You have tried or cannot use one preferred drug: Byetta, Ozempic or Victoza.  The facts given does not show you have met these requirements.

## 2022-11-14 ENCOUNTER — Other Ambulatory Visit: Payer: Self-pay | Admitting: Nurse Practitioner

## 2022-11-14 DIAGNOSIS — M5136 Other intervertebral disc degeneration, lumbar region: Secondary | ICD-10-CM

## 2022-11-14 MED ORDER — TRULICITY 4.5 MG/0.5ML ~~LOC~~ SOAJ: 4.5000 mg | SUBCUTANEOUS | 5 refills | Status: DC

## 2022-11-14 NOTE — Addendum Note (Signed)
Addended by: Vanice Sarah D on: 11/14/2022 10:32 AM   Modules accepted: Orders

## 2022-11-14 NOTE — Telephone Encounter (Signed)
Call returned to patient She will finish out ozempic 1mg  weekly sample (backordered for 1mg /2mg ) Will then transition back to trulicity 4.5mg  weekly

## 2022-12-02 ENCOUNTER — Other Ambulatory Visit: Payer: Self-pay | Admitting: Nurse Practitioner

## 2022-12-02 DIAGNOSIS — E119 Type 2 diabetes mellitus without complications: Secondary | ICD-10-CM

## 2022-12-06 ENCOUNTER — Other Ambulatory Visit: Payer: Self-pay | Admitting: Nurse Practitioner

## 2022-12-06 DIAGNOSIS — Z1231 Encounter for screening mammogram for malignant neoplasm of breast: Secondary | ICD-10-CM

## 2022-12-14 ENCOUNTER — Other Ambulatory Visit: Payer: Self-pay | Admitting: Nurse Practitioner

## 2022-12-14 DIAGNOSIS — F410 Panic disorder [episodic paroxysmal anxiety] without agoraphobia: Secondary | ICD-10-CM

## 2022-12-14 DIAGNOSIS — M5136 Other intervertebral disc degeneration, lumbar region: Secondary | ICD-10-CM

## 2022-12-18 ENCOUNTER — Ambulatory Visit
Admission: RE | Admit: 2022-12-18 | Discharge: 2022-12-18 | Disposition: A | Discharge status: Home / Self Care | Payer: Medicaid Other | Source: Ambulatory Visit | Attending: Nurse Practitioner | Admitting: Nurse Practitioner

## 2022-12-18 DIAGNOSIS — Z1231 Encounter for screening mammogram for malignant neoplasm of breast: Secondary | ICD-10-CM

## 2022-12-22 ENCOUNTER — Other Ambulatory Visit: Payer: Self-pay | Admitting: Nurse Practitioner

## 2022-12-22 DIAGNOSIS — F5101 Primary insomnia: Secondary | ICD-10-CM

## 2022-12-22 DIAGNOSIS — M5136 Other intervertebral disc degeneration, lumbar region: Secondary | ICD-10-CM

## 2022-12-22 NOTE — Telephone Encounter (Signed)
Last office visit 7/7/;23 Upcoming appointment 12/29/22 Zolpidem last refill 06/16/22, #30, 5 Cyclobenzaprine last refill 11/15/22, #60, no refills

## 2022-12-29 ENCOUNTER — Ambulatory Visit (INDEPENDENT_AMBULATORY_CARE_PROVIDER_SITE_OTHER): Payer: Medicaid Other | Admitting: Nurse Practitioner

## 2022-12-29 ENCOUNTER — Encounter: Payer: Self-pay | Admitting: Nurse Practitioner

## 2022-12-29 VITALS — BP 106/65 | HR 72 | Temp 96.9°F | Resp 20 | Ht 62.0 in | Wt 207.0 lb

## 2022-12-29 DIAGNOSIS — E119 Type 2 diabetes mellitus without complications: Secondary | ICD-10-CM | POA: Diagnosis not present

## 2022-12-29 DIAGNOSIS — E7800 Pure hypercholesterolemia, unspecified: Secondary | ICD-10-CM

## 2022-12-29 DIAGNOSIS — F325 Major depressive disorder, single episode, in full remission: Secondary | ICD-10-CM | POA: Diagnosis not present

## 2022-12-29 DIAGNOSIS — R569 Unspecified convulsions: Secondary | ICD-10-CM | POA: Diagnosis not present

## 2022-12-29 DIAGNOSIS — F5101 Primary insomnia: Secondary | ICD-10-CM

## 2022-12-29 DIAGNOSIS — Z6826 Body mass index (BMI) 26.0-26.9, adult: Secondary | ICD-10-CM

## 2022-12-29 DIAGNOSIS — I10 Essential (primary) hypertension: Secondary | ICD-10-CM | POA: Diagnosis not present

## 2022-12-29 DIAGNOSIS — F410 Panic disorder [episodic paroxysmal anxiety] without agoraphobia: Secondary | ICD-10-CM

## 2022-12-29 DIAGNOSIS — K219 Gastro-esophageal reflux disease without esophagitis: Secondary | ICD-10-CM | POA: Diagnosis not present

## 2022-12-29 DIAGNOSIS — M5136 Other intervertebral disc degeneration, lumbar region: Secondary | ICD-10-CM | POA: Diagnosis not present

## 2022-12-29 LAB — CBC WITH DIFFERENTIAL/PLATELET
Basophils Absolute: 0 10*3/uL (ref 0.0–0.2)
Basos: 1 %
EOS (ABSOLUTE): 0.3 10*3/uL (ref 0.0–0.4)
Eos: 5 %
Hematocrit: 41 % (ref 34.0–46.6)
Hemoglobin: 14.2 g/dL (ref 11.1–15.9)
Immature Grans (Abs): 0 10*3/uL (ref 0.0–0.1)
Immature Granulocytes: 0 %
Lymphocytes Absolute: 1.4 10*3/uL (ref 0.7–3.1)
Lymphs: 25 %
MCH: 32.1 pg (ref 26.6–33.0)
MCHC: 34.6 g/dL (ref 31.5–35.7)
MCV: 93 fL (ref 79–97)
Monocytes Absolute: 0.6 10*3/uL (ref 0.1–0.9)
Monocytes: 11 %
Neutrophils Absolute: 3.4 10*3/uL (ref 1.4–7.0)
Neutrophils: 58 %
Platelets: 255 10*3/uL (ref 150–450)
RBC: 4.43 x10E6/uL (ref 3.77–5.28)
RDW: 11.8 % (ref 11.7–15.4)
WBC: 5.7 10*3/uL (ref 3.4–10.8)

## 2022-12-29 LAB — LIPID PANEL
Chol/HDL Ratio: 2.3 ratio (ref 0.0–4.4)
Cholesterol, Total: 153 mg/dL (ref 100–199)
HDL: 66 mg/dL (ref >39)
LDL Chol Calc (NIH): 62 mg/dL (ref 0–99)
Triglycerides: 149 mg/dL (ref 0–149)
VLDL Cholesterol Cal: 25 mg/dL (ref 5–40)

## 2022-12-29 LAB — CMP14+EGFR
ALT: 22 IU/L (ref 0–32)
AST: 25 IU/L (ref 0–40)
Albumin/Globulin Ratio: 1.5 (ref 1.2–2.2)
Albumin: 4.3 g/dL (ref 3.8–4.9)
Alkaline Phosphatase: 90 IU/L (ref 44–121)
BUN/Creatinine Ratio: 19 (ref 9–23)
BUN: 18 mg/dL (ref 6–24)
Bilirubin Total: 0.2 mg/dL (ref 0.0–1.2)
CO2: 28 mmol/L (ref 20–29)
Calcium: 10.1 mg/dL (ref 8.7–10.2)
Chloride: 95 mmol/L — ABNORMAL LOW (ref 96–106)
Creatinine, Ser: 0.96 mg/dL (ref 0.57–1.00)
Globulin, Total: 2.9 g/dL (ref 1.5–4.5)
Glucose: 128 mg/dL — ABNORMAL HIGH (ref 70–99)
Potassium: 4.3 mmol/L (ref 3.5–5.2)
Sodium: 136 mmol/L (ref 134–144)
Total Protein: 7.2 g/dL (ref 6.0–8.5)
eGFR: 69 mL/min/{1.73_m2} (ref >59)

## 2022-12-29 LAB — BAYER DCA HB A1C WAIVED: HB A1C (BAYER DCA - WAIVED): 6.5 % — ABNORMAL HIGH (ref 4.8–5.6)

## 2022-12-29 MED ORDER — SIMVASTATIN 40 MG PO TABS: 40.0000 mg | ORAL_TABLET | Freq: Every day | ORAL | 1 refills | Status: DC

## 2022-12-29 MED ORDER — CITALOPRAM HYDROBROMIDE 20 MG PO TABS: 20.0000 mg | ORAL_TABLET | Freq: Every day | ORAL | 1 refills | Status: DC

## 2022-12-29 MED ORDER — OZEMPIC (0.25 OR 0.5 MG/DOSE) 2 MG/3ML ~~LOC~~ SOPN: 0.2500 mg | PEN_INJECTOR | SUBCUTANEOUS | 2 refills | Status: DC

## 2022-12-29 MED ORDER — ZOLPIDEM TARTRATE 10 MG PO TABS: ORAL_TABLET | ORAL | 5 refills | Status: DC

## 2022-12-29 MED ORDER — LISINOPRIL-HYDROCHLOROTHIAZIDE 20-25 MG PO TABS: 1.0000 | ORAL_TABLET | Freq: Every day | ORAL | 1 refills | Status: DC

## 2022-12-29 MED ORDER — CELECOXIB 200 MG PO CAPS: 200.0000 mg | ORAL_CAPSULE | Freq: Two times a day (BID) | ORAL | 5 refills | Status: DC

## 2022-12-29 MED ORDER — BUSPIRONE HCL 15 MG PO TABS: 15.0000 mg | ORAL_TABLET | Freq: Two times a day (BID) | ORAL | 5 refills | Status: DC

## 2022-12-29 MED ORDER — CARVEDILOL 6.25 MG PO TABS: 6.2500 mg | ORAL_TABLET | Freq: Two times a day (BID) | ORAL | 1 refills | Status: DC

## 2022-12-29 NOTE — Progress Notes (Signed)
Subjective:    Patient ID: Ellen Delgado, female    DOB: [birth year], 57 y.o.   MRN: [id number]  Chief Complaint: medical management of chronic issues     HPI:  Ellen Delgado is a 57 y.o. who identifies as a female who was assigned female at birth.   Social history: Lives with: boyfriend Work history: disability   Comes in today for follow up of the following chronic medical issues:  1. Essential hypertension No c/o chest pain, sob or headache. Does not check blood pressure at home.  BP Readings from Last 3 Encounters:  06/16/22 108/74  03/10/22 133/86  09/02/21 135/84     2. Pure hypercholesterolemia Does not watch diet and does no exercise. Lab Results  Component Value Date   CHOL 175 06/16/2022   HDL 55 06/16/2022   LDLCALC 82 06/16/2022   TRIG 229 (H) 06/16/2022   CHOLHDL 3.2 06/16/2022     3. Type 2 diabetes mellitus without complication, without long-term current use of insulin (HCC) She does not check her blood sugars at home. Denies any low blood sugars. Lab Results  Component Value Date   HGBA1C 6.7 (H) 06/16/2022     4. Gastroesophageal reflux disease without esophagitis Is on no prescription meds with no issues  5. Seizures ([place]) Denies any recent seizure activity. Is still on tegretol.  6. Depression, major, single episode, complete remission ([place]) Is on celexa and is doing well.    12/29/2022   10:15 AM 06/16/2022   11:05 AM 03/10/2022   10:34 AM  Depression screen PHQ 2/9  Decreased Interest 0 0 0  Down, Depressed, Hopeless 0 0 0  PHQ - 2 Score 0 0 0  Altered sleeping 0 0 0  Tired, decreased energy 0 0 3  Change in appetite 0 0 0  Feeling bad or failure about yourself  0 0 0  Trouble concentrating 0 0 0  Moving slowly or fidgety/restless 0 0 0  Suicidal thoughts 0 0 0  PHQ-9 Score 0 0 3  Difficult doing work/chores Not difficult at all Not difficult at all Very difficult     7. Panic attacks Is on buspar and that helps a lot  with her anxiety.    12/29/2022   10:15 AM 06/16/2022   11:05 AM 03/10/2022   10:34 AM 09/02/2021    9:58 AM  GAD 7 : Generalized Anxiety Score  Nervous, Anxious, on Edge 0 0 0 0  Control/stop worrying 0 0 1 0  Worry too much - different things 0 0 1 0  Trouble relaxing 0 0 0 0  Restless 0 0 0 0  Easily annoyed or irritable 0 0 0 0  Afraid - awful might happen 0 0 0 0  Total GAD 7 Score 0 0 2 0  Anxiety Difficulty Not difficult at all Not difficult at all Not difficult at all Not difficult at all      8. Primary insomnia Is on ambien to sleep and sleeps about 7-8 hours  9. DDD (degenerative disc disease), lumbar Has chronic back pain- currently not on any pain medication. Takes celebrex which helps  10. BMI 26.0-26.9,adult Weight is up 4 lbs Wt Readings from Last 3 Encounters:  12/29/22 207 lb (93.9 kg)  10/27/22 203 lb (92.1 kg)  06/16/22 207 lb (93.9 kg)   BMI Readings from Last 3 Encounters:  12/29/22 37.86 kg/m  10/27/22 37.13 kg/m  06/16/22 37.86 kg/m      New  complaints: None today  Allergies  Allergen Reactions   Tdap [Tetanus-Diphth-Acell Pertussis] Shortness Of Breath    sob   Outpatient Encounter Medications as of 12/29/2022  Medication Sig   blood glucose meter kit and supplies KIT Checks blood sugars at home fating in morning and PRN   busPIRone (BUSPAR) 15 MG tablet TAKE ONE TABLET TWICE A DAY.   calcium-vitamin D (OSCAL WITH D) 500-200 MG-UNIT tablet Take 3 tablets by mouth daily.   carbamazepine (TEGRETOL) 200 MG tablet TAKE  (1)  TABLET  FOUR TIMES DAILY.   carvedilol (COREG) 6.25 MG tablet Take 1 tablet (6.25 mg total) by mouth 2 (two) times daily with a meal.   celecoxib (CELEBREX) 200 MG capsule TAKE ONE CAPSULE TWICE DAILY   citalopram (CELEXA) 20 MG tablet Take 1 tablet (20 mg total) by mouth daily.   cyclobenzaprine (FLEXERIL) 10 MG tablet TAKE (1) TABLET THREE TIMES DAILY.   Dulaglutide (TRULICITY) 4.5 EP/3.2RJ SOPN Inject 4.5 mg as  directed once a week.   lisinopril-hydrochlorothiazide (ZESTORETIC) 20-25 MG tablet Take 1 tablet by mouth daily.   metFORMIN (GLUCOPHAGE) 500 MG tablet TAKE 1 TABLET TWICE DAILY WITH A MEAL.   Multiple Vitamin (MULTIVITAMIN) tablet Take 1 tablet by mouth daily.   simvastatin (ZOCOR) 40 MG tablet Take 1 tablet (40 mg total) by mouth daily at 6 PM.   valACYclovir (VALTREX) 1000 MG tablet 2 tablets bid at fever blister onset.   zolpidem (AMBIEN) 10 MG tablet TAKE ONE TABLET at bedtime AS NEEDED FOR SLEEP   No facility-administered encounter medications on file as of 12/29/2022.    Past Surgical History:  Procedure Laterality Date   CHOLECYSTECTOMY      Family History  Problem Relation Age of Onset   Diabetes Mother    Diabetes Father    Coronary artery disease Father    Cancer Paternal Grandmother    Breast cancer Neg Hx       Controlled substance contract: n/a     Review of Systems  Constitutional:  Negative for diaphoresis.  Eyes:  Negative for pain.  Respiratory:  Negative for shortness of breath.   Cardiovascular:  Negative for chest pain, palpitations and leg swelling.  Gastrointestinal:  Negative for abdominal pain.  Endocrine: Negative for polydipsia.  Skin:  Negative for rash.  Neurological:  Negative for dizziness, weakness and headaches.  Hematological:  Does not bruise/bleed easily.  All other systems reviewed and are negative.      Objective:   Physical Exam Vitals and nursing note reviewed.  Constitutional:      General: She is not in acute distress.    Appearance: Normal appearance. She is well-developed.  HENT:     Head: Normocephalic.     Right Ear: Tympanic membrane normal.     Left Ear: Tympanic membrane normal.     Nose: Nose normal.     Mouth/Throat:     Mouth: Mucous membranes are moist.  Eyes:     Pupils: Pupils are equal, round, and reactive to light.  Neck:     Vascular: No carotid bruit or JVD.  Cardiovascular:     Rate and Rhythm:  Normal rate and regular rhythm.     Heart sounds: Normal heart sounds.  Pulmonary:     Effort: Pulmonary effort is normal. No respiratory distress.     Breath sounds: Normal breath sounds. No wheezing or rales.  Chest:     Chest wall: No tenderness.  Abdominal:     General:  Bowel sounds are normal. There is no distension or abdominal bruit.     Palpations: Abdomen is soft. There is no hepatomegaly, splenomegaly, mass or pulsatile mass.     Tenderness: There is no abdominal tenderness.  Musculoskeletal:        General: Normal range of motion.     Cervical back: Normal range of motion and neck supple.  Lymphadenopathy:     Cervical: No cervical adenopathy.  Skin:    General: Skin is warm and dry.  Neurological:     Mental Status: She is alert and oriented to person, place, and time.     Deep Tendon Reflexes: Reflexes are normal and symmetric.  Psychiatric:        Behavior: Behavior normal.        Thought Content: Thought content normal.        Judgment: Judgment normal.     BP 106/65   Pulse 72   Temp (!) 96.9 F (36.1 C) (Temporal)   Resp 20   Ht 5\' 2"  (1.575 m)   Wt 207 lb (93.9 kg)   SpO2 99%   BMI 37.86 kg/m   Hgba1c 6.5%     Assessment & Plan:   Ellen Delgado comes in today with chief complaint of Medical Management of Chronic Issues   Diagnosis and orders addressed:  1. Essential hypertension Low sodium diet - CBC with Differential/Platelet - CMP14+EGFR - carvedilol (COREG) 6.25 MG tablet; Take 1 tablet (6.25 mg total) by mouth 2 (two) times daily with a meal.  Dispense: 180 tablet; Refill: 1 - lisinopril-hydrochlorothiazide (ZESTORETIC) 20-25 MG tablet; Take 1 tablet by mouth daily.  Dispense: 90 tablet; Refill: 1  2. Pure hypercholesterolemia  - [place] fat diet pid panel - simvastatin (ZOCOR) 40 MG tablet; Take 1 tablet (40 mg total) by mouth daily at 6 PM.  Dispense: 90 tablet; Refill: 1  3. Type 2 diabetes mellitus without complication, without  long-term current use of insulin (HCC) Changed from trulicity to ozempic - Bayer DCA Hb A1c Waived - Semaglutide,0.25 or 0.5MG /DOS, (OZEMPIC, 0.25 OR 0.5 MG/DOSE,) 2 MG/3ML SOPN; Inject 0.25 mg into the skin once a week.  Dispense: 3 mL; Refill: 2  4. Gastroesophageal reflux disease without esophagitis Avoid spicy foods Do not eat 2 hours prior to bedtime   5. Seizures (HCC) Continue meds  6. Depression, major, single episode, complete remission (HCC) Stress management - citalopram (CELEXA) 20 MG tablet; Take 1 tablet (20 mg total) by mouth daily.  Dispense: 90 tablet; Refill: 1  7. Panic attacks - busPIRone (BUSPAR) 15 MG tablet; Take 1 tablet (15 mg total) by mouth 2 (two) times daily.  Dispense: 60 tablet; Refill: 5  8. Primary insomnia Bedtime routine - zolpidem (AMBIEN) 10 MG tablet; TAKE ONE TABLET at bedtime AS NEEDED FOR SLEEP  Dispense: 30 tablet; Refill: 5  9. DDD (degenerative disc disease), lumbar Moist heat' rest - celecoxib (CELEBREX) 200 MG capsule; Take 1 capsule (200 mg total) by mouth 2 (two) times daily.  Dispense: 60 capsule; Refill: 5  10. BMI 26.0-26.9,adult Discussed diet and exercise for person with BMI >25 Will recheck weight in 3-6 months    Labs pending Health Maintenance reviewed Diet and exercise encouraged  Follow up plan: 3 months   Ellen Delgado 02-17-1983, FNP

## 2022-12-29 NOTE — Patient Instructions (Signed)
Chronic Back Pain When back pain lasts longer than 3 months, it is called chronic back pain. Pain may get worse at certain times (flare-ups). There are things you can do at home to manage your pain. Follow these instructions at home: Pay attention to any changes in your symptoms. Take these actions to help with your pain: Managing pain and stiffness     If told, put ice on the painful area. Your doctor may tell you to use ice for 24-48 hours after the flare-up starts. To do this: Put ice in a plastic bag. Place a towel between your skin and the bag. Leave the ice on for 20 minutes, 2-3 times a day. If told, put heat on the painful area. Do this as often as told by your doctor. Use the heat source that your doctor recommends, such as a moist heat pack or a heating pad. Place a towel between your skin and the heat source. Leave the heat on for 20-30 minutes. Take off the heat if your skin turns bright red. This is especially important if you are unable to feel pain, heat, or cold. You may have a greater risk of getting burned. Soak in a warm bath. This can help relieve pain. Activity  Avoid bending and other activities that make pain worse. When standing: Keep your upper back and neck straight. Keep your shoulders pulled back. Avoid slouching. When sitting: Keep your back straight. Relax your shoulders. Do not round your shoulders or pull them backward. Do not sit or stand in one place for long periods of time. Take short rest breaks during the day. Lying down or standing is usually better than sitting. Resting can help relieve pain. When sitting or lying down for a long time, do some mild activity or stretching. This will help to prevent stiffness and pain. Get regular exercise. Ask your doctor what activities are safe for you. Do not lift anything that is heavier than 10 lb (4.5 kg) or the limit that you are told, until your doctor says that it is safe. To prevent injury when you lift  things: Bend your knees. Keep the weight close to your body. Avoid twisting. Sleep on a firm mattress. Try lying on your side with your knees slightly bent. If you lie on your back, put a pillow under your knees. Medicines Treatment may include medicines for pain and swelling taken by mouth or put on the skin, prescription pain medicine, or muscle relaxants. Take over-the-counter and prescription medicines only as told by your doctor. Ask your doctor if the medicine prescribed to you: Requires you to avoid driving or using machinery. Can cause trouble pooping (constipation). You may need to take these actions to prevent or treat trouble pooping: Drink enough fluid to keep your pee (urine) pale yellow. Take over-the-counter or prescription medicines. Eat foods that are high in fiber. These include beans, whole grains, and fresh fruits and vegetables. Limit foods that are high in fat and sugars. These include fried or sweet foods. General instructions Do not use any products that contain nicotine or tobacco, such as cigarettes, e-cigarettes, and chewing tobacco. If you need help quitting, ask your doctor. Keep all follow-up visits as told by your doctor. This is important. Contact a doctor if: Your pain does not get better with rest or medicine. Your pain gets worse, or you have new pain. You have a high fever. You lose weight very quickly. You have trouble doing your normal activities. Get help right away  if: One or both of your legs or feet feel weak. One or both of your legs or feet lose feeling (have numbness). You have trouble controlling when you poop (have a bowel movement) or pee (urinate). You have bad back pain and: You feel like you may vomit (nauseous), or you vomit. You have pain in your belly (abdomen). You have shortness of breath. You faint. Summary When back pain lasts longer than 3 months, it is called chronic back pain. Pain may get worse at certain times  (flare-ups). Use ice and heat as told by your doctor. Your doctor may tell you to use ice after flare-ups. This information is not intended to replace advice given to you by your health care provider. Make sure you discuss any questions you have with your health care provider. Document Revised: 01/07/2020 Document Reviewed: 01/07/2020 Elsevier Patient Education  [place].

## 2023-01-01 ENCOUNTER — Telehealth: Payer: Self-pay | Admitting: Nurse Practitioner

## 2023-01-01 NOTE — Telephone Encounter (Signed)
Pt needs to confirm whether she is supposed to stay on the 0.25 once weekly until she comes back in April or is she supposed to go up every 4 wks?

## 2023-01-08 ENCOUNTER — Telehealth: Payer: Self-pay | Admitting: Nurse Practitioner

## 2023-01-08 NOTE — Telephone Encounter (Signed)
Patient calling to speak with Ellen Delgado about Ozempic. Please call back.

## 2023-01-08 NOTE — Telephone Encounter (Signed)
Patient is wanting to speak with Ellen Delgado. Patient is wanting to know when she needs to increase dosage?

## 2023-01-19 ENCOUNTER — Other Ambulatory Visit: Payer: Self-pay | Admitting: Nurse Practitioner

## 2023-01-19 DIAGNOSIS — F410 Panic disorder [episodic paroxysmal anxiety] without agoraphobia: Secondary | ICD-10-CM

## 2023-01-19 DIAGNOSIS — M5136 Other intervertebral disc degeneration, lumbar region: Secondary | ICD-10-CM

## 2023-01-19 DIAGNOSIS — M51369 Other intervertebral disc degeneration, lumbar region without mention of lumbar back pain or lower extremity pain: Secondary | ICD-10-CM

## 2023-01-19 MED ORDER — CYCLOBENZAPRINE HCL 10 MG PO TABS: ORAL_TABLET | ORAL | 0 refills | Status: DC

## 2023-01-19 NOTE — Telephone Encounter (Signed)
  Prescription Request  01/19/2023  Is this a "Controlled Substance" medicine?   Have you seen your PCP in the last 2 weeks? No, 1/19   If YES, route message to pool  -  If NO, patient needs to be scheduled for appointment.  What is the name of the medication or equipment? cyclobenzaprine (FLEXERIL) 10 MG tablet   Have you contacted your pharmacy to request a refill? Yes    Which pharmacy would you like this sent to?  [place], [place]      Patient notified that their request is being sent to the clinical staff for review and that they should receive a response within 2 business days.

## 2023-02-06 ENCOUNTER — Encounter: Payer: Self-pay | Admitting: Pharmacist

## 2023-02-06 ENCOUNTER — Ambulatory Visit (INDEPENDENT_AMBULATORY_CARE_PROVIDER_SITE_OTHER): Payer: Medicaid Other | Admitting: Pharmacist

## 2023-02-06 DIAGNOSIS — E119 Type 2 diabetes mellitus without complications: Secondary | ICD-10-CM | POA: Diagnosis not present

## 2023-02-06 NOTE — Progress Notes (Signed)
    S:   57 y.o. female who presents for diabetes evaluation, education, and management.  PMH is significant for HTN, allergic rhinitis, seizures, GERD, and DM.  Patient was referred and last seen by Primary Care Provider, Mary-Margaret Hassell Done, on 12/29/2022.  At last visit, Ozempic 0.25 mg was started.   Today patient is in good spirits. She says she has tolerated Ozempic well without GI upset.   Current diabetes medications include: Ozempic 0.25 mg weekly (Sundays)  Patient reports adherence to taking all medications as prescribed.   Insurance coverage: Medicaid  Patient denies hypoglycemic events.  Reported home fasting blood sugars: does not check  Patient reports nocturia (nighttime urination).  Patient reports neuropathy (nerve pain). Patient denies visual changes. Patient reports self foot exams.   Patient-reported exercise habits: limited by arthritis in hip  Dietary habits: eats a big breakfast on the weekend, goes to the [place] food buffet   O:  7 day average blood glucose: not checking  Lab Results  Component Value Date   HGBA1C 6.5 (H) 12/29/2022   Lipid Panel     Component Value Date/Time   CHOL 153 12/29/2022 1020   CHOL 204 (H) 04/28/2013 0920   TRIG 149 12/29/2022 1020   TRIG 310 (H) 03/18/2015 1046   TRIG 348 (H) 04/28/2013 0920   HDL 66 12/29/2022 1020   HDL 47 03/18/2015 1046   HDL 42 04/28/2013 0920   CHOLHDL 2.3 12/29/2022 1020   LDLCALC 62 12/29/2022 1020   LDLCALC 81 07/29/2014 0913   LDLCALC 92 04/28/2013 0920    Clinical Atherosclerotic Cardiovascular Disease (ASCVD): No  The 10-year ASCVD risk score (Arnett DK, et al., 2019) is: 2.9%   Values used to calculate the score:     Age: 57 years     Sex: Female     Is Non-Hispanic African American: No     Diabetic: Yes     Tobacco smoker: No     Systolic Blood Pressure: [id number] mmHg     Is BP treated: Yes     HDL Cholesterol: 66 mg/dL     Total Cholesterol: 153 mg/dL   Patient is  participating in a Managed Medicaid Plan:  Yes   A/P: Diabetes longstanding currently controlled based on A1c, however patient would like to achieve more weight loss. Patient is able to verbalize appropriate hypoglycemia management plan. Medication adherence appears appropriate.  -Increased dose of GLP-1 Ozempic (semaglutide) to 0.5 mg weekly. Patient denies GI side effects.  -Patient educated on purpose, proper use, and potential adverse effects of Ozempic.  -Extensively discussed pathophysiology of diabetes, recommended lifestyle interventions, dietary effects on blood sugar control.  -Counseled on s/sx of and management of hypoglycemia.  -Next A1c anticipated July 2024.   Patient verbalized understanding of treatment plan.  Total time counseling 35 minutes.    Follow-up:  Pharmacist 1-3 months. PCP clinic visit in April 2024.   Joseph Art, Pharm.D. PGY-2 Ambulatory Care Pharmacy Resident  Regina Eck, PharmD, BCACP Clinical Pharmacist, [facility]

## 2023-02-08 ENCOUNTER — Other Ambulatory Visit: Payer: Self-pay | Admitting: Nurse Practitioner

## 2023-02-08 DIAGNOSIS — R569 Unspecified convulsions: Secondary | ICD-10-CM

## 2023-02-08 DIAGNOSIS — F325 Major depressive disorder, single episode, in full remission: Secondary | ICD-10-CM

## 2023-02-08 DIAGNOSIS — E7800 Pure hypercholesterolemia, unspecified: Secondary | ICD-10-CM

## 2023-02-08 DIAGNOSIS — I10 Essential (primary) hypertension: Secondary | ICD-10-CM

## 2023-02-14 ENCOUNTER — Other Ambulatory Visit: Payer: Self-pay | Admitting: Nurse Practitioner

## 2023-02-14 DIAGNOSIS — M5136 Other intervertebral disc degeneration, lumbar region: Secondary | ICD-10-CM

## 2023-02-16 ENCOUNTER — Telehealth: Payer: Self-pay | Admitting: Nurse Practitioner

## 2023-02-16 DIAGNOSIS — E119 Type 2 diabetes mellitus without complications: Secondary | ICD-10-CM

## 2023-02-16 MED ORDER — OZEMPIC (0.25 OR 0.5 MG/DOSE) 2 MG/3ML ~~LOC~~ SOPN: 0.5000 mg | PEN_INJECTOR | SUBCUTANEOUS | 2 refills | Status: DC

## 2023-02-16 NOTE — Telephone Encounter (Signed)
Left detailed message on patients voicemail with instructions 

## 2023-02-16 NOTE — Telephone Encounter (Signed)
Please review

## 2023-02-16 NOTE — Telephone Encounter (Signed)
She should have another 2 doses 0.'5mg'$  dose in her pen The pen will not let her dial up to the 0.'5mg'$  if there isn't medicine in there Its not an urgent concern as patient is controlled I know she is concerned about this, but she should be okay! I'll go ahead and send in new RX for Ozempic 0.'5mg'$  weekly

## 2023-02-23 ENCOUNTER — Telehealth: Payer: Self-pay

## 2023-02-23 NOTE — Telephone Encounter (Signed)
Ellen Delgado (Ellen Delgado) Rx #[id number] Ozempic (0.25 or 0.5 MG/DOSE) 2MG /3ML pen-injectors Form [employer] Electronic Prior Authorization Form (2017 NCPDP) Created 1 day ago Sent to Plan 5 minutes ago Plan Response 4 minutes ago Submit Clinical Questions less than a minute ago Determination Wait for Determination Please wait for [employer] 2017 NCPDP to return a determination.

## 2023-02-27 NOTE — Telephone Encounter (Signed)
Message from Plan Request Reference Number: [id number]. OZEMPIC INJ 2MG /3ML is approved through 02/23/2024. For further questions, call [employer] at [phone].Marland Kitchen Authorization Expiration Date: February 23, 2024.

## 2023-02-28 ENCOUNTER — Telehealth: Payer: Self-pay | Admitting: Nurse Practitioner

## 2023-02-28 NOTE — Telephone Encounter (Signed)
Attempted to contact - NA 

## 2023-03-12 ENCOUNTER — Other Ambulatory Visit: Payer: Self-pay | Admitting: Nurse Practitioner

## 2023-03-12 DIAGNOSIS — E119 Type 2 diabetes mellitus without complications: Secondary | ICD-10-CM

## 2023-03-15 ENCOUNTER — Other Ambulatory Visit: Payer: Self-pay | Admitting: Nurse Practitioner

## 2023-03-15 DIAGNOSIS — M5136 Other intervertebral disc degeneration, lumbar region: Secondary | ICD-10-CM

## 2023-03-20 ENCOUNTER — Other Ambulatory Visit: Payer: Self-pay | Admitting: Nurse Practitioner

## 2023-03-20 DIAGNOSIS — B001 Herpesviral vesicular dermatitis: Secondary | ICD-10-CM

## 2023-03-22 NOTE — Progress Notes (Signed)
Subjective:    Patient ID: Ellen Delgado, female    DOB: 05-08-1966, 57 y.o.   MRN: [id number]   Chief Complaint: medical management of chronic issues     HPI:  Ellen Delgado is a 57 y.o. who identifies as a female who was assigned female at birth.   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 Essential hypertension No c/o chest pain, sob or headache. Does not check blood pressure at home. BP Readings from Last 3 Encounters:  12/29/22 106/65  06/16/22 108/74  03/10/22 133/86     2. Hyperlipidemia associated with type 2 diabetes mellitus Des try to watch diet but does no exercise. Lab Results  Component Value Date   CHOL 153 12/29/2022   HDL 66 12/29/2022   LDLCALC 62 12/29/2022   TRIG 149 12/29/2022   CHOLHDL 2.3 12/29/2022   The 10-year ASCVD risk score (Arnett DK, et al., 2019) is: 3.8%   3. Type 2 diabetes mellitus without complication, without long-term current use of insulin Fasting blood sugars are running around. No low blood sugars Lab Results  Component Value Date   HGBA1C 6.5 (H) 12/29/2022     4. Gastroesophageal reflux disease without esophagitis Uses OTC meds as needed  5. Depression, major, single episode, complete remission Is on celexa and is doing well.    03/23/2023   10:04 AM 12/29/2022   10:15 AM 06/16/2022   11:05 AM  Depression screen PHQ 2/9  Decreased Interest 0 0 0  Down, Depressed, Hopeless 0 0 0  PHQ - 2 Score 0 0 0  Altered sleeping 0 0 0  Tired, decreased energy 0 0 0  Change in appetite 0 0 0  Feeling bad or failure about yourself  0 0 0  Trouble concentrating 0 0 0  Moving slowly or fidgety/restless 0 0 0  Suicidal thoughts 0 0 0  PHQ-9 Score 0 0 0  Difficult doing work/chores Not difficult at all Not difficult at all Not difficult at all     6. Panic attacks Takes buspar as needed.    12/29/2022   10:15 AM  06/16/2022   11:05 AM 03/10/2022   10:34 AM 09/02/2021    9:58 AM  GAD 7 : Generalized Anxiety Score  Nervous, Anxious, on Edge 0 0 0 0  Control/stop worrying 0 0 1 0  Worry too much - different things 0 0 1 0  Trouble relaxing 0 0 0 0  Restless 0 0 0 0  Easily annoyed or irritable 0 0 0 0  Afraid - awful might happen 0 0 0 0  Total GAD 7 Score 0 0 2 0  Anxiety Difficulty Not difficult at all Not difficult at all Not difficult at all Not difficult at all      7. Primary insomnia Is on ambien to sleep and sleeps about 7-8 hours a night  8. Seizures Is on tegratol and has had no seizure activity in several years.  9. BMI 26.0-26.9,adult Weight is up 3 lbs Wt Readings from Last 3 Encounters:  03/23/23 210 lb (95.3 kg)  12/29/22 207 lb (93.9 kg)  10/27/22 203 lb (92.1 kg)   BMI Readings from Last 3 Encounters:  03/23/23 38.41 kg/m  12/29/22 37.86 kg/m  10/27/22 37.13 kg/m     New complaints: None today  Allergies  Allergen Reactions   Tdap [Tetanus-Diphth-Acell Pertussis]  Shortness Of Breath    sob   Outpatient Encounter Medications as of 03/23/2023  Medication Sig   blood glucose meter kit and supplies KIT Checks blood sugars at home fating in morning and PRN   busPIRone (BUSPAR) 15 MG tablet TAKE ONE TABLET TWICE A DAY.   calcium-vitamin D (OSCAL WITH D) 500-200 MG-UNIT tablet Take 3 tablets by mouth daily.   carbamazepine (TEGRETOL) 200 MG tablet TAKE ONE TABLET FOUR TIMES DAILY   carvedilol (COREG) 6.25 MG tablet TAKE ONE TABLET TWICE DAILY WITH A MEAL   celecoxib (CELEBREX) 200 MG capsule TAKE ONE CAPSULE TWICE DAILY   citalopram (CELEXA) 20 MG tablet TAKE ONE TABLET EVERY DAY   cyclobenzaprine (FLEXERIL) 10 MG tablet TAKE ONE TABLET THREE TIMES DAILY.   lisinopril-hydrochlorothiazide (ZESTORETIC) 20-25 MG tablet TAKE ONE TABLET EVERY DAY   Multiple Vitamin (MULTIVITAMIN) tablet Take 1 tablet by mouth daily.   Semaglutide,0.25 or 0.5MG /DOS, (OZEMPIC, 0.25 OR  0.5 MG/DOSE,) 2 MG/3ML SOPN Inject 0.5 mg into the skin once a week. DX:E11.65   simvastatin (ZOCOR) 40 MG tablet take 1 tablet daily at 6 pm..   valACYclovir (VALTREX) 1000 MG tablet TAKE 2 TABLETS TWICE DAILY AT VEVER BLISTER ONSET.   zolpidem (AMBIEN) 10 MG tablet TAKE ONE TABLET at bedtime AS NEEDED FOR SLEEP   No facility-administered encounter medications on file as of 03/23/2023.    Past Surgical History:  Procedure Laterality Date   CHOLECYSTECTOMY      Family History  Problem Relation Age of Onset   Diabetes Mother    Diabetes Father    Coronary artery disease Father    Cancer Paternal Grandmother    Breast cancer Neg Hx       Controlled substance contract: n/a     Review of Systems  Constitutional:  Negative for diaphoresis.  Eyes:  Negative for pain.  Respiratory:  Negative for shortness of breath.   Cardiovascular:  Negative for chest pain, palpitations and leg swelling.  Gastrointestinal:  Negative for abdominal pain.  Endocrine: Negative for polydipsia.  Skin:  Negative for rash.  Neurological:  Negative for dizziness, weakness and headaches.  Hematological:  Does not bruise/bleed easily.  All other systems reviewed and are negative.      Objective:   Physical Exam Vitals and nursing note reviewed.  Constitutional:      General: She is not in acute distress.    Appearance: Normal appearance. She is well-developed.  HENT:     Head: Normocephalic.     Right Ear: Tympanic membrane normal.     Left Ear: Tympanic membrane normal.     Nose: Nose normal.     Mouth/Throat:     Mouth: Mucous membranes are moist.  Eyes:     Pupils: Pupils are equal, round, and reactive to light.  Neck:     Vascular: No carotid bruit or JVD.  Cardiovascular:     Rate and Rhythm: Normal rate and regular rhythm.     Heart sounds: Normal heart sounds.  Pulmonary:     Effort: Pulmonary effort is normal. No respiratory distress.     Breath sounds: Normal breath sounds. No  wheezing or rales.  Chest:     Chest wall: No tenderness.  Abdominal:     General: Bowel sounds are normal. There is no distension or abdominal bruit.     Palpations: Abdomen is soft. There is no hepatomegaly, splenomegaly, mass or pulsatile mass.     Tenderness: There is no abdominal tenderness.  Musculoskeletal:        General: Normal range of motion.     Cervical back: Normal range of motion and neck supple.  Lymphadenopathy:     Cervical: No cervical adenopathy.  Skin:    General: Skin is warm and dry.  Neurological:     Mental Status: She is alert and oriented to person, place, and time.     Deep Tendon Reflexes: Reflexes are normal and symmetric.  Psychiatric:        Behavior: Behavior normal.        Thought Content: Thought content normal.        Judgment: Judgment normal.     BP 122/75   Pulse 70   Temp (!) 97.5 F (36.4 C) (Temporal)   Resp 20   Ht 5\' 2"  (1.575 m)   Wt 210 lb (95.3 kg)   SpO2 99%   BMI 38.41 kg/m   HGBA1c 6.6%     Assessment & Plan:   Ellen Delgado comes in today with chief complaint of Medical Management of Chronic Issues   Diagnosis and orders addressed:  1. Essential hypertension Low sodium diet - CBC with Differential/Platelet - CMP14+EGFR - carvedilol (COREG) 6.25 MG tablet; TAKE ONE TABLET TWICE DAILY WITH A MEAL  Dispense: 180 tablet; Refill: 1 - lisinopril-hydrochlorothiazide (ZESTORETIC) 20-25 MG tablet; Take 1 tablet by mouth daily.  Dispense: 90 tablet; Refill: 1  2. Hyperlipidemia associated with type 2 diabetes mellitus Low fat diet - Lipid panel - simvastatin (ZOCOR) 40 MG tablet; take 1 tablet daily at 6 pm..  Dispense: 90 tablet; Refill: 1  3. Type 2 diabetes mellitus without complication, without long-term current use of insulin Continue to watch carbs in diet - Bayer DCA Hb A1c Waived - Microalbumin / creatinine urine ratio  4. Gastroesophageal reflux disease without esophagitis Avoid spicy foods Do not eat  2 hours prior to bedtime   5. Depression, major, single episode, complete remission Stress management - citalopram (CELEXA) 20 MG tablet; Take 1 tablet (20 mg total) by mouth daily.  Dispense: 90 tablet; Refill: 1  6. Panic attacks - busPIRone (BUSPAR) 15 MG tablet; Take 1 tablet (15 mg total) by mouth 2 (two) times daily.  Dispense: 60 tablet; Refill: 2  7. Primary insomnia Bedtime routine  8. Seizures Report any seizure activity  9. BMI 26.0-26.9,adult Discussed diet and exercise for person with BMI >25 Will recheck weight in 3-6 months    Labs pending Health Maintenance reviewed Diet and exercise encouraged  Follow up plan: 6 months   Mary-Margaret Daphine Deutscher, FNP

## 2023-03-23 ENCOUNTER — Encounter: Payer: Self-pay | Admitting: Nurse Practitioner

## 2023-03-23 ENCOUNTER — Ambulatory Visit (INDEPENDENT_AMBULATORY_CARE_PROVIDER_SITE_OTHER): Payer: Medicaid Other | Admitting: Nurse Practitioner

## 2023-03-23 VITALS — BP 122/75 | HR 70 | Temp 97.5°F | Resp 20 | Ht 62.0 in | Wt 210.0 lb

## 2023-03-23 DIAGNOSIS — F325 Major depressive disorder, single episode, in full remission: Secondary | ICD-10-CM

## 2023-03-23 DIAGNOSIS — F5101 Primary insomnia: Secondary | ICD-10-CM | POA: Diagnosis not present

## 2023-03-23 DIAGNOSIS — E785 Hyperlipidemia, unspecified: Secondary | ICD-10-CM | POA: Diagnosis not present

## 2023-03-23 DIAGNOSIS — I10 Essential (primary) hypertension: Secondary | ICD-10-CM

## 2023-03-23 DIAGNOSIS — E1169 Type 2 diabetes mellitus with other specified complication: Secondary | ICD-10-CM | POA: Diagnosis not present

## 2023-03-23 DIAGNOSIS — Z6826 Body mass index (BMI) 26.0-26.9, adult: Secondary | ICD-10-CM | POA: Diagnosis not present

## 2023-03-23 DIAGNOSIS — K219 Gastro-esophageal reflux disease without esophagitis: Secondary | ICD-10-CM

## 2023-03-23 DIAGNOSIS — R569 Unspecified convulsions: Secondary | ICD-10-CM | POA: Diagnosis not present

## 2023-03-23 DIAGNOSIS — E119 Type 2 diabetes mellitus without complications: Secondary | ICD-10-CM

## 2023-03-23 DIAGNOSIS — F410 Panic disorder [episodic paroxysmal anxiety] without agoraphobia: Secondary | ICD-10-CM | POA: Diagnosis not present

## 2023-03-23 LAB — CBC WITH DIFFERENTIAL/PLATELET
Basophils Absolute: 0 10*3/uL (ref 0.0–0.2)
Basos: 1 %
EOS (ABSOLUTE): 0.3 10*3/uL (ref 0.0–0.4)
Eos: 4 %
Hematocrit: 40.2 % (ref 34.0–46.6)
Hemoglobin: 13.6 g/dL (ref 11.1–15.9)
Immature Grans (Abs): 0 10*3/uL (ref 0.0–0.1)
Immature Granulocytes: 0 %
Lymphocytes Absolute: 1.6 10*3/uL (ref 0.7–3.1)
Lymphs: 26 %
MCH: 31.2 pg (ref 26.6–33.0)
MCHC: 33.8 g/dL (ref 31.5–35.7)
MCV: 92 fL (ref 79–97)
Monocytes Absolute: 0.6 10*3/uL (ref 0.1–0.9)
Monocytes: 10 %
Neutrophils Absolute: 3.6 10*3/uL (ref 1.4–7.0)
Neutrophils: 59 %
Platelets: 264 10*3/uL (ref 150–450)
RBC: 4.36 x10E6/uL (ref 3.77–5.28)
RDW: 12 % (ref 11.7–15.4)
WBC: 6.2 10*3/uL (ref 3.4–10.8)

## 2023-03-23 LAB — LIPID PANEL
Chol/HDL Ratio: 2.7 ratio (ref 0.0–4.4)
Cholesterol, Total: 157 mg/dL (ref 100–199)
HDL: 59 mg/dL (ref >39)
LDL Chol Calc (NIH): 73 mg/dL (ref 0–99)
Triglycerides: 148 mg/dL (ref 0–149)
VLDL Cholesterol Cal: 25 mg/dL (ref 5–40)

## 2023-03-23 LAB — CMP14+EGFR
ALT: 23 IU/L (ref 0–32)
AST: 22 IU/L (ref 0–40)
Albumin/Globulin Ratio: 1.5 (ref 1.2–2.2)
Albumin: 4.1 g/dL (ref 3.8–4.9)
Alkaline Phosphatase: 90 IU/L (ref 44–121)
BUN/Creatinine Ratio: 23 (ref 9–23)
BUN: 21 mg/dL (ref 6–24)
Bilirubin Total: 0.2 mg/dL (ref 0.0–1.2)
CO2: 25 mmol/L (ref 20–29)
Calcium: 10.6 mg/dL — ABNORMAL HIGH (ref 8.7–10.2)
Chloride: 100 mmol/L (ref 96–106)
Creatinine, Ser: 0.9 mg/dL (ref 0.57–1.00)
Globulin, Total: 2.7 g/dL (ref 1.5–4.5)
Glucose: 115 mg/dL — ABNORMAL HIGH (ref 70–99)
Potassium: 4.7 mmol/L (ref 3.5–5.2)
Sodium: 139 mmol/L (ref 134–144)
Total Protein: 6.8 g/dL (ref 6.0–8.5)
eGFR: 75 mL/min/{1.73_m2} (ref >59)

## 2023-03-23 LAB — BAYER DCA HB A1C WAIVED: HB A1C (BAYER DCA - WAIVED): 6.6 % — ABNORMAL HIGH (ref 4.8–5.6)

## 2023-03-23 MED ORDER — SIMVASTATIN 40 MG PO TABS: ORAL_TABLET | ORAL | 1 refills | Status: DC

## 2023-03-23 MED ORDER — BUSPIRONE HCL 15 MG PO TABS: 15.0000 mg | ORAL_TABLET | Freq: Two times a day (BID) | ORAL | 2 refills | Status: DC

## 2023-03-23 MED ORDER — LISINOPRIL-HYDROCHLOROTHIAZIDE 20-25 MG PO TABS: 1.0000 | ORAL_TABLET | Freq: Every day | ORAL | 1 refills | Status: DC

## 2023-03-23 MED ORDER — SEMAGLUTIDE (1 MG/DOSE) 4 MG/3ML ~~LOC~~ SOPN: 1.0000 mg | PEN_INJECTOR | SUBCUTANEOUS | 5 refills | Status: DC

## 2023-03-23 MED ORDER — CARVEDILOL 6.25 MG PO TABS: ORAL_TABLET | ORAL | 1 refills | Status: DC

## 2023-03-23 MED ORDER — CITALOPRAM HYDROBROMIDE 20 MG PO TABS: 20.0000 mg | ORAL_TABLET | Freq: Every day | ORAL | 1 refills | Status: DC

## 2023-03-23 NOTE — Patient Instructions (Signed)
Exercising to Stay Healthy To become healthy and stay healthy, it is recommended that you do moderate-intensity and vigorous-intensity exercise. You can tell that you are exercising at a moderate intensity if your heart starts beating faster and you start breathing faster but can still hold a conversation. You can tell that you are exercising at a vigorous intensity if you are breathing much harder and faster and cannot hold a conversation while exercising. How can exercise benefit me? Exercising regularly is important. It has many health benefits, such as: Improving overall fitness, flexibility, and endurance. Increasing bone density. Helping with weight control. Decreasing body fat. Increasing muscle strength and endurance. Reducing stress and tension, anxiety, depression, or anger. Improving overall health. What guidelines should I follow while exercising? Before you start a new exercise program, talk with your health care provider. Do not exercise so much that you hurt yourself, feel dizzy, or get very short of breath. Wear comfortable clothes and wear shoes with good support. Drink plenty of water while you exercise to prevent dehydration or heat stroke. Work out until your breathing and your heartbeat get faster (moderate intensity). How often should I exercise? Choose an activity that you enjoy, and set realistic goals. Your health care provider can help you make an activity plan that is individually designed and works best for you. Exercise regularly as told by your health care provider. This may include: Doing strength training two times a week, such as: Lifting weights. Using resistance bands. Push-ups. Sit-ups. Yoga. Doing a certain intensity of exercise for a given amount of time. Choose from these options: A total of 150 minutes of moderate-intensity exercise every week. A total of 75 minutes of vigorous-intensity exercise every week. A mix of moderate-intensity and  vigorous-intensity exercise every week. Children, pregnant women, people who have not exercised regularly, people who are overweight, and older adults may need to talk with a health care provider about what activities are safe to perform. If you have a medical condition, be sure to talk with your health care provider before you start a new exercise program. What are some exercise ideas? Moderate-intensity exercise ideas include: Walking 1 mile (1.6 km) in about 15 minutes. Biking. Hiking. Golfing. Dancing. Water aerobics. Vigorous-intensity exercise ideas include: Walking 4.5 miles (7.2 km) or more in about 1 hour. Jogging or running 5 miles (8 km) in about 1 hour. Biking 10 miles (16.1 km) or more in about 1 hour. Lap swimming. Roller-skating or in-line skating. Cross-country skiing. Vigorous competitive sports, such as football, basketball, and soccer. Jumping rope. Aerobic dancing. What are some everyday activities that can help me get exercise? Yard work, such as: Pushing a lawn mower. Raking and bagging leaves. Washing your car. Pushing a stroller. Shoveling snow. Gardening. Washing windows or floors. How can I be more active in my day-to-day activities? Use stairs instead of an elevator. Take a walk during your lunch break. If you drive, park your car farther away from your work or school. If you take public transportation, get off one stop early and walk the rest of the way. Stand up or walk around during all of your indoor phone calls. Get up, stretch, and walk around every 30 minutes throughout the day. Enjoy exercise with a friend. Support to continue exercising will help you keep a regular routine of activity. Where to find more information You can find more information about exercising to stay healthy from: [place]. Department of Health and Human Services: www.hhs.gov Centers for Disease Control and Prevention (  CDC): www.cdc.gov Summary Exercising regularly is  important. It will improve your overall fitness, flexibility, and endurance. Regular exercise will also improve your overall health. It can help you control your weight, reduce stress, and improve your bone density. Do not exercise so much that you hurt yourself, feel dizzy, or get very short of breath. Before you start a new exercise program, talk with your health care provider. This information is not intended to replace advice given to you by your health care provider. Make sure you discuss any questions you have with your health care provider. Document Revised: 03/25/2021 Document Reviewed: 03/25/2021 Elsevier Patient Education  2023 [employer]  

## 2023-04-04 ENCOUNTER — Other Ambulatory Visit: Payer: Self-pay | Admitting: Nurse Practitioner

## 2023-04-04 DIAGNOSIS — R569 Unspecified convulsions: Secondary | ICD-10-CM

## 2023-04-09 ENCOUNTER — Ambulatory Visit: Payer: Medicaid Other | Admitting: Nurse Practitioner

## 2023-04-12 ENCOUNTER — Other Ambulatory Visit: Payer: Self-pay | Admitting: Nurse Practitioner

## 2023-04-12 DIAGNOSIS — B001 Herpesviral vesicular dermatitis: Secondary | ICD-10-CM

## 2023-04-20 ENCOUNTER — Telehealth: Payer: Self-pay | Admitting: Nurse Practitioner

## 2023-04-20 MED ORDER — SEMAGLUTIDE (2 MG/DOSE) 8 MG/3ML ~~LOC~~ SOPN: 2.0000 mg | PEN_INJECTOR | SUBCUTANEOUS | 2 refills | Status: DC

## 2023-04-20 NOTE — Telephone Encounter (Signed)
New ozempic prescription sent to pharmacy

## 2023-04-20 NOTE — Telephone Encounter (Signed)
Pt made aware

## 2023-04-20 NOTE — Telephone Encounter (Signed)
Has completed 1 month of weekly injections.  Would like to increase the dose.  Please send to [facility] pharmacy.

## 2023-04-22 ENCOUNTER — Other Ambulatory Visit: Payer: Self-pay | Admitting: Nurse Practitioner

## 2023-04-22 DIAGNOSIS — M5136 Other intervertebral disc degeneration, lumbar region: Secondary | ICD-10-CM

## 2023-05-09 IMAGING — MG MM DIGITAL SCREENING BILAT W/ TOMO AND CAD
6 of 10 series · 6 of 30 positions shown · non-contrast
Comparison: Previous exam(s).

CLINICAL DATA: Screening.

EXAM:
DIGITAL SCREENING BILATERAL MAMMOGRAM WITH TOMOSYNTHESIS AND CAD
TECHNIQUE: Bilateral screening digital craniocaudal and mediolateral oblique
mammograms were obtained. Bilateral screening digital breast
tomosynthesis was performed. The images were evaluated with
computer-aided detection.

[L CC synth-2D]
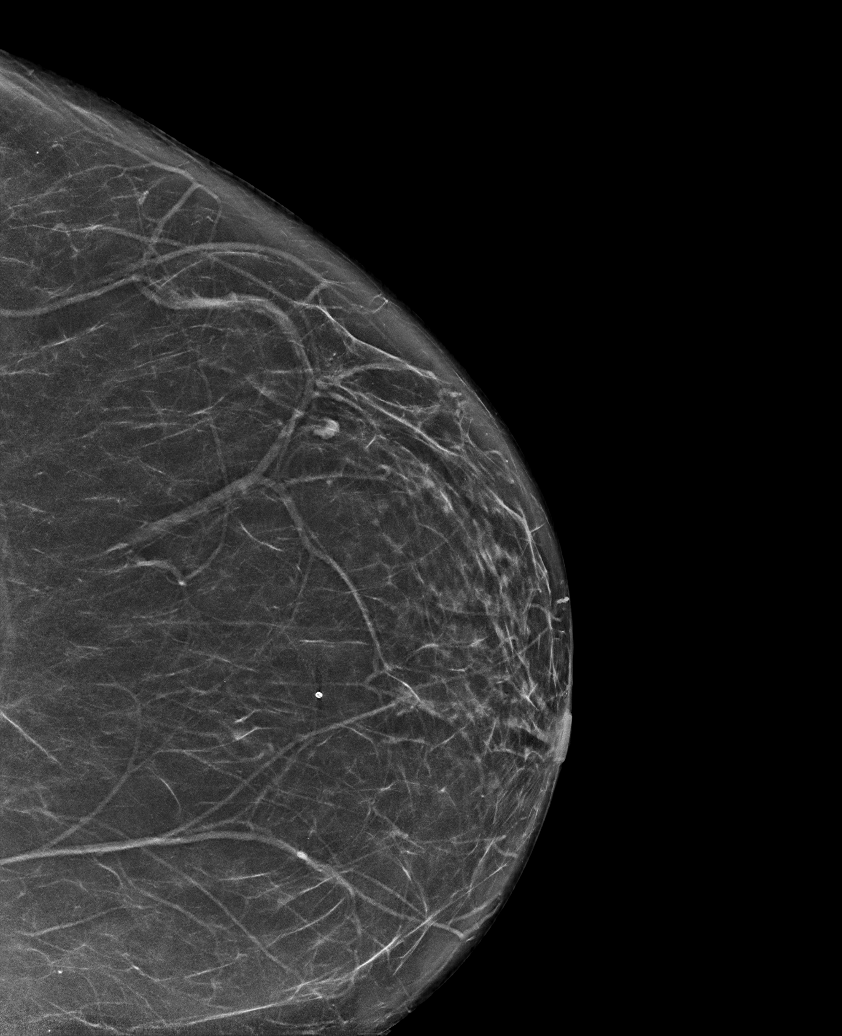

[L MLO synth-2D]
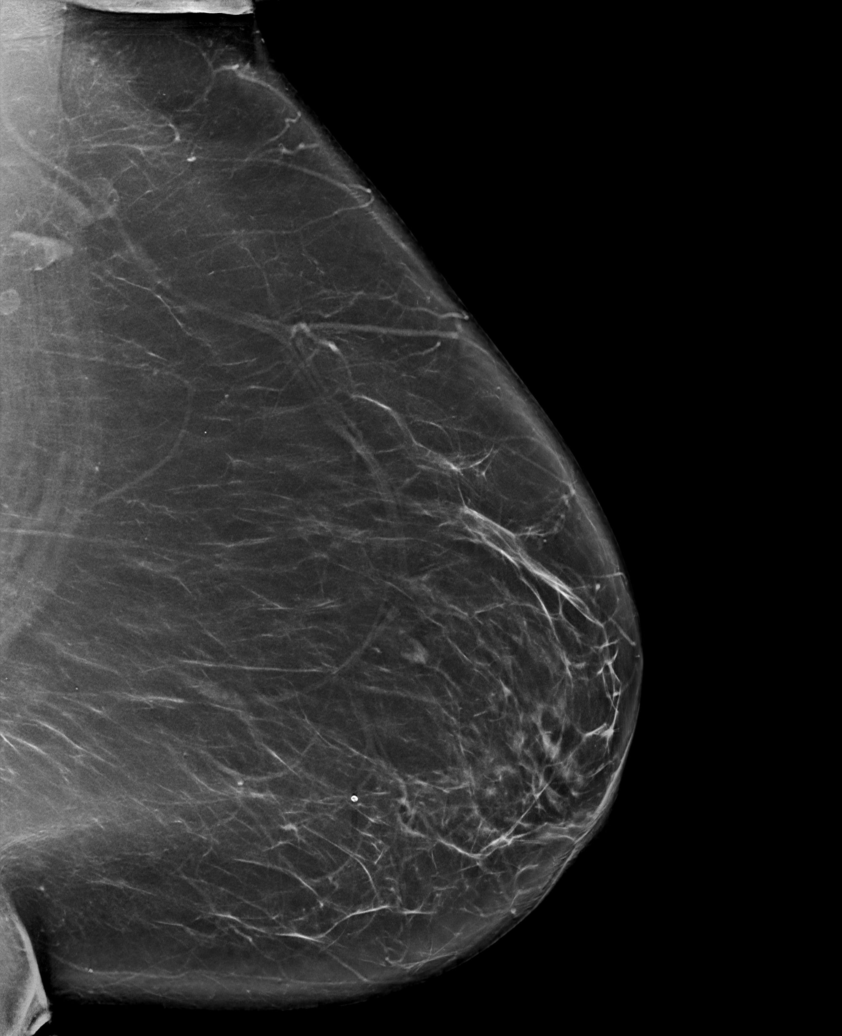

[R MLO synth-2D]
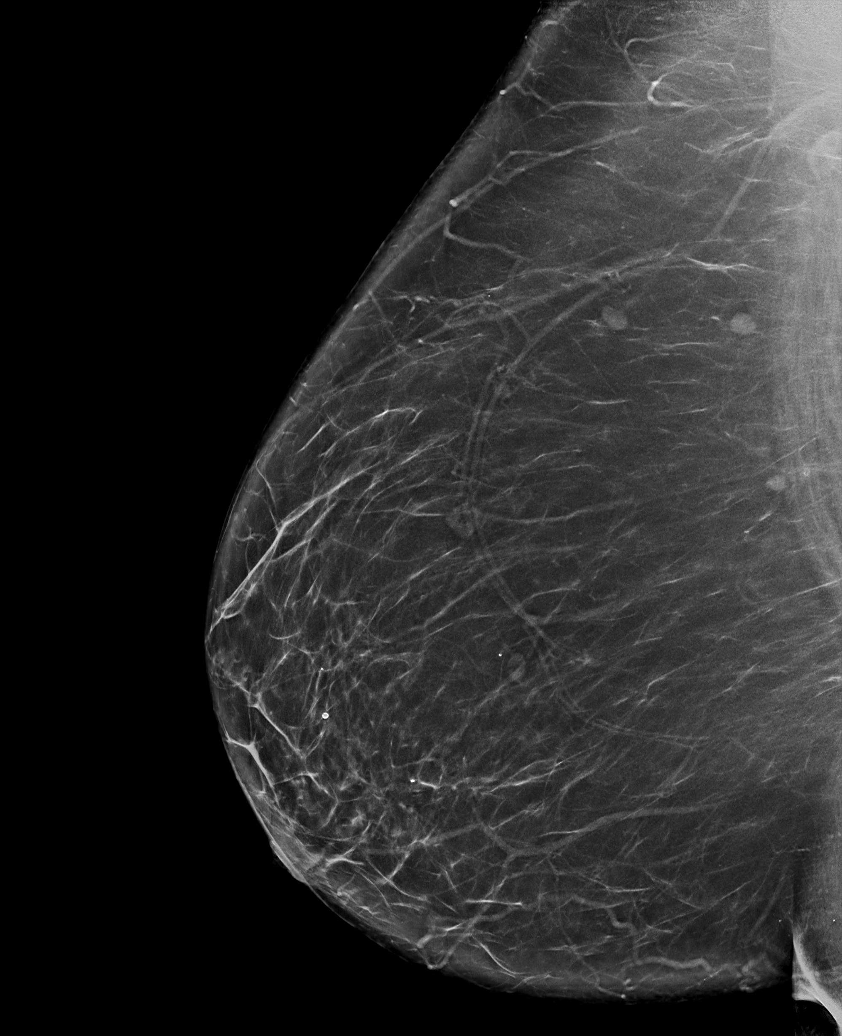

[L CV synth-2D]
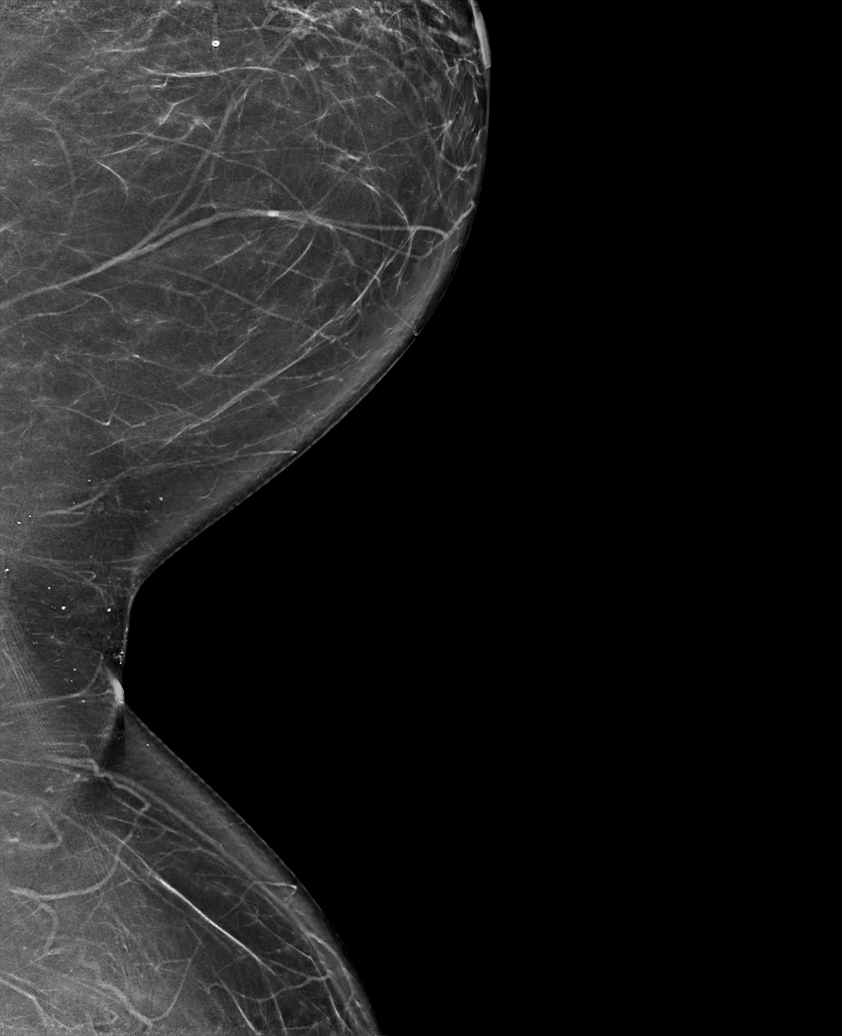

[R CC synth-2D]
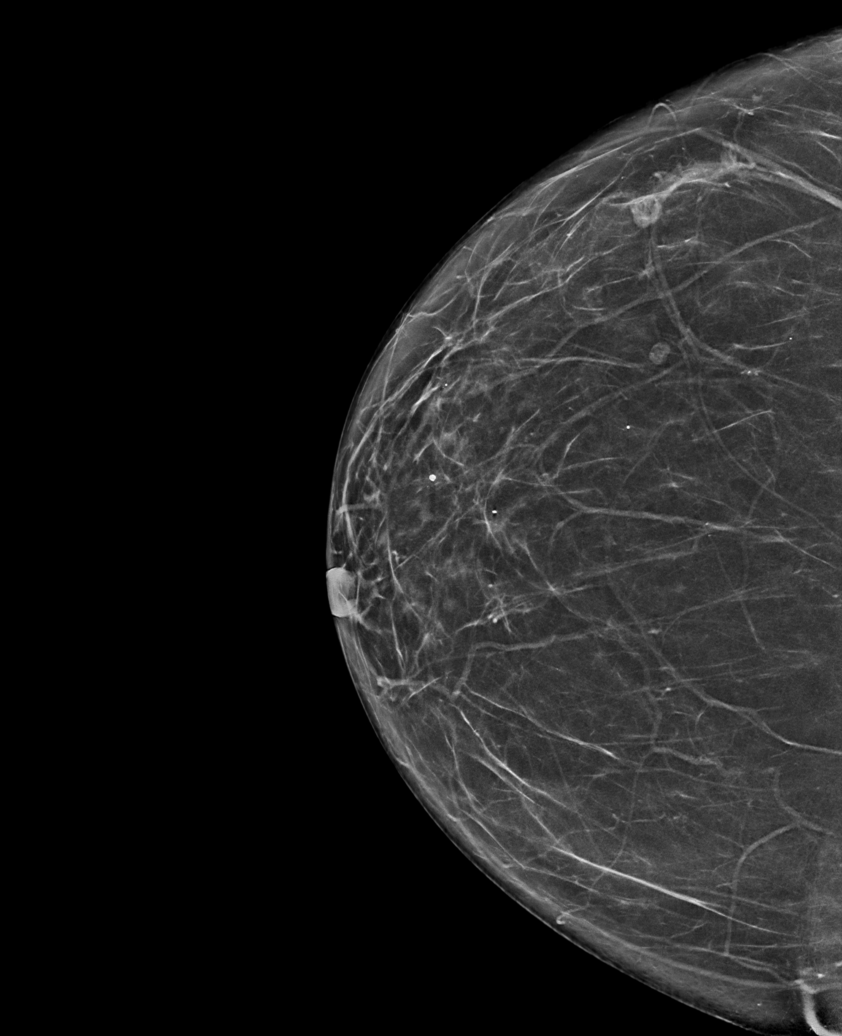

[L CV tomo · tomo slice 38/75.0]
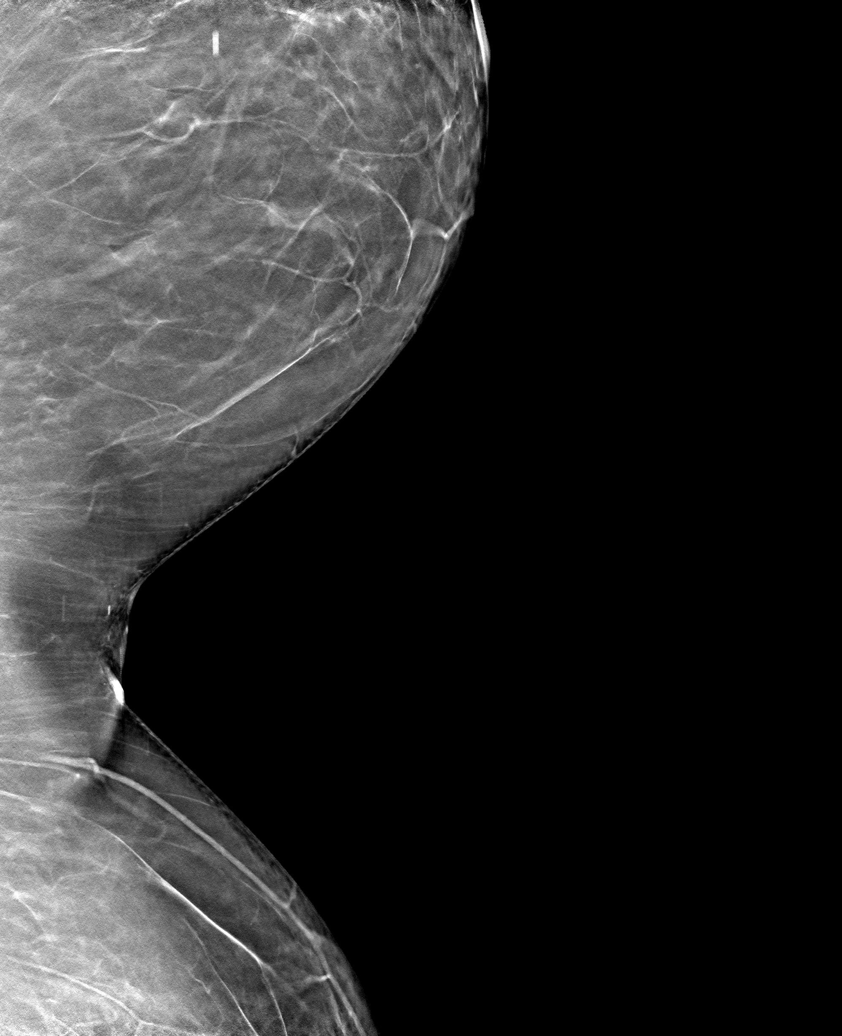

[6 of 30 positions shown; findings below may reference images not displayed]

ACR Breast Density Category b: There are scattered areas of
fibroglandular density.
FINDINGS: There are no findings suspicious for malignancy.
IMPRESSION: No mammographic evidence of malignancy. A result letter of this
screening mammogram will be mailed directly to the patient.

RECOMMENDATION:
Screening mammogram in one year. (Code:51-O-LD2)

BI-RADS CATEGORY  1: Negative.

## 2023-06-21 ENCOUNTER — Other Ambulatory Visit: Payer: Self-pay | Admitting: Nurse Practitioner

## 2023-06-21 DIAGNOSIS — M5136 Other intervertebral disc degeneration, lumbar region: Secondary | ICD-10-CM

## 2023-07-09 ENCOUNTER — Other Ambulatory Visit: Payer: Self-pay | Admitting: Nurse Practitioner

## 2023-07-09 DIAGNOSIS — F5101 Primary insomnia: Secondary | ICD-10-CM

## 2023-07-10 ENCOUNTER — Telehealth: Payer: Self-pay | Admitting: Nurse Practitioner

## 2023-07-10 ENCOUNTER — Encounter: Payer: Self-pay | Admitting: Nurse Practitioner

## 2023-07-10 DIAGNOSIS — F5101 Primary insomnia: Secondary | ICD-10-CM

## 2023-07-10 MED ORDER — ZOLPIDEM TARTRATE 10 MG PO TABS: ORAL_TABLET | ORAL | 0 refills | Status: DC

## 2023-07-10 NOTE — Telephone Encounter (Signed)
Reviewed last provider note from 03/2023. Insomnia addressed and at the time she was UTD on UDS and CSC at that visit.  She will need to renew at her f/u with PCP as it lapsed 06/2023. The Narcotic Database has been reviewed.  There were no red flags.     68m rx sent to pharmacy  Meds ordered this encounter  Medications   zolpidem (AMBIEN) 10 MG tablet    Sig: TAKE ONE TABLET at bedtime AS NEEDED FOR SLEEP    Dispense:  30 tablet    Refill:  0

## 2023-07-10 NOTE — Telephone Encounter (Signed)
Patient aware and verbalizes understanding. 

## 2023-07-10 NOTE — Telephone Encounter (Signed)
  Prescription Request  07/10/2023  Is this a "Controlled Substance" medicine? YES  Have you seen your PCP in the last 2 weeks? No pt last appt was on 03/23/2023, MMM scheduled her f/u appt for 10/05/2023  If YES, route message to pool  -  If NO, patient needs to be scheduled for appointment.  What is the name of the medication or equipment? zolpidem (AMBIEN) 10 MG tablet   Have you contacted your pharmacy to request a refill? yes   Which pharmacy would you like this sent to? [facility]   Patient notified that their request is being sent to the clinical staff for review and that they should receive a response within 2 business days.

## 2023-07-27 ENCOUNTER — Other Ambulatory Visit: Payer: Self-pay | Admitting: Nurse Practitioner

## 2023-07-29 ENCOUNTER — Other Ambulatory Visit: Payer: Self-pay | Admitting: Nurse Practitioner

## 2023-07-29 DIAGNOSIS — F410 Panic disorder [episodic paroxysmal anxiety] without agoraphobia: Secondary | ICD-10-CM

## 2023-07-29 DIAGNOSIS — M5136 Other intervertebral disc degeneration, lumbar region: Secondary | ICD-10-CM

## 2023-08-06 ENCOUNTER — Other Ambulatory Visit: Payer: Self-pay | Admitting: Nurse Practitioner

## 2023-08-06 DIAGNOSIS — M5136 Other intervertebral disc degeneration, lumbar region: Secondary | ICD-10-CM

## 2023-09-03 ENCOUNTER — Other Ambulatory Visit: Payer: Self-pay | Admitting: Nurse Practitioner

## 2023-09-03 DIAGNOSIS — M5136 Other intervertebral disc degeneration, lumbar region: Secondary | ICD-10-CM

## 2023-09-07 ENCOUNTER — Other Ambulatory Visit: Payer: Self-pay | Admitting: Nurse Practitioner

## 2023-09-07 DIAGNOSIS — F5101 Primary insomnia: Secondary | ICD-10-CM

## 2023-09-20 DIAGNOSIS — H5213 Myopia, bilateral: Secondary | ICD-10-CM | POA: Diagnosis not present

## 2023-09-23 ENCOUNTER — Other Ambulatory Visit: Payer: Self-pay | Admitting: Nurse Practitioner

## 2023-09-23 DIAGNOSIS — R569 Unspecified convulsions: Secondary | ICD-10-CM

## 2023-09-23 DIAGNOSIS — M51369 Other intervertebral disc degeneration, lumbar region without mention of lumbar back pain or lower extremity pain: Secondary | ICD-10-CM

## 2023-09-23 DIAGNOSIS — F410 Panic disorder [episodic paroxysmal anxiety] without agoraphobia: Secondary | ICD-10-CM

## 2023-10-01 ENCOUNTER — Ambulatory Visit: Payer: Medicaid Other | Admitting: Nurse Practitioner

## 2023-10-03 ENCOUNTER — Other Ambulatory Visit: Payer: Self-pay | Admitting: Nurse Practitioner

## 2023-10-03 DIAGNOSIS — M51369 Other intervertebral disc degeneration, lumbar region without mention of lumbar back pain or lower extremity pain: Secondary | ICD-10-CM

## 2023-10-05 ENCOUNTER — Other Ambulatory Visit (HOSPITAL_COMMUNITY)
Admission: RE | Admit: 2023-10-05 | Discharge: 2023-10-05 | Disposition: A | Discharge status: Home / Self Care | Payer: Medicaid Other | Source: Ambulatory Visit | Attending: Nurse Practitioner | Admitting: Nurse Practitioner

## 2023-10-05 ENCOUNTER — Encounter: Payer: Self-pay | Admitting: Nurse Practitioner

## 2023-10-05 ENCOUNTER — Ambulatory Visit (INDEPENDENT_AMBULATORY_CARE_PROVIDER_SITE_OTHER): Payer: Medicaid Other

## 2023-10-05 ENCOUNTER — Ambulatory Visit (INDEPENDENT_AMBULATORY_CARE_PROVIDER_SITE_OTHER): Payer: Medicaid Other | Admitting: Nurse Practitioner

## 2023-10-05 ENCOUNTER — Telehealth: Payer: Self-pay | Admitting: Nurse Practitioner

## 2023-10-05 VITALS — BP 125/76 | HR 79 | Temp 98.7°F | Resp 20 | Ht 62.0 in | Wt 205.0 lb

## 2023-10-05 DIAGNOSIS — M545 Low back pain, unspecified: Secondary | ICD-10-CM

## 2023-10-05 DIAGNOSIS — I10 Essential (primary) hypertension: Secondary | ICD-10-CM | POA: Diagnosis not present

## 2023-10-05 DIAGNOSIS — E785 Hyperlipidemia, unspecified: Secondary | ICD-10-CM

## 2023-10-05 DIAGNOSIS — K219 Gastro-esophageal reflux disease without esophagitis: Secondary | ICD-10-CM | POA: Diagnosis not present

## 2023-10-05 DIAGNOSIS — Z0000 Encounter for general adult medical examination without abnormal findings: Secondary | ICD-10-CM

## 2023-10-05 DIAGNOSIS — K120 Recurrent oral aphthae: Secondary | ICD-10-CM | POA: Diagnosis not present

## 2023-10-05 DIAGNOSIS — Z6826 Body mass index (BMI) 26.0-26.9, adult: Secondary | ICD-10-CM

## 2023-10-05 DIAGNOSIS — E1169 Type 2 diabetes mellitus with other specified complication: Secondary | ICD-10-CM

## 2023-10-05 DIAGNOSIS — F5101 Primary insomnia: Secondary | ICD-10-CM | POA: Diagnosis not present

## 2023-10-05 DIAGNOSIS — R569 Unspecified convulsions: Secondary | ICD-10-CM | POA: Diagnosis not present

## 2023-10-05 DIAGNOSIS — F325 Major depressive disorder, single episode, in full remission: Secondary | ICD-10-CM | POA: Diagnosis not present

## 2023-10-05 DIAGNOSIS — G8929 Other chronic pain: Secondary | ICD-10-CM

## 2023-10-05 DIAGNOSIS — E119 Type 2 diabetes mellitus without complications: Secondary | ICD-10-CM

## 2023-10-05 DIAGNOSIS — Z23 Encounter for immunization: Secondary | ICD-10-CM | POA: Diagnosis not present

## 2023-10-05 DIAGNOSIS — F410 Panic disorder [episodic paroxysmal anxiety] without agoraphobia: Secondary | ICD-10-CM

## 2023-10-05 LAB — BAYER DCA HB A1C WAIVED: HB A1C (BAYER DCA - WAIVED): 6.2 % — ABNORMAL HIGH (ref 4.8–5.6)

## 2023-10-05 MED ORDER — LISINOPRIL-HYDROCHLOROTHIAZIDE 20-25 MG PO TABS: 1.0000 | ORAL_TABLET | Freq: Every day | ORAL | 1 refills | Status: DC

## 2023-10-05 MED ORDER — CARBAMAZEPINE 200 MG PO TABS: ORAL_TABLET | ORAL | 0 refills | Status: DC

## 2023-10-05 MED ORDER — CELECOXIB 400 MG PO CAPS: 400.0000 mg | ORAL_CAPSULE | Freq: Every day | ORAL | 3 refills | Status: DC

## 2023-10-05 MED ORDER — SEMAGLUTIDE (2 MG/DOSE) 8 MG/3ML ~~LOC~~ SOPN: 2.0000 mg | PEN_INJECTOR | SUBCUTANEOUS | 5 refills | Status: DC

## 2023-10-05 MED ORDER — SIMVASTATIN 40 MG PO TABS: ORAL_TABLET | ORAL | 1 refills | Status: DC

## 2023-10-05 MED ORDER — CITALOPRAM HYDROBROMIDE 20 MG PO TABS: 20.0000 mg | ORAL_TABLET | Freq: Every day | ORAL | 1 refills | Status: DC

## 2023-10-05 MED ORDER — ZOLPIDEM TARTRATE 10 MG PO TABS: ORAL_TABLET | ORAL | 5 refills | Status: DC

## 2023-10-05 MED ORDER — CARVEDILOL 6.25 MG PO TABS: ORAL_TABLET | ORAL | 1 refills | Status: DC

## 2023-10-05 MED ORDER — BUSPIRONE HCL 15 MG PO TABS: 15.0000 mg | ORAL_TABLET | Freq: Two times a day (BID) | ORAL | 0 refills | Status: DC

## 2023-10-05 NOTE — Progress Notes (Signed)
Subjective:    Patient ID: Ellen Delgado, female    DOB: [birth year], 57 y.o.   MRN: [id number]   Chief Complaint: Annual Exam    HPI:  Ellen Delgado is a 57 y.o. who identifies as a female who was assigned female at birth.   Social history: Lives with: boyfriend Work history: disability   Comes in today for follow up of the following chronic medical issues:  1. Annual physical exam With pap. LMP over 5 years  2. Essential hypertension No c/o chest pain, sob or headache. Does not check blood pressure at home. BP Readings from Last 3 Encounters:  10/05/23 125/76  03/23/23 122/75  12/29/22 106/65     3. Hyperlipidemia associated with type 2 diabetes mellitus (HCC) Does not watch diet and does no exercise at all. Lab Results  Component Value Date   CHOL 157 03/23/2023   HDL 59 03/23/2023   LDLCALC 73 03/23/2023   TRIG 148 03/23/2023   CHOLHDL 2.7 03/23/2023     4. Type 2 diabetes mellitus without complication, without long-term current use of insulin (HCC) Does not check blood sugars at home.  Lab Results  Component Value Date   HGBA1C 6.6 (H) 03/23/2023     5. Depression, major, single episode, complete remission (HCC) 6. Panic attacks Is on celexa and is dong well    10/05/2023   10:09 AM 03/23/2023   10:04 AM 12/29/2022   10:15 AM  Depression screen PHQ 2/9  Decreased Interest 0 0 0  Down, Depressed, Hopeless 0 0 0  PHQ - 2 Score 0 0 0  Altered sleeping 0 0 0  Tired, decreased energy 0 0 0  Change in appetite 0 0 0  Feeling bad or failure about yourself  0 0 0  Trouble concentrating 0 0 0  Moving slowly or fidgety/restless 0 0 0  Suicidal thoughts 0 0 0  PHQ-9 Score 0 0 0  Difficult doing work/chores Not difficult at all Not difficult at all Not difficult at all      10/05/2023   10:09 AM 12/29/2022   10:15 AM 06/16/2022   11:05 AM 03/10/2022   10:34 AM  GAD 7 : Generalized Anxiety Score  Nervous, Anxious, on Edge 0 0 0 0  Control/stop  worrying 0 0 0 1  Worry too much - different things 0 0 0 1  Trouble relaxing 0 0 0 0  Restless 0 0 0 0  Easily annoyed or irritable 0 0 0 0  Afraid - awful might happen 0 0 0 0  Total GAD 7 Score 0 0 0 2  Anxiety Difficulty Not difficult at all Not difficult at all Not difficult at all Not difficult at all      7. Primary insomnia Is on ambien and has been sleeping well  8. Seizures (HCC) No recent seizure activity.is still on tegratol  9. Gastroesophageal reflux disease without esophagitis Uses OTC meds as needed  10. BMI 26.0-26.9,adult Weight is down 5 lbs Wt Readings from Last 3 Encounters:  10/05/23 205 lb (93 kg)  03/23/23 210 lb (95.3 kg)  12/29/22 207 lb (93.9 kg)   BMI Readings from Last 3 Encounters:  10/05/23 37.49 kg/m  03/23/23 38.41 kg/m  12/29/22 37.86 kg/m      New complaints: -Back pain when standing. She is on celebrex but wants to increase dose. - has a sore under top lip that seems to be getting worse. Has been using listerine. Some  foods burn area.  Allergies  Allergen Reactions   Tdap [Tetanus-Diphth-Acell Pertussis] Shortness Of Breath    sob   Outpatient Encounter Medications as of 10/05/2023  Medication Sig   blood glucose meter kit and supplies KIT Checks blood sugars at home fating in morning and PRN   busPIRone (BUSPAR) 15 MG tablet Take 1 tablet (15 mg total) by mouth 2 (two) times daily. **NEEDS TO BE SEEN BEFORE NEXT REFILL**   calcium-vitamin D (OSCAL WITH D) 500-200 MG-UNIT tablet Take 3 tablets by mouth daily.   carbamazepine (TEGRETOL) 200 MG tablet TAKE ONE TABLET FOUR TIMES DAILY **NEEDS TO BE SEEN BEFORE NEXT REFILL**   carvedilol (COREG) 6.25 MG tablet TAKE ONE TABLET TWICE DAILY WITH A MEAL   celecoxib (CELEBREX) 200 MG capsule Take 1 capsule (200 mg total) by mouth 2 (two) times daily. **NEEDS TO BE SEEN BEFORE NEXT REFILL**   citalopram (CELEXA) 20 MG tablet Take 1 tablet (20 mg total) by mouth daily.    cyclobenzaprine (FLEXERIL) 10 MG tablet TAKE ONE TABLET THREE TIMES DAILY.   lisinopril-hydrochlorothiazide (ZESTORETIC) 20-25 MG tablet Take 1 tablet by mouth daily.   Multiple Vitamin (MULTIVITAMIN) tablet Take 1 tablet by mouth daily.   OZEMPIC, 2 MG/DOSE, 8 MG/3ML SOPN INJECT 2MG . AS DIRECTED EVERY WEEK.   simvastatin (ZOCOR) 40 MG tablet take 1 tablet daily at 6 pm..   valACYclovir (VALTREX) 1000 MG tablet TAKE 2 TABLETS TWICE DAILY AT FEVER BLISTER ONSET.   zolpidem (AMBIEN) 10 MG tablet TAKE ONE TABLET BY MOUTH AT BEDTIME AS NEEDED FOR SLEEP   No facility-administered encounter medications on file as of 10/05/2023.    Past Surgical History:  Procedure Laterality Date   CHOLECYSTECTOMY      Family History  Problem Relation Age of Onset   Diabetes Mother    Diabetes Father    Coronary artery disease Father    Cancer Paternal Grandmother    Breast cancer Neg Hx       Controlled substance contract: 01/03/23    Review of Systems  Constitutional:  Negative for diaphoresis.  Eyes:  Negative for pain.  Respiratory:  Negative for shortness of breath.   Cardiovascular:  Negative for chest pain, palpitations and leg swelling.  Gastrointestinal:  Negative for abdominal pain.  Endocrine: Negative for polydipsia.  Skin:  Negative for rash.  Neurological:  Negative for dizziness, weakness and headaches.  Hematological:  Does not bruise/bleed easily.  All other systems reviewed and are negative.      Objective:   Physical Exam Vitals and nursing note reviewed.  Constitutional:      General: She is not in acute distress.    Appearance: Normal appearance. She is well-developed.  HENT:     Head: Normocephalic.     Right Ear: Tympanic membrane normal.     Left Ear: Tympanic membrane normal.     Nose: Nose normal.     Mouth/Throat:     Mouth: Mucous membranes are moist.     Comments: White covered 1cm lesion under left side of top  lip Eyes:     Pupils: Pupils are equal,  round, and reactive to light.  Neck:     Vascular: No carotid bruit or JVD.  Cardiovascular:     Rate and Rhythm: Normal rate and regular rhythm.     Heart sounds: Normal heart sounds.  Pulmonary:     Effort: Pulmonary effort is normal. No respiratory distress.     Breath sounds: Normal breath sounds.  No wheezing or rales.  Chest:     Chest wall: No tenderness.  Abdominal:     General: Bowel sounds are normal. There is no distension or abdominal bruit.     Palpations: Abdomen is soft. There is no hepatomegaly, splenomegaly, mass or pulsatile mass.     Tenderness: There is no abdominal tenderness.  Genitourinary:    General: Normal vulva.     Vagina: Vaginal discharge present.     Rectum: Normal.     Comments: Cervix parous and pink No adnexal masses or tenderness Musculoskeletal:        General: Normal range of motion.     Cervical back: Normal range of motion and neck supple.  Lymphadenopathy:     Cervical: No cervical adenopathy.  Skin:    General: Skin is warm and dry.  Neurological:     Mental Status: She is alert and oriented to person, place, and time.     Deep Tendon Reflexes: Reflexes are normal and symmetric.  Psychiatric:        Behavior: Behavior normal.        Thought Content: Thought content normal.        Judgment: Judgment normal.     BP 125/76   Pulse 79   Temp 98.7 F (37.1 C) (Temporal)   Resp 20   Ht 5\' 2"  (1.575 m)   Wt 205 lb (93 kg)   SpO2 98%   BMI 37.49 kg/m        Assessment & Plan:   CARAMIA FETTIG comes in today with chief complaint of Annual Exam   Diagnosis and orders addressed:  1. Annual physical exam - EKG 12-Lead - DG Chest 2 View - Cytology - PAP  2. Essential hypertension Low sodium diet - CBC with Differential/Platelet - CMP14+EGFR - Lipid panel - Microalbumin / creatinine urine ratio - Bayer DCA Hb A1c Waived - carvedilol (COREG) 6.25 MG tablet; TAKE ONE TABLET TWICE DAILY WITH A MEAL  Dispense: 180 tablet;  Refill: 1 - lisinopril-hydrochlorothiazide (ZESTORETIC) 20-25 MG tablet; Take 1 tablet by mouth daily.  Dispense: 90 tablet; Refill: 1  3. Hyperlipidemia associated with type 2 diabetes mellitus (HCC) Low fat diet - CBC with Differential/Platelet - CMP14+EGFR - Lipid panel - Microalbumin / creatinine urine ratio - Bayer DCA Hb A1c Waived - simvastatin (ZOCOR) 40 MG tablet; take 1 tablet daily at 6 pm..  Dispense: 90 tablet; Refill: 1  4. Type 2 diabetes mellitus without complication, without long-term current use of insulin (HCC) Continue to watch carbs in diet - CBC with Differential/Platelet - CMP14+EGFR - Lipid panel - Microalbumin / creatinine urine ratio - Bayer DCA Hb A1c Waived - Semaglutide, 2 MG/DOSE, 8 MG/3ML SOPN; Inject 2 mg as directed once a week.  Dispense: 3 mL; Refill: 5  5. Depression, major, single episode, complete remission (HCC) Stress management - citalopram (CELEXA) 20 MG tablet; Take 1 tablet (20 mg total) by mouth daily.  Dispense: 90 tablet; Refill: 1  6. Panic attacks - busPIRone (BUSPAR) 15 MG tablet; Take 1 tablet (15 mg total) by mouth 2 (two) times daily. **NEEDS TO BE SEEN BEFORE NEXT REFILL**  Dispense: 60 tablet; Refill: 0  7. Primary insomnia Bedtime routine - CBC with Differential/Platelet - CMP14+EGFR - Lipid panel - Microalbumin / creatinine urine ratio - Bayer DCA Hb A1c Waived - zolpidem (AMBIEN) 10 MG tablet; TAKE ONE TABLET BY MOUTH AT BEDTIME AS NEEDED FOR SLEEP  Dispense: 30 tablet; Refill: 5  8.  Seizures (HCC) - carbamazepine (TEGRETOL) 200 MG tablet; TAKE ONE TABLET FOUR TIMES DAILY **NEEDS TO BE SEEN BEFORE NEXT REFILL**  Dispense: 120 tablet; Refill: 0  9. Gastroesophageal reflux disease without esophagitis Avoid spicy foods Do not eat 2 hours prior to bedtime   10. BMI 26.0-26.9,adult Discussed diet and exercise for person with BMI >25 Will recheck weight in 3-6 months   11. Chronic midline low back pain without  sciatica Increase celebrex to 400mg  inmorning - celecoxib (CELEBREX) 400 MG capsule; Take 1 capsule (400 mg total) by mouth daily after breakfast.  Dispense: 30 capsule; Refill: 3  12. Canker sores oral Gargle with peroxide mouth wash If not improving let me know and will do referral or follow up with your dentist   Labs pending Health Maintenance reviewed Diet and exercise encouraged  Follow up plan: 6 months   Mary-Margaret Daphine Deutscher, FNP

## 2023-10-05 NOTE — Telephone Encounter (Signed)
Pt called requesting to speak with PCPs nurse. Says she was told that her test strips and things for her diabetes would be sent to pharmacy and she is still waiting on them to be sent.  Also says Raynelle Fanning was going to speak with [facility]MM about changing her to [facility]. Wanted to know if Raynelle Fanning had done that yet or not.

## 2023-10-05 NOTE — Patient Instructions (Signed)
Exercising to Stay Healthy To become healthy and stay healthy, it is recommended that you do moderate-intensity and vigorous-intensity exercise. You can tell that you are exercising at a moderate intensity if your heart starts beating faster and you start breathing faster but can still hold a conversation. You can tell that you are exercising at a vigorous intensity if you are breathing much harder and faster and cannot hold a conversation while exercising. How can exercise benefit me? Exercising regularly is important. It has many health benefits, such as: Improving overall fitness, flexibility, and endurance. Increasing bone density. Helping with weight control. Decreasing body fat. Increasing muscle strength and endurance. Reducing stress and tension, anxiety, depression, or anger. Improving overall health. What guidelines should I follow while exercising? Before you start a new exercise program, talk with your health care provider. Do not exercise so much that you hurt yourself, feel dizzy, or get very short of breath. Wear comfortable clothes and wear shoes with good support. Drink plenty of water while you exercise to prevent dehydration or heat stroke. Work out until your breathing and your heartbeat get faster (moderate intensity). How often should I exercise? Choose an activity that you enjoy, and set realistic goals. Your health care provider can help you make an activity plan that is individually designed and works best for you. Exercise regularly as told by your health care provider. This may include: Doing strength training two times a week, such as: Lifting weights. Using resistance bands. Push-ups. Sit-ups. Yoga. Doing a certain intensity of exercise for a given amount of time. Choose from these options: A total of 150 minutes of moderate-intensity exercise every week. A total of 75 minutes of vigorous-intensity exercise every week. A mix of moderate-intensity and  vigorous-intensity exercise every week. Children, pregnant women, people who have not exercised regularly, people who are overweight, and older adults may need to talk with a health care provider about what activities are safe to perform. If you have a medical condition, be sure to talk with your health care provider before you start a new exercise program. What are some exercise ideas? Moderate-intensity exercise ideas include: Walking 1 mile (1.6 km) in about 15 minutes. Biking. Hiking. Golfing. Dancing. Water aerobics. Vigorous-intensity exercise ideas include: Walking 4.5 miles (7.2 km) or more in about 1 hour. Jogging or running 5 miles (8 km) in about 1 hour. Biking 10 miles (16.1 km) or more in about 1 hour. Lap swimming. Roller-skating or in-line skating. Cross-country skiing. Vigorous competitive sports, such as football, basketball, and soccer. Jumping rope. Aerobic dancing. What are some everyday activities that can help me get exercise? Yard work, such as: Pushing a lawn mower. Raking and bagging leaves. Washing your car. Pushing a stroller. Shoveling snow. Gardening. Washing windows or floors. How can I be more active in my day-to-day activities? Use stairs instead of an elevator. Take a walk during your lunch break. If you drive, park your car farther away from your work or school. If you take public transportation, get off one stop early and walk the rest of the way. Stand up or walk around during all of your indoor phone calls. Get up, stretch, and walk around every 30 minutes throughout the day. Enjoy exercise with a friend. Support to continue exercising will help you keep a regular routine of activity. Where to find more information You can find more information about exercising to stay healthy from: U.S. Department of Health and Human Services: www.hhs.gov Centers for Disease Control and Prevention (  CDC): www.cdc.gov Summary Exercising regularly is  important. It will improve your overall fitness, flexibility, and endurance. Regular exercise will also improve your overall health. It can help you control your weight, reduce stress, and improve your bone density. Do not exercise so much that you hurt yourself, feel dizzy, or get very short of breath. Before you start a new exercise program, talk with your health care provider. This information is not intended to replace advice given to you by your health care provider. Make sure you discuss any questions you have with your health care provider. Document Revised: 03/25/2021 Document Reviewed: 03/25/2021 Elsevier Patient Education  2024 Elsevier Inc.  

## 2023-10-05 NOTE — Addendum Note (Signed)
Addended by: Cleda Daub on: 10/05/2023 04:54 PM   Modules accepted: Orders

## 2023-10-06 LAB — CBC WITH DIFFERENTIAL/PLATELET
Basophils Absolute: 0.1 10*3/uL (ref 0.0–0.2)
Basos: 1 %
EOS (ABSOLUTE): 0.3 10*3/uL (ref 0.0–0.4)
Eos: 5 %
Hematocrit: 42.4 % (ref 34.0–46.6)
Hemoglobin: 13.9 g/dL (ref 11.1–15.9)
Immature Grans (Abs): 0 10*3/uL (ref 0.0–0.1)
Immature Granulocytes: 0 %
Lymphocytes Absolute: 1.7 10*3/uL (ref 0.7–3.1)
Lymphs: 23 %
MCH: 31.2 pg (ref 26.6–33.0)
MCHC: 32.8 g/dL (ref 31.5–35.7)
MCV: 95 fL (ref 79–97)
Monocytes Absolute: 0.8 10*3/uL (ref 0.1–0.9)
Monocytes: 10 %
Neutrophils Absolute: 4.7 10*3/uL (ref 1.4–7.0)
Neutrophils: 61 %
Platelets: 267 10*3/uL (ref 150–450)
RBC: 4.45 x10E6/uL (ref 3.77–5.28)
RDW: 11.8 % (ref 11.7–15.4)
WBC: 7.6 10*3/uL (ref 3.4–10.8)

## 2023-10-06 LAB — CMP14+EGFR
ALT: 21 [IU]/L (ref 0–32)
AST: 21 [IU]/L (ref 0–40)
Albumin: 4.2 g/dL (ref 3.8–4.9)
Alkaline Phosphatase: 78 [IU]/L (ref 44–121)
BUN/Creatinine Ratio: 22 (ref 9–23)
BUN: 19 mg/dL (ref 6–24)
Bilirubin Total: <0.2 mg/dL (ref 0.0–1.2)
CO2: 27 mmol/L (ref 20–29)
Calcium: 10.1 mg/dL (ref 8.7–10.2)
Chloride: 97 mmol/L (ref 96–106)
Creatinine, Ser: 0.88 mg/dL (ref 0.57–1.00)
Globulin, Total: 2.7 g/dL (ref 1.5–4.5)
Glucose: 107 mg/dL — ABNORMAL HIGH (ref 70–99)
Potassium: 4.5 mmol/L (ref 3.5–5.2)
Sodium: 136 mmol/L (ref 134–144)
Total Protein: 6.9 g/dL (ref 6.0–8.5)
eGFR: 77 mL/min/{1.73_m2} (ref >59)

## 2023-10-06 LAB — LIPID PANEL
Chol/HDL Ratio: 2.3 ratio (ref 0.0–4.4)
Cholesterol, Total: 154 mg/dL (ref 100–199)
HDL: 68 mg/dL (ref >39)
LDL Chol Calc (NIH): 62 mg/dL (ref 0–99)
Triglycerides: 143 mg/dL (ref 0–149)
VLDL Cholesterol Cal: 24 mg/dL (ref 5–40)

## 2023-10-06 LAB — MICROALBUMIN / CREATININE URINE RATIO
Creatinine, Urine: 49.1 mg/dL
Microalb/Creat Ratio: 7 mg/g{creat} (ref 0–29)
Microalbumin, Urine: 3.6 ug/mL

## 2023-10-08 NOTE — Telephone Encounter (Signed)
ACCU-CHEK Guide FOR Machine, strips- Lancets -  ACCU-CHEK fast CLIX

## 2023-10-08 NOTE — Telephone Encounter (Signed)
Test strips and lancets are not on file. Left message for patient to contact the office with the name of her supplies needed.

## 2023-10-09 LAB — CYTOLOGY - PAP: Diagnosis: NEGATIVE

## 2023-10-09 MED ORDER — ACCU-CHEK AVIVA PLUS VI STRP: ORAL_STRIP | 12 refills | Status: AC

## 2023-10-09 MED ORDER — ACCU-CHEK SOFTCLIX LANCETS MISC: 12 refills | Status: AC

## 2023-10-09 NOTE — Telephone Encounter (Signed)
Blood sugar supplies sent to [facility] pharmacy per patients request

## 2023-10-11 ENCOUNTER — Telehealth: Payer: Self-pay | Admitting: Nurse Practitioner

## 2023-10-11 ENCOUNTER — Encounter: Payer: Self-pay | Admitting: Nurse Practitioner

## 2023-10-11 NOTE — Telephone Encounter (Signed)
Called and spoke with pharmacist and gave verbal order for a blood sugar machine that insurance will cover.

## 2023-10-11 NOTE — Telephone Encounter (Signed)
Called and discussed lab results with patient. Patient verbalized understanding

## 2023-10-11 NOTE — Telephone Encounter (Signed)
Patient return call. ?

## 2023-10-15 ENCOUNTER — Telehealth: Payer: Self-pay | Admitting: Nurse Practitioner

## 2023-10-15 ENCOUNTER — Other Ambulatory Visit (HOSPITAL_COMMUNITY): Payer: Self-pay

## 2023-10-15 ENCOUNTER — Telehealth: Payer: Self-pay

## 2023-10-15 NOTE — Telephone Encounter (Signed)
Was normal

## 2023-10-15 NOTE — Telephone Encounter (Signed)
PA request has been Submitted. New Encounter created for follow up. For additional info see Pharmacy Prior Auth telephone encounter from 10/15/23.

## 2023-10-15 NOTE — Telephone Encounter (Signed)
Pt has been notified.

## 2023-10-15 NOTE — Telephone Encounter (Signed)
Pharmacy Patient Advocate Encounter   Received notification from Pt Calls Messages that prior authorization for Mounjaro 2.5MG /0.5ML auto-injectors is required/requested.   Insurance verification completed.   The patient is insured through [facility] MEDICAID .   Per test claim: PA required; PA submitted to above mentioned insurance via CoverMyMeds Key/confirmation #/EOC [id number] Status is pending

## 2023-10-16 ENCOUNTER — Other Ambulatory Visit (HOSPITAL_COMMUNITY): Payer: Self-pay

## 2023-10-16 ENCOUNTER — Telehealth: Payer: Self-pay | Admitting: Nurse Practitioner

## 2023-10-16 NOTE — Telephone Encounter (Signed)
Pharmacy Patient Advocate Encounter  Received notification from [facility] that Prior Authorization for [facility] 2.5MG /0.5ML auto-injectors  has been APPROVED from 10/15/23 to 10/14/24. Ran test claim, Copay is $4. This test claim was processed through [facility]- copay amounts may vary at other pharmacies due to pharmacy/plan contracts, or as the patient moves through the different stages of their insurance plan.   PA #/Case ID/Reference #: [id number]

## 2023-10-16 NOTE — Telephone Encounter (Signed)
Pt has been notified.

## 2023-10-18 ENCOUNTER — Telehealth: Payer: Self-pay | Admitting: Family Medicine

## 2023-10-18 ENCOUNTER — Other Ambulatory Visit: Payer: Self-pay | Admitting: Nurse Practitioner

## 2023-10-18 MED ORDER — TIRZEPATIDE 5 MG/0.5ML ~~LOC~~ SOAJ: 5.0000 mg | SUBCUTANEOUS | 2 refills | Status: DC

## 2023-10-18 NOTE — Telephone Encounter (Signed)
Spoke with patient regarding this. She wants her Greggory Keen Rx that was approved with PA sent to her local pharmacy [facility] pharmacy).

## 2023-10-18 NOTE — Telephone Encounter (Signed)
Returned patients call. She wants her approved [facility] Rx sent to [facility]. Was told by the pharmacist there that her copay would also be $4 if they can get it.

## 2023-10-18 NOTE — Telephone Encounter (Signed)
Copied from CRM [id number]. Topic: Clinical - Medication Question >> Oct 18, 2023  3:28 PM Raven B wrote: Reason for CRM: PT wants antibiotic prescribed for spots near mouth. Call back # [phone]

## 2023-10-18 NOTE — Telephone Encounter (Signed)
Patient has to be seen for antibiotics

## 2023-10-18 NOTE — Telephone Encounter (Signed)
Called and spoke with patient. Patient states that she will call back for an appt.

## 2023-10-18 NOTE — Addendum Note (Signed)
Addended by: Bennie Pierini on: 10/18/2023 02:11 PM   Modules accepted: Orders

## 2023-10-18 NOTE — Telephone Encounter (Signed)
Patient spoke with Raynelle Fanning and she changed her to [facility] and patient states per pharmacy it was approved. She wants it sent to [facility]

## 2023-10-18 NOTE — Telephone Encounter (Signed)
Copied from CRM [id number]. Topic: Clinical - Medication Refill >> Oct 18, 2023  3:31 PM Raven B wrote: Most Recent Primary Care Visit:  Provider: Bennie Pierini  Department: [facility]  Visit Type: PHYSICAL  Date: 10/05/2023  Medication: terzepatide  Has the patient contacted their pharmacy? Yes (Agent: If no, request that the patient contact the pharmacy for the refill. If patient does not wish to contact the pharmacy document the reason why and proceed with request.) (Agent: If yes, when and what did the pharmacy advise?) Pharmacy advised she would receive . , PT stated she was informed by   Is this the correct pharmacy for this prescription?  If no, delete pharmacy and type the correct one.  This is the patient's preferred pharmacy:  [facility] [place], [place] - [address] [place] [address] Phone: [phone] Fax: [phone]   Has the prescription been filled recently?   Is the patient out of the medication?   Has the patient been seen for an appointment in the last year OR does the patient have an upcoming appointment?   Can we respond through MyChart?   Agent: Please be advised that Rx refills may take up to 3 business days. We ask that you follow-up with your pharmacy.

## 2023-10-20 ENCOUNTER — Other Ambulatory Visit: Payer: Self-pay | Admitting: Nurse Practitioner

## 2023-10-22 ENCOUNTER — Other Ambulatory Visit: Payer: Self-pay | Admitting: Nurse Practitioner

## 2023-10-22 DIAGNOSIS — I10 Essential (primary) hypertension: Secondary | ICD-10-CM

## 2023-10-22 DIAGNOSIS — E1169 Type 2 diabetes mellitus with other specified complication: Secondary | ICD-10-CM

## 2023-10-22 DIAGNOSIS — F325 Major depressive disorder, single episode, in full remission: Secondary | ICD-10-CM

## 2023-10-31 ENCOUNTER — Other Ambulatory Visit: Payer: Self-pay | Admitting: Nurse Practitioner

## 2023-10-31 DIAGNOSIS — M51369 Other intervertebral disc degeneration, lumbar region without mention of lumbar back pain or lower extremity pain: Secondary | ICD-10-CM

## 2023-11-22 ENCOUNTER — Telehealth: Payer: Self-pay

## 2023-11-22 MED ORDER — TIRZEPATIDE 7.5 MG/0.5ML ~~LOC~~ SOAJ
7.5000 mg | SUBCUTANEOUS | 0 refills | Status: DC
Start: 2023-11-22 — End: 2023-12-18

## 2023-11-22 NOTE — Addendum Note (Signed)
Addended by: Bennie Pierini on: 11/22/2023 01:30 PM   Modules accepted: Orders

## 2023-11-22 NOTE — Telephone Encounter (Signed)
Copied from CRM [id number]. Topic: Clinical - Medication Question >> Nov 22, 2023  9:43 AM Almira Coaster wrote: Reason for CRM: Patient would like to discuss going up on the dosage on her Mounjaro medication. She would like a call back today if possible.

## 2023-12-14 ENCOUNTER — Encounter: Payer: Self-pay | Admitting: Nurse Practitioner

## 2023-12-18 ENCOUNTER — Telehealth: Payer: Self-pay | Admitting: Family Medicine

## 2023-12-18 ENCOUNTER — Other Ambulatory Visit: Payer: Self-pay | Admitting: Nurse Practitioner

## 2023-12-18 DIAGNOSIS — F410 Panic disorder [episodic paroxysmal anxiety] without agoraphobia: Secondary | ICD-10-CM

## 2023-12-18 DIAGNOSIS — M51369 Other intervertebral disc degeneration, lumbar region without mention of lumbar back pain or lower extremity pain: Secondary | ICD-10-CM

## 2023-12-18 DIAGNOSIS — G8929 Other chronic pain: Secondary | ICD-10-CM

## 2023-12-18 MED ORDER — TIRZEPATIDE 10 MG/0.5ML ~~LOC~~ SOAJ
10.0000 mg | SUBCUTANEOUS | 1 refills | Status: DC
Start: 2023-12-18 — End: 2024-03-14

## 2023-12-18 MED ORDER — CELECOXIB 400 MG PO CAPS: 400.0000 mg | ORAL_CAPSULE | Freq: Every day | ORAL | 3 refills | Status: DC

## 2023-12-18 NOTE — Telephone Encounter (Signed)
 Refill sent.

## 2023-12-18 NOTE — Telephone Encounter (Signed)
 Copied from CRM [id number]. Topic: Clinical - Medication Question >> Dec 18, 2023  1:54 PM Geroge Baseman wrote: Reason for CRM: tirzepatide [facility]) 7.5 MG/0.5ML Pen patient is wanting the next higher dosage for her next prescription she picks it up time.

## 2023-12-18 NOTE — Telephone Encounter (Signed)
 Copied from CRM [id number]. Topic: Clinical - Medication Refill >> Dec 18, 2023  1:52 PM Powell HERO wrote: Most Recent Primary Care Visit:  Provider: GLADIS MUSTARD  Department: [facility]  Visit Type: PHYSICAL  Date: 10/05/2023  Medication: celecoxib  (CELEBREX ) 400 MG capsule  Has the patient contacted their pharmacy? Yes Yes, they advised her to call the office  Is this the correct pharmacy for this prescription? Yes If no, delete pharmacy and type the correct one.  This is the patient's preferred pharmacy:  [facility] [place], [place] - [address] [place] [address] Phone: [phone] Fax: [phone]   Has the prescription been filled recently? No  Is the patient out of the medication? No  Has the patient been seen for an appointment in the last year OR does the patient have an upcoming appointment? Yes  Can we respond through MyChart? No, phone call please  Agent: Please be advised that Rx refills may take up to 3 business days. We ask that you follow-up with your pharmacy.

## 2023-12-28 ENCOUNTER — Other Ambulatory Visit: Payer: Self-pay | Admitting: Nurse Practitioner

## 2023-12-28 DIAGNOSIS — Z1231 Encounter for screening mammogram for malignant neoplasm of breast: Secondary | ICD-10-CM

## 2024-01-02 ENCOUNTER — Ambulatory Visit
Admission: RE | Admit: 2024-01-02 | Discharge: 2024-01-02 | Disposition: A | Discharge status: Home / Self Care | Payer: Medicaid Other | Source: Ambulatory Visit | Attending: Nurse Practitioner | Admitting: Nurse Practitioner

## 2024-01-02 DIAGNOSIS — Z1231 Encounter for screening mammogram for malignant neoplasm of breast: Secondary | ICD-10-CM | POA: Diagnosis not present

## 2024-01-03 ENCOUNTER — Telehealth: Payer: Self-pay | Admitting: Nurse Practitioner

## 2024-01-03 NOTE — Telephone Encounter (Signed)
Copied from CRM [id number]. Topic: Clinical - Medical Advice >> Jan 03, 2024  2:32 PM Patsy Lager T wrote: Reason for CRM: patient called sttd she had a mammogram on yesterday and wanted to make sure provider knows she has a rash under her right breast the size of a quarter. She did not want false results to come back because of the spot.

## 2024-01-14 ENCOUNTER — Other Ambulatory Visit: Payer: Self-pay | Admitting: Nurse Practitioner

## 2024-01-14 DIAGNOSIS — F410 Panic disorder [episodic paroxysmal anxiety] without agoraphobia: Secondary | ICD-10-CM

## 2024-01-14 DIAGNOSIS — M51369 Other intervertebral disc degeneration, lumbar region without mention of lumbar back pain or lower extremity pain: Secondary | ICD-10-CM

## 2024-01-14 DIAGNOSIS — R569 Unspecified convulsions: Secondary | ICD-10-CM

## 2024-01-16 ENCOUNTER — Telehealth: Payer: Self-pay

## 2024-01-16 NOTE — Telephone Encounter (Signed)
 Discussed with patient. LS

## 2024-01-16 NOTE — Telephone Encounter (Signed)
 Copied from CRM [id number]. Topic: Clinical - Lab/Test Results >> Jan 16, 2024  3:07 PM Graeme ORN wrote: Reason for CRM: Patient called stated she received a call from Imaging. Called [facility]. States they did not call. Patient concerned she has not heard anything back from mammogram results. Advised letter mailed. Patient has not received letter. Thank You

## 2024-01-30 ENCOUNTER — Other Ambulatory Visit (HOSPITAL_COMMUNITY): Payer: Self-pay

## 2024-02-04 ENCOUNTER — Encounter: Payer: Self-pay | Admitting: *Deleted

## 2024-02-06 ENCOUNTER — Encounter: Payer: Self-pay | Admitting: *Deleted

## 2024-02-12 ENCOUNTER — Other Ambulatory Visit: Payer: Self-pay | Admitting: Nurse Practitioner

## 2024-02-12 DIAGNOSIS — F410 Panic disorder [episodic paroxysmal anxiety] without agoraphobia: Secondary | ICD-10-CM

## 2024-02-12 DIAGNOSIS — M51369 Other intervertebral disc degeneration, lumbar region without mention of lumbar back pain or lower extremity pain: Secondary | ICD-10-CM

## 2024-02-12 DIAGNOSIS — R569 Unspecified convulsions: Secondary | ICD-10-CM

## 2024-03-12 ENCOUNTER — Other Ambulatory Visit: Payer: Self-pay | Admitting: Nurse Practitioner

## 2024-03-12 DIAGNOSIS — R569 Unspecified convulsions: Secondary | ICD-10-CM

## 2024-03-12 DIAGNOSIS — M51369 Other intervertebral disc degeneration, lumbar region without mention of lumbar back pain or lower extremity pain: Secondary | ICD-10-CM

## 2024-03-12 DIAGNOSIS — F410 Panic disorder [episodic paroxysmal anxiety] without agoraphobia: Secondary | ICD-10-CM

## 2024-03-14 ENCOUNTER — Encounter: Payer: Self-pay | Admitting: Nurse Practitioner

## 2024-03-14 ENCOUNTER — Ambulatory Visit: Payer: Medicaid Other | Admitting: Nurse Practitioner

## 2024-03-14 VITALS — BP 138/72 | Temp 97.8°F | Ht 62.0 in | Wt 201.8 lb

## 2024-03-14 DIAGNOSIS — Z7985 Long-term (current) use of injectable non-insulin antidiabetic drugs: Secondary | ICD-10-CM | POA: Diagnosis not present

## 2024-03-14 DIAGNOSIS — F325 Major depressive disorder, single episode, in full remission: Secondary | ICD-10-CM

## 2024-03-14 DIAGNOSIS — F410 Panic disorder [episodic paroxysmal anxiety] without agoraphobia: Secondary | ICD-10-CM

## 2024-03-14 DIAGNOSIS — Z6826 Body mass index (BMI) 26.0-26.9, adult: Secondary | ICD-10-CM

## 2024-03-14 DIAGNOSIS — E1169 Type 2 diabetes mellitus with other specified complication: Secondary | ICD-10-CM

## 2024-03-14 DIAGNOSIS — F5101 Primary insomnia: Secondary | ICD-10-CM

## 2024-03-14 DIAGNOSIS — I10 Essential (primary) hypertension: Secondary | ICD-10-CM | POA: Diagnosis not present

## 2024-03-14 DIAGNOSIS — E119 Type 2 diabetes mellitus without complications: Secondary | ICD-10-CM

## 2024-03-14 DIAGNOSIS — K219 Gastro-esophageal reflux disease without esophagitis: Secondary | ICD-10-CM

## 2024-03-14 DIAGNOSIS — R569 Unspecified convulsions: Secondary | ICD-10-CM

## 2024-03-14 DIAGNOSIS — E785 Hyperlipidemia, unspecified: Secondary | ICD-10-CM

## 2024-03-14 LAB — BAYER DCA HB A1C WAIVED: HB A1C (BAYER DCA - WAIVED): 6.1 % — ABNORMAL HIGH (ref 4.8–5.6)

## 2024-03-14 MED ORDER — SIMVASTATIN 40 MG PO TABS: ORAL_TABLET | ORAL | 1 refills | Status: DC

## 2024-03-14 MED ORDER — CARBAMAZEPINE 200 MG PO TABS
ORAL_TABLET | ORAL | 0 refills | Status: DC
Start: 2024-03-14 — End: 2024-05-07

## 2024-03-14 MED ORDER — ZOLPIDEM TARTRATE 10 MG PO TABS: ORAL_TABLET | ORAL | 5 refills | Status: DC

## 2024-03-14 MED ORDER — CARVEDILOL 6.25 MG PO TABS: ORAL_TABLET | ORAL | 1 refills | Status: DC

## 2024-03-14 MED ORDER — NYSTATIN 100000 UNIT/ML MT SUSP: 5.0000 mL | Freq: Four times a day (QID) | OROMUCOSAL | 0 refills | Status: DC

## 2024-03-14 MED ORDER — LISINOPRIL-HYDROCHLOROTHIAZIDE 20-25 MG PO TABS: 1.0000 | ORAL_TABLET | Freq: Every day | ORAL | 1 refills | Status: DC

## 2024-03-14 MED ORDER — TIRZEPATIDE 12.5 MG/0.5ML ~~LOC~~ SOAJ: 12.5000 mg | SUBCUTANEOUS | 1 refills | Status: DC

## 2024-03-14 MED ORDER — CITALOPRAM HYDROBROMIDE 20 MG PO TABS: 20.0000 mg | ORAL_TABLET | Freq: Every day | ORAL | 1 refills | Status: DC

## 2024-03-14 MED ORDER — BUSPIRONE HCL 15 MG PO TABS: 15.0000 mg | ORAL_TABLET | Freq: Two times a day (BID) | ORAL | 5 refills | Status: DC

## 2024-03-14 NOTE — Progress Notes (Signed)
 Subjective:    Patient ID: Ellen Delgado, female    DOB: [birth year], 58 y.o.   MRN: [id number]   Chief Complaint: medical management of chronic issues     HPI:  Ellen Delgado is a 58 y.o. who identifies as a female who was assigned female at birth.   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 Essential hypertension No c/o chest pain, sob or headache. Does not check blood pressure at home. BP Readings from Last 3 Encounters:  10/05/23 125/76  03/23/23 122/75  12/29/22 106/65     2. Hyperlipidemia associated with type 2 diabetes mellitus Des try to watch diet but does no exercise. Lab Results  Component Value Date   CHOL 154 10/05/2023   HDL 68 10/05/2023   LDLCALC 62 10/05/2023   TRIG 143 10/05/2023   CHOLHDL 2.3 10/05/2023   The 10-year ASCVD risk score (Arnett DK, et al., 2019) is: 4.4%   3. Type 2 diabetes mellitus without complication, without long-term current use of insulin Fasting blood sugars are running around. No low blood sugars. Is on mounjario and would like to increase current dose. Lab Results  Component Value Date   HGBA1C 6.2 (H) 10/05/2023     4. Gastroesophageal reflux disease without esophagitis Uses OTC meds as needed  5. Depression, major, single episode, complete remission Is on celexa and is doing well.    10/05/2023   10:09 AM 03/23/2023   10:04 AM 12/29/2022   10:15 AM  Depression screen PHQ 2/9  Decreased Interest 0 0 0  Down, Depressed, Hopeless 0 0 0  PHQ - 2 Score 0 0 0  Altered sleeping 0 0 0  Tired, decreased energy 0 0 0  Change in appetite 0 0 0  Feeling bad or failure about yourself  0 0 0  Trouble concentrating 0 0 0  Moving slowly or fidgety/restless 0 0 0  Suicidal thoughts 0 0 0  PHQ-9 Score 0 0 0  Difficult doing work/chores Not difficult at all Not difficult at all Not difficult at all     6. Panic  attacks Takes buspar as needed.    03/14/2024   10:34 AM 10/05/2023   10:09 AM 12/29/2022   10:15 AM 06/16/2022   11:05 AM  GAD 7 : Generalized Anxiety Score  Nervous, Anxious, on Edge 2 0 0 0  Control/stop worrying 2 0 0 0  Worry too much - different things 2 0 0 0  Trouble relaxing 1 0 0 0  Restless 1 0 0 0  Easily annoyed or irritable 0 0 0 0  Afraid - awful might happen 0 0 0 0  Total GAD 7 Score 8 0 0 0  Anxiety Difficulty Somewhat difficult Not difficult at all Not difficult at all Not difficult at all       7. Primary insomnia Is on ambien to sleep and sleeps about 7-8 hours a night  8. Seizures Is on tegratol and has had no seizure activity in several years.  9. BMI 26.0-26.9,adult Weight is Down 4 lbs.   Wt Readings from Last 3 Encounters:  03/14/24 201 lb 12.8 oz (91.5 kg)  10/05/23 205 lb (93 kg)  03/23/23 210 lb (95.3 kg)   BMI Readings from Last 3 Encounters:  03/14/24 36.91 kg/m  10/05/23 37.49 kg/m  03/23/23 38.41 kg/m      New  complaints: Gums are burning. Certain foods irritate and make burning worse.  Allergies  Allergen Reactions   Tdap [Tetanus-Diphth-Acell Pertussis] Shortness Of Breath    sob   Outpatient Encounter Medications as of 03/14/2024  Medication Sig   Accu-Chek Softclix Lancets lancets Use as instructed   blood glucose meter kit and supplies KIT Checks blood sugars at home fating in morning and PRN   busPIRone (BUSPAR) 15 MG tablet TAKE ONE TABLET TWO TIMES DAILY.   calcium-vitamin D (OSCAL WITH D) 500-200 MG-UNIT tablet Take 3 tablets by mouth daily.   carbamazepine (TEGRETOL) 200 MG tablet TAKE ONE TABLET FOUR TIMES DAILY   carvedilol (COREG) 6.25 MG tablet TAKE ONE TABLET TWICE DAILY WITH A MEAL   celecoxib (CELEBREX) 400 MG capsule Take 1 capsule (400 mg total) by mouth daily after breakfast.   citalopram (CELEXA) 20 MG tablet Take 1 tablet (20 mg total) by mouth daily.   cyclobenzaprine (FLEXERIL) 10 MG tablet TAKE ONE  TABLET THREE TIMES DAILY.   glucose blood (ACCU-CHEK AVIVA PLUS) test strip Use to check blood sugar twice daily and prn   lisinopril-hydrochlorothiazide (ZESTORETIC) 20-25 MG tablet Take 1 tablet by mouth daily.   Multiple Vitamin (MULTIVITAMIN) tablet Take 1 tablet by mouth daily.   simvastatin (ZOCOR) 40 MG tablet take 1 tablet daily at 6 pm..   tirzepatide (MOUNJARO) 10 MG/0.5ML Pen Inject 10 mg into the skin once a week.   valACYclovir (VALTREX) 1000 MG tablet TAKE 2 TABLETS TWICE DAILY AT FEVER BLISTER ONSET.   zolpidem (AMBIEN) 10 MG tablet TAKE ONE TABLET BY MOUTH AT BEDTIME AS NEEDED FOR SLEEP   No facility-administered encounter medications on file as of 03/14/2024.    Past Surgical History:  Procedure Laterality Date   CHOLECYSTECTOMY      Family History  Problem Relation Age of Onset   Diabetes Mother    Diabetes Father    Coronary artery disease Father    Cancer Paternal Grandmother    Breast cancer Neg Hx       Controlled substance contract: n/a     Review of Systems  Constitutional:  Negative for diaphoresis.  Eyes:  Negative for pain.  Respiratory:  Negative for shortness of breath.   Cardiovascular:  Negative for chest pain, palpitations and leg swelling.  Gastrointestinal:  Negative for abdominal pain.  Endocrine: Negative for polydipsia.  Skin:  Negative for rash.  Neurological:  Negative for dizziness, weakness and headaches.  Hematological:  Does not bruise/bleed easily.  All other systems reviewed and are negative.      Objective:   Physical Exam Vitals and nursing note reviewed.  Constitutional:      General: She is not in acute distress.    Appearance: Normal appearance. She is well-developed.  HENT:     Head: Normocephalic.     Right Ear: Tympanic membrane normal.     Left Ear: Tympanic membrane normal.     Nose: Nose normal.     Mouth/Throat:     Mouth: Mucous membranes are moist.     Comments: White film on upper and lower  gums Multiple missing teeth with caries noted in front top teeth Eyes:     Pupils: Pupils are equal, round, and reactive to light.  Neck:     Vascular: No carotid bruit or JVD.  Cardiovascular:     Rate and Rhythm: Normal rate and regular rhythm.     Heart sounds: Normal heart sounds.  Pulmonary:     Effort:  Pulmonary effort is normal. No respiratory distress.     Breath sounds: Normal breath sounds. No wheezing or rales.  Chest:     Chest wall: No tenderness.  Abdominal:     General: Bowel sounds are normal. There is no distension or abdominal bruit.     Palpations: Abdomen is soft. There is no hepatomegaly, splenomegaly, mass or pulsatile mass.     Tenderness: There is no abdominal tenderness.  Musculoskeletal:        General: Normal range of motion.     Cervical back: Normal range of motion and neck supple.  Lymphadenopathy:     Cervical: No cervical adenopathy.  Skin:    General: Skin is warm and dry.  Neurological:     Mental Status: She is alert and oriented to person, place, and time.     Deep Tendon Reflexes: Reflexes are normal and symmetric.  Psychiatric:        Behavior: Behavior normal.        Thought Content: Thought content normal.        Judgment: Judgment normal.     BP 138/72   Temp 97.8 F (36.6 C) (Skin)   Ht 5\' 2"  (1.575 m)   Wt 201 lb 12.8 oz (91.5 kg)   BMI 36.91 kg/m   HGBA1c 6.1%     Assessment & Plan:   TIPHANY FAYSON comes in today with chief complaint of annual physical  Diagnosis and orders addressed:  1. Essential hypertension Low sodium diet - CBC with Differential/Platelet - CMP14+EGFR - carvedilol (COREG) 6.25 MG tablet; TAKE ONE TABLET TWICE DAILY WITH A MEAL  Dispense: 180 tablet; Refill: 1 - lisinopril-hydrochlorothiazide (ZESTORETIC) 20-25 MG tablet; Take 1 tablet by mouth daily.  Dispense: 90 tablet; Refill: 1  2. Hyperlipidemia associated with type 2 diabetes mellitus Low fat diet - Lipid panel - simvastatin (ZOCOR)  40 MG tablet; take 1 tablet daily at 6 pm..  Dispense: 90 tablet; Refill: 1  3. Type 2 diabetes mellitus treated with non insulin medication Continue to watch carbs in diet Increased mounjario to 12.5mg  weekly - Bayer DCA Hb A1c Waived - Microalbumin / creatinine urine ratio  4. Gastroesophageal reflux disease without esophagitis Avoid spicy foods Do not eat 2 hours prior to bedtime   5. Depression, major, single episode, complete remission Stress management - citalopram (CELEXA) 20 MG tablet; Take 1 tablet (20 mg total) by mouth daily.  Dispense: 90 tablet; Refill: 1  6. Panic attacks - busPIRone (BUSPAR) 15 MG tablet; Take 1 tablet (15 mg total) by mouth 2 (two) times daily.  Dispense: 60 tablet; Refill: 2  7. Primary insomnia Bedtime routine  8. Seizures Report any seizure activity  9. BMI 26.0-26.9,adult Discussed diet and exercise for person with BMI >25 Will recheck weight in 3-6 months  10. Oral candidiasis Nystatin as prescribed Needs to see dentist  Labs pending Health Maintenance reviewed Diet and exercise encouraged  Follow up plan: 6 months   Mary-Margaret Daphine Deutscher, FNP

## 2024-03-14 NOTE — Patient Instructions (Signed)
Oral Thrush, Adult Oral thrush is an infection in your mouth and throat and on your tongue. It causes white patches to form in your mouth and on your tongue. Many cases of thrush are mild. But, sometimes, thrush can be serious. People who have a weak body defense system (immune system) or other diseases can be affected more. What are the causes? This condition is caused by a type of fungus called yeast. The fungus is normally present in small amounts in the mouth and nose. If a person has a long-term illness or a weak body defense system, the fungus can grow and spread quickly. This causes thrush. What increases the risk? You are more likely to develop this condition if: You have a weak body defense system. You are an older adult. You have diabetes, cancer, or HIV. You have a dry mouth. You are pregnant or breastfeeding. You do not take good care of your teeth. This risk is greater for people who have false teeth (dentures). You use antibiotic or steroid medicines. What are the signs or symptoms? Symptoms of this condition include: A burning feeling in the mouth and throat. White patches that stick to the mouth and tongue. A bad taste in the mouth or trouble tasting foods. A feeling like you have cotton in your mouth. Pain when you eat and swallow. Not wanting to eat as much as usual. Cracking at the corners of the mouth. How is this treated? This condition is treated with medicines called antifungals. These medicines prevent a fungus from growing. The medicines are either put right on the area (topical) or swallowed (oral). Your doctor will also treat other problems that you may have, such as diabetes or HIV. Follow these instructions at home: Helping with pain and soreness To lessen your pain: Drink cold liquids, like water and iced tea. Eat frozen ice pops or frozen juices. Eat foods that are easy to swallow, like gelatin and ice cream. Drink from a straw if you have too much pain  in your mouth.  General instructions Take or use over-the-counter and prescription medicines only as told by your doctor. Eat plain yogurt that has live cultures in it. Read the label to make sure that there are live cultures in your yogurt. If you wear false teeth: Take them out before you go to bed. Brush them well. Soak them in a cleaner. Rinse your mouth with warm salt-water many times a day. To make the salt-water mixture, dissolve -1 teaspoon (3-6 g) of salt in 1 cup (237 mL) of warm water. Contact a doctor if: Your problems do not get better within 7 days of treatment. Your infection is spreading. This may show as white areas on the skin outside of your mouth. You are nursing your baby and you have redness and pain in the nipples. Summary Oral thrush is an infection in your mouth and throat. It is caused by a fungus. You are more likely to get this condition if you have a weak body defense system. Diseases like diabetes, cancer, or HIV also add to your risk. This condition is treated with medicines called antifungals. Contact a doctor if you do not get better within 7 days of starting treatment. This information is not intended to replace advice given to you by your health care provider. Make sure you discuss any questions you have with your health care provider. Document Revised: 11/13/2022 Document Reviewed: 11/13/2022 Elsevier Patient Education  2024 [employer].

## 2024-03-15 LAB — CBC WITH DIFFERENTIAL/PLATELET
Basophils Absolute: 0 10*3/uL (ref 0.0–0.2)
Basos: 1 %
EOS (ABSOLUTE): 0.4 10*3/uL (ref 0.0–0.4)
Eos: 6 %
Hematocrit: 44.5 % (ref 34.0–46.6)
Hemoglobin: 15.1 g/dL (ref 11.1–15.9)
Immature Grans (Abs): 0 10*3/uL (ref 0.0–0.1)
Immature Granulocytes: 0 %
Lymphocytes Absolute: 1.9 10*3/uL (ref 0.7–3.1)
Lymphs: 30 %
MCH: 31.9 pg (ref 26.6–33.0)
MCHC: 33.9 g/dL (ref 31.5–35.7)
MCV: 94 fL (ref 79–97)
Monocytes Absolute: 0.7 10*3/uL (ref 0.1–0.9)
Monocytes: 11 %
Neutrophils Absolute: 3.3 10*3/uL (ref 1.4–7.0)
Neutrophils: 52 %
Platelets: 291 10*3/uL (ref 150–450)
RBC: 4.73 x10E6/uL (ref 3.77–5.28)
RDW: 12 % (ref 11.7–15.4)
WBC: 6.3 10*3/uL (ref 3.4–10.8)

## 2024-03-15 LAB — CMP14+EGFR
ALT: 25 IU/L (ref 0–32)
AST: 25 IU/L (ref 0–40)
Albumin: 4.3 g/dL (ref 3.8–4.9)
Alkaline Phosphatase: 84 IU/L (ref 44–121)
BUN/Creatinine Ratio: 18 (ref 9–23)
BUN: 14 mg/dL (ref 6–24)
Bilirubin Total: 0.2 mg/dL (ref 0.0–1.2)
CO2: 27 mmol/L (ref 20–29)
Calcium: 10.8 mg/dL — ABNORMAL HIGH (ref 8.7–10.2)
Chloride: 94 mmol/L — ABNORMAL LOW (ref 96–106)
Creatinine, Ser: 0.8 mg/dL (ref 0.57–1.00)
Globulin, Total: 3.2 g/dL (ref 1.5–4.5)
Glucose: 105 mg/dL — ABNORMAL HIGH (ref 70–99)
Potassium: 4.2 mmol/L (ref 3.5–5.2)
Sodium: 136 mmol/L (ref 134–144)
Total Protein: 7.5 g/dL (ref 6.0–8.5)
eGFR: 85 mL/min/{1.73_m2} (ref >59)

## 2024-03-15 LAB — LIPID PANEL
Chol/HDL Ratio: 2.5 ratio (ref 0.0–4.4)
Cholesterol, Total: 151 mg/dL (ref 100–199)
HDL: 61 mg/dL (ref >39)
LDL Chol Calc (NIH): 64 mg/dL (ref 0–99)
Triglycerides: 151 mg/dL — ABNORMAL HIGH (ref 0–149)
VLDL Cholesterol Cal: 26 mg/dL (ref 5–40)

## 2024-03-17 LAB — TOXASSURE SELECT 13 (MW), URINE

## 2024-03-19 ENCOUNTER — Other Ambulatory Visit: Payer: Self-pay | Admitting: *Deleted

## 2024-03-19 ENCOUNTER — Other Ambulatory Visit: Payer: Self-pay | Admitting: Nurse Practitioner

## 2024-03-19 ENCOUNTER — Telehealth: Payer: Self-pay

## 2024-03-19 MED ORDER — NYSTATIN 100000 UNIT/ML MT SUSP: 5.0000 mL | Freq: Four times a day (QID) | OROMUCOSAL | 1 refills | Status: DC

## 2024-03-19 NOTE — Telephone Encounter (Signed)
 Yes go ahead and refill the nystatin for her

## 2024-03-19 NOTE — Telephone Encounter (Signed)
Refills sent. Pt aware. 

## 2024-03-19 NOTE — Telephone Encounter (Signed)
 Copied from CRM [id number]. Topic: Clinical - Medication Refill >> Mar 19, 2024  4:09 PM Sonny Dandy B wrote: Most Recent Primary Care Visit:   Medication:nystatin (MYCOSTATIN) 100000 UNIT/ML suspension  Has the patient contacted their pharmacy? Yes (Agent: If no, request that the patient contact the pharmacy for the refill. If patient does not wish to contact the pharmacy document the reason why and proceed with request.) (Agent: If yes, when and what did the pharmacy advise?)  Is this the correct pharmacy for this prescription? Yes If no, delete pharmacy and type the correct one.  This is the patient's preferred pharmacy:  [facility] [place], [place] - [address] [place] [address] Phone: [phone] Fax: [phone]   Has the prescription been filled recently? Yes  Is the patient out of the medication? Yes  Has the patient been seen for an appointment in the last year OR does the patient have an upcoming appointment? Yes  Can we respond through MyChart? Yes  Agent: Please be advised that Rx refills may take up to 3 business days. We ask that you follow-up with your pharmacy.

## 2024-03-19 NOTE — Telephone Encounter (Signed)
Medication sent to patient's pharmacy. Patient notified. 

## 2024-03-19 NOTE — Telephone Encounter (Signed)
 Copied from CRM [id number]. Topic: Clinical - Medication Question >> Mar 19, 2024 10:43 AM Ellen Delgado wrote: Reason for CRM: nystatin (MYCOSTATIN) 100000 UNIT/ML suspension- ran out of this medication and is asking if she can get more until she can get to the dentist- [phone]

## 2024-03-26 ENCOUNTER — Other Ambulatory Visit: Payer: Self-pay | Admitting: Family Medicine

## 2024-03-27 ENCOUNTER — Ambulatory Visit: Payer: Medicaid Other | Admitting: Nurse Practitioner

## 2024-03-28 ENCOUNTER — Ambulatory Visit: Payer: Medicaid Other

## 2024-04-01 ENCOUNTER — Other Ambulatory Visit: Payer: Self-pay | Admitting: Nurse Practitioner

## 2024-04-01 DIAGNOSIS — G8929 Other chronic pain: Secondary | ICD-10-CM

## 2024-04-03 ENCOUNTER — Ambulatory Visit: Payer: Medicaid Other | Admitting: Nurse Practitioner

## 2024-04-07 ENCOUNTER — Ambulatory Visit: Payer: Medicaid Other | Admitting: Nurse Practitioner

## 2024-04-08 ENCOUNTER — Other Ambulatory Visit: Payer: Self-pay | Admitting: Nurse Practitioner

## 2024-04-08 DIAGNOSIS — M51369 Other intervertebral disc degeneration, lumbar region without mention of lumbar back pain or lower extremity pain: Secondary | ICD-10-CM

## 2024-05-07 ENCOUNTER — Other Ambulatory Visit: Payer: Self-pay | Admitting: Nurse Practitioner

## 2024-05-07 DIAGNOSIS — M51369 Other intervertebral disc degeneration, lumbar region without mention of lumbar back pain or lower extremity pain: Secondary | ICD-10-CM

## 2024-05-07 DIAGNOSIS — R569 Unspecified convulsions: Secondary | ICD-10-CM

## 2024-05-16 ENCOUNTER — Telehealth: Payer: Self-pay | Admitting: Family Medicine

## 2024-05-16 DIAGNOSIS — Z7985 Long-term (current) use of injectable non-insulin antidiabetic drugs: Secondary | ICD-10-CM

## 2024-05-16 NOTE — Telephone Encounter (Signed)
 PAtient wants to move up to the mounjaro  15mg 

## 2024-05-19 MED ORDER — TIRZEPATIDE 15 MG/0.5ML ~~LOC~~ SOAJ: 15.0000 mg | SUBCUTANEOUS | 1 refills | Status: DC

## 2024-05-19 NOTE — Addendum Note (Signed)
 Addended by: Teruko Joswick, MARY-MARGARET on: 05/19/2024 12:06 PM   Modules accepted: Orders

## 2024-05-28 ENCOUNTER — Telehealth: Payer: Self-pay | Admitting: Nurse Practitioner

## 2024-05-28 NOTE — Telephone Encounter (Signed)
 Called patient to make Diabetic Eye Exam and she needs to come on a Friday. Will call back in August to see if we have anything on  Frida.

## 2024-05-29 ENCOUNTER — Encounter: Payer: Self-pay | Admitting: *Deleted

## 2024-06-16 ENCOUNTER — Other Ambulatory Visit: Payer: Self-pay | Admitting: Nurse Practitioner

## 2024-06-16 DIAGNOSIS — M51369 Other intervertebral disc degeneration, lumbar region without mention of lumbar back pain or lower extremity pain: Secondary | ICD-10-CM

## 2024-06-16 NOTE — Telephone Encounter (Signed)
 Last OV 03/19/2024. Last RF 05/08/2024. Next OV 09/05/2024

## 2024-07-29 ENCOUNTER — Other Ambulatory Visit: Payer: Self-pay | Admitting: Nurse Practitioner

## 2024-07-29 DIAGNOSIS — G8929 Other chronic pain: Secondary | ICD-10-CM

## 2024-08-20 DIAGNOSIS — E1169 Type 2 diabetes mellitus with other specified complication: Secondary | ICD-10-CM | POA: Diagnosis not present

## 2024-08-20 DIAGNOSIS — L02212 Cutaneous abscess of back [any part, except buttock]: Secondary | ICD-10-CM | POA: Diagnosis not present

## 2024-08-24 ENCOUNTER — Other Ambulatory Visit: Payer: Self-pay | Admitting: Nurse Practitioner

## 2024-08-24 DIAGNOSIS — R569 Unspecified convulsions: Secondary | ICD-10-CM

## 2024-08-29 ENCOUNTER — Ambulatory Visit: Admitting: Nurse Practitioner

## 2024-09-02 ENCOUNTER — Ambulatory Visit: Admitting: Nurse Practitioner

## 2024-09-03 DIAGNOSIS — L02212 Cutaneous abscess of back [any part, except buttock]: Secondary | ICD-10-CM | POA: Diagnosis not present

## 2024-09-05 ENCOUNTER — Encounter: Payer: Self-pay | Admitting: Nurse Practitioner

## 2024-09-05 ENCOUNTER — Ambulatory Visit: Admitting: Nurse Practitioner

## 2024-09-05 VITALS — BP 99/69 | HR 77 | Temp 97.6°F | Ht 62.0 in | Wt 181.0 lb

## 2024-09-05 DIAGNOSIS — E785 Hyperlipidemia, unspecified: Secondary | ICD-10-CM | POA: Diagnosis not present

## 2024-09-05 DIAGNOSIS — Z23 Encounter for immunization: Secondary | ICD-10-CM

## 2024-09-05 DIAGNOSIS — E1169 Type 2 diabetes mellitus with other specified complication: Secondary | ICD-10-CM | POA: Diagnosis not present

## 2024-09-05 DIAGNOSIS — E119 Type 2 diabetes mellitus without complications: Secondary | ICD-10-CM

## 2024-09-05 DIAGNOSIS — F5101 Primary insomnia: Secondary | ICD-10-CM

## 2024-09-05 DIAGNOSIS — F325 Major depressive disorder, single episode, in full remission: Secondary | ICD-10-CM | POA: Diagnosis not present

## 2024-09-05 DIAGNOSIS — G8929 Other chronic pain: Secondary | ICD-10-CM

## 2024-09-05 DIAGNOSIS — K219 Gastro-esophageal reflux disease without esophagitis: Secondary | ICD-10-CM | POA: Diagnosis not present

## 2024-09-05 DIAGNOSIS — F410 Panic disorder [episodic paroxysmal anxiety] without agoraphobia: Secondary | ICD-10-CM

## 2024-09-05 DIAGNOSIS — I10 Essential (primary) hypertension: Secondary | ICD-10-CM | POA: Diagnosis not present

## 2024-09-05 DIAGNOSIS — R569 Unspecified convulsions: Secondary | ICD-10-CM

## 2024-09-05 DIAGNOSIS — B001 Herpesviral vesicular dermatitis: Secondary | ICD-10-CM

## 2024-09-05 DIAGNOSIS — Z7985 Long-term (current) use of injectable non-insulin antidiabetic drugs: Secondary | ICD-10-CM | POA: Diagnosis not present

## 2024-09-05 DIAGNOSIS — Z6826 Body mass index (BMI) 26.0-26.9, adult: Secondary | ICD-10-CM | POA: Diagnosis not present

## 2024-09-05 DIAGNOSIS — M545 Low back pain, unspecified: Secondary | ICD-10-CM | POA: Diagnosis not present

## 2024-09-05 LAB — LIPID PANEL
Chol/HDL Ratio: 2.5 ratio (ref 0.0–4.4)
Cholesterol, Total: 145 mg/dL (ref 100–199)
HDL: 58 mg/dL (ref >39)
LDL Chol Calc (NIH): 66 mg/dL (ref 0–99)
Triglycerides: 115 mg/dL (ref 0–149)
VLDL Cholesterol Cal: 21 mg/dL (ref 5–40)

## 2024-09-05 LAB — COMPREHENSIVE METABOLIC PANEL WITH GFR
ALT: 26 IU/L (ref 0–32)
AST: 24 IU/L (ref 0–40)
Albumin: 3.8 g/dL (ref 3.8–4.9)
Alkaline Phosphatase: 69 IU/L (ref 49–135)
BUN/Creatinine Ratio: 24 — ABNORMAL HIGH (ref 9–23)
BUN: 21 mg/dL (ref 6–24)
Bilirubin Total: 0.3 mg/dL (ref 0.0–1.2)
CO2: 24 mmol/L (ref 20–29)
Calcium: 10.2 mg/dL (ref 8.7–10.2)
Chloride: 98 mmol/L (ref 96–106)
Creatinine, Ser: 0.88 mg/dL (ref 0.57–1.00)
Globulin, Total: 2.4 g/dL (ref 1.5–4.5)
Glucose: 91 mg/dL (ref 70–99)
Potassium: 4.5 mmol/L (ref 3.5–5.2)
Sodium: 136 mmol/L (ref 134–144)
Total Protein: 6.2 g/dL (ref 6.0–8.5)
eGFR: 76 mL/min/1.73 (ref >59)

## 2024-09-05 LAB — CBC WITH DIFFERENTIAL/PLATELET
Basophils Absolute: 0 x10E3/uL (ref 0.0–0.2)
Basos: 1 %
EOS (ABSOLUTE): 0.2 x10E3/uL (ref 0.0–0.4)
Eos: 5 %
Hematocrit: 38.8 % (ref 34.0–46.6)
Hemoglobin: 12.8 g/dL (ref 11.1–15.9)
Immature Grans (Abs): 0 x10E3/uL (ref 0.0–0.1)
Immature Granulocytes: 0 %
Lymphocytes Absolute: 1.2 x10E3/uL (ref 0.7–3.1)
Lymphs: 29 %
MCH: 31.8 pg (ref 26.6–33.0)
MCHC: 33 g/dL (ref 31.5–35.7)
MCV: 97 fL (ref 79–97)
Monocytes Absolute: 0.5 x10E3/uL (ref 0.1–0.9)
Monocytes: 12 %
Neutrophils Absolute: 2.2 x10E3/uL (ref 1.4–7.0)
Neutrophils: 53 %
Platelets: 274 x10E3/uL (ref 150–450)
RBC: 4.02 x10E6/uL (ref 3.77–5.28)
RDW: 12 % (ref 11.7–15.4)
WBC: 4.2 x10E3/uL (ref 3.4–10.8)

## 2024-09-05 LAB — BAYER DCA HB A1C WAIVED: HB A1C (BAYER DCA - WAIVED): 5.6 % (ref 4.8–5.6)

## 2024-09-05 MED ORDER — LISINOPRIL-HYDROCHLOROTHIAZIDE 20-25 MG PO TABS: 1.0000 | ORAL_TABLET | Freq: Every day | ORAL | 1 refills | Status: DC

## 2024-09-05 MED ORDER — TIRZEPATIDE 15 MG/0.5ML ~~LOC~~ SOAJ: 15.0000 mg | SUBCUTANEOUS | 1 refills | Status: DC

## 2024-09-05 MED ORDER — VALACYCLOVIR HCL 1 G PO TABS: ORAL_TABLET | ORAL | 1 refills | Status: AC

## 2024-09-05 MED ORDER — CITALOPRAM HYDROBROMIDE 20 MG PO TABS: 20.0000 mg | ORAL_TABLET | Freq: Every day | ORAL | 1 refills | Status: DC

## 2024-09-05 MED ORDER — CELECOXIB 200 MG PO CAPS
200.0000 mg | ORAL_CAPSULE | Freq: Two times a day (BID) | ORAL | 5 refills | Status: DC
Start: 2024-09-05 — End: 2024-09-24

## 2024-09-05 MED ORDER — CARVEDILOL 6.25 MG PO TABS
ORAL_TABLET | ORAL | 1 refills | Status: DC
Start: 2024-09-05 — End: 2024-10-21

## 2024-09-05 MED ORDER — CARBAMAZEPINE 200 MG PO TABS: 200.0000 mg | ORAL_TABLET | Freq: Four times a day (QID) | ORAL | 0 refills | Status: DC

## 2024-09-05 MED ORDER — BUSPIRONE HCL 15 MG PO TABS
15.0000 mg | ORAL_TABLET | Freq: Two times a day (BID) | ORAL | 5 refills | Status: DC
Start: 2024-09-05 — End: 2024-10-21

## 2024-09-05 MED ORDER — SIMVASTATIN 40 MG PO TABS: ORAL_TABLET | ORAL | 1 refills | Status: DC

## 2024-09-05 MED ORDER — ZOLPIDEM TARTRATE 10 MG PO TABS: ORAL_TABLET | ORAL | 5 refills | Status: AC

## 2024-09-05 NOTE — Patient Instructions (Signed)
Exercise Information for Aging Adults Staying physically active is important as you age. Physical activity and exercise can help in maintaining quality of life, health, physical function, and reducing falls. The four types of exercises that are best for older adults are endurance, strength, balance, and flexibility. Contact your health care provider before you start any exercise routine. Ask your health care provider what activities are safe for you. What are the risks? Risks associated with exercising include: Overdoing it. This may lead to sore muscles or fatigue. Falls. Injuries. Dehydration. How to do these exercises Endurance exercises Endurance (aerobic) exercises raise your breathing rate and heart rate. Increasing your endurance helps you do everyday tasks and stay healthy. By improving the health of your body system that includes your heart, lungs, and blood vessels (circulatory system), you may also delay or prevent diseases such as heart disease, diabetes, and weak bones (osteoporosis). Types of endurance exercises include: Sports. Indoor activities, such as using gym equipment, doing water aerobics, or dancing. Outdoor activities, such as biking or jogging. Tasks around the house, such as gardening, yard work, and heavy household chores like cleaning. Walking, such as hiking or walking around your neighborhood. When doing endurance exercises, make sure you: Are aware of your surroundings. Use safety equipment as directed. Dress in layers when exercising outdoors. Drink plenty of water to stay well hydrated. Build up endurance slowly. Start with 10 minutes at a time, and gradually build up to doing 30 minutes at a time. Unless your health care provider gave you different instructions, aim to exercise for a total of 150 minutes a week. Spread out that time so you are working on endurance 3 or more days a week. Strength exercises Lifting, pulling, or pushing weights helps to  strengthen muscles. Having stronger muscles makes it easier to do everyday activities, such as getting up from a chair, climbing stairs, carrying groceries, and playing with grandchildren. Strength exercises include arm and leg exercises that may be done: With weights. Without weights (using your own body weight). With a resistance band. When doing strength exercises: Move smoothly and steadily. Do not suddenly thrust or jerk the weights, the resistance band, or your body. Start with no weights or with light weights, and gradually add more weight over time. Eventually, aim to use weights that are hard or very hard for you to lift. This means that you are able to do 8 repetitions with the weight, and the last few repetitions are very challenging. Lift or push weights into position for 3 seconds, hold the position for 1 second, and then take 3 seconds to return to your starting position. Breathe out (exhale) during difficult movements, like lifting or pushing weights. Breathe in (inhale) to relax your muscles before the next repetition. Consider alternating arms or legs, especially when you first start strength exercises. Expect some slight muscle soreness after each session. Do strength exercises on 2 or more days a week, for 30 minutes at a time. Avoid exercising the same muscle groups two days in a row. For example, if you work on your leg muscles one day, work on your arm muscles the next day. When you can do two sets of 10-15 repetitions with a certain weight, increase the amount of weight. Balance exercises Balance exercises can help to prevent falls. Balance exercises include: Standing on one foot. Heel-to-toe walk. Balance walk. Tai chi. Make sure you have something sturdy to hold onto while doing balance exercises, such as a sturdy chair. As your balance   improves, challenge yourself by holding on to the chair with one hand instead of two, and then with no hands. Trying exercises with your  eyes closed also challenges your balance, but be sure to have a sturdy surface (like a countertop) close by in case you need it. Do balance exercises as often as you want, or as often as directed by your health care provider. Flexibility exercises  Flexibility exercises improve how far you can bend, straighten, move, or rotate parts of your body (range of motion). These exercises also help you do everyday activities such as getting dressed or reaching for objects. Flexibility exercises include stretching different parts of the body, and they may be done in a standing or seated position or on the floor. When stretching, make sure you: Keep a slight bend in your arms and legs. Avoid completely straightening ("locking") your joints. Do not stretch so far that you feel pain. You should feel a mild stretching feeling. You may try stretching farther as you become more flexible over time. Relax and breathe between stretches. Hold on to something sturdy for balance as needed. Hold each stretch for 10-30 seconds. Repeat each stretch 3-5 times. General safety tips Exercise in well-lit areas. Do not hold your breath during exercises or stretches. Warm up before exercising, and cool down after exercising. This can help prevent injury. Drink plenty of water during exercise or any activity that makes you sweat. If you are not sure if an exercise is safe for you, or you are not sure how to do an exercise, talk with your health care provider. This is especially important if you have had surgery on muscles, bones, or joints (orthopedic surgery). Where to find more information You can find more information about exercise for older adults from: Your local health department, fitness center, or community center. These facilities may have programs for aging adults. National Institute on Aging: www.nia.nih.gov National Council on Aging: www.ncoa.org Summary Staying physically active is important as you age. Doing  endurance, strength, balance, and flexibility exercises can help in maintaining quality of life, health, physical function, and reducing falls. Make sure to contact your health care provider before you start any exercise routine. Ask your health care provider what activities are safe for you. This information is not intended to replace advice given to you by your health care provider. Make sure you discuss any questions you have with your health care provider. Document Revised: 04/11/2021 Document Reviewed: 04/11/2021 Elsevier Patient Education  2024 [employer] Inc[employer]  

## 2024-09-05 NOTE — Progress Notes (Signed)
 Subjective:    Patient ID: Ellen Delgado, female    DOB: March 20, 1966, 58 y.o.   MRN: [id number]   Chief Complaint: medical management of chronic issues     HPI:  Ellen Delgado is a 58 y.o. who identifies as a female who was assigned female at birth.   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 Essential hypertension No c/o chest pain, sob or headache. Does not check blood pressure at home. BP Readings from Last 3 Encounters:  09/05/24 99/69  03/14/24 138/72  10/05/23 125/76     2. Hyperlipidemia associated with type 2 diabetes mellitus Des try to watch diet but does no exercise. Lab Results  Component Value Date   CHOL 151 03/14/2024   HDL 61 03/14/2024   LDLCALC 64 03/14/2024   TRIG 151 (H) 03/14/2024   CHOLHDL 2.5 03/14/2024   The 10-year ASCVD risk score (Arnett DK, et al., 2019) is: 3%   3. Type 2 diabetes mellitus without complication, without long-term current use of insulin Fasting blood sugars are running around. No low blood sugars. Is on mounjario and would like to increase current dose. Lab Results  Component Value Date   HGBA1C 6.1 (H) 03/14/2024     4. Gastroesophageal reflux disease without esophagitis Uses OTC meds as needed  5. Depression, major, single episode, complete remission Is on celexa  and is doing well.    09/05/2024   10:55 AM 03/14/2024   10:54 AM 10/05/2023   10:09 AM  Depression screen PHQ 2/9  Decreased Interest 0 0 0  Down, Depressed, Hopeless 0 0 0  PHQ - 2 Score 0 0 0  Altered sleeping 0  0  Tired, decreased energy 0  0  Change in appetite 0  0  Feeling bad or failure about yourself  0  0  Trouble concentrating 0  0  Moving slowly or fidgety/restless 0  0  Suicidal thoughts 0  0  PHQ-9 Score 0  0  Difficult doing work/chores Not difficult at all  Not difficult at all     6. Panic attacks Takes buspar  as  needed.    09/05/2024   10:55 AM 03/14/2024   10:54 AM 03/14/2024   10:34 AM 10/05/2023   10:09 AM  GAD 7 : Generalized Anxiety Score  Nervous, Anxious, on Edge 0 0 2 0  Control/stop worrying 0 0 2 0  Worry too much - different things 0 0 2 0  Trouble relaxing 0 0 1 0  Restless 0 0 1 0  Easily annoyed or irritable 0 0 0 0  Afraid - awful might happen 0 0 0 0  Total GAD 7 Score 0 0 8 0  Anxiety Difficulty Not difficult at all Not difficult at all Somewhat difficult Not difficult at all       7. Primary insomnia Is on ambien  to sleep and sleeps about 7-8 hours a night  8. Seizures Is on tegratol and has had no seizure activity in several years.  9. BMI 26.0-26.9,adult Weight is Down 19lbs  Wt Readings from Last 3 Encounters:  09/05/24 181 lb (82.1 kg)  03/14/24 201 lb 12.8 oz (91.5 kg)  10/05/23 205 lb (93 kg)   BMI Readings from Last 3 Encounters:  09/05/24 33.11 kg/m  03/14/24 36.91 kg/m  10/05/23 37.49 kg/m      New complaints: None today  Allergies  Allergen Reactions   Tdap [Tetanus-Diphth-Acell Pertussis] Shortness Of Breath    sob   Outpatient Encounter Medications as of 09/05/2024  Medication Sig   Accu-Chek Softclix Lancets lancets Use as instructed   blood glucose meter kit and supplies KIT Checks blood sugars at home fating in morning and PRN   busPIRone  (BUSPAR ) 15 MG tablet Take 1 tablet (15 mg total) by mouth 2 (two) times daily.   calcium-vitamin D  (OSCAL WITH D) 500-200 MG-UNIT tablet Take 3 tablets by mouth daily.   carbamazepine  (TEGRETOL ) 200 MG tablet TAKE ONE TABLET BY MOUTH FOUR TIMES DAILY   carvedilol  (COREG ) 6.25 MG tablet TAKE ONE TABLET TWICE DAILY WITH A MEAL   celecoxib  (CELEBREX ) 200 MG capsule TAKE TWO CAPSULES (400 MG.) BY MOUTH DAILY AFTER BREAKFAST.   citalopram  (CELEXA ) 20 MG tablet Take 1 tablet (20 mg total) by mouth daily.   cyclobenzaprine  (FLEXERIL ) 10 MG tablet TAKE ONE TABLET THREE TIMES DAILY.   glucose blood  (ACCU-CHEK AVIVA PLUS) test strip Use to check blood sugar twice daily and prn   lisinopril -hydrochlorothiazide  (ZESTORETIC ) 20-25 MG tablet Take 1 tablet by mouth daily.   Multiple Vitamin (MULTIVITAMIN) tablet Take 1 tablet by mouth daily.   nystatin  (MYCOSTATIN ) 100000 UNIT/ML suspension TAKE ONE TEASPOONFUL ( .) BY MOUTH FOUR TIMES DAILY   simvastatin  (ZOCOR ) 40 MG tablet take 1 tablet daily at 6 pm..   tirzepatide  (MOUNJARO ) 15 MG/0.5ML Pen Inject 15 mg into the skin once a week.   valACYclovir  (VALTREX ) 1000 MG tablet TAKE 2 TABLETS TWICE DAILY AT FEVER BLISTER ONSET.   zolpidem  (AMBIEN ) 10 MG tablet TAKE ONE TABLET BY MOUTH AT BEDTIME AS NEEDED FOR SLEEP   No facility-administered encounter medications on file as of 09/05/2024.    Past Surgical History:  Procedure Laterality Date   CHOLECYSTECTOMY      Family History  Problem Relation Age of Onset   Diabetes Mother    Diabetes Father    Coronary artery disease Father    Cancer Paternal Grandmother    Breast cancer Neg Hx       Controlled substance contract: n/a     Review of Systems  Constitutional:  Negative for diaphoresis.  Eyes:  Negative for pain.  Respiratory:  Negative for shortness of breath.   Cardiovascular:  Negative for chest pain, palpitations and leg swelling.  Gastrointestinal:  Negative for abdominal pain.  Endocrine: Negative for polydipsia.  Skin:  Negative for rash.  Neurological:  Negative for dizziness, weakness and headaches.  Hematological:  Does not bruise/bleed easily.  All other systems reviewed and are negative.      Objective:   Physical Exam Vitals and nursing note reviewed.  Constitutional:      General: She is not in acute distress.    Appearance: Normal appearance. She is well-developed.  HENT:     Head: Normocephalic.     Right Ear: Tympanic membrane normal.     Left Ear: Tympanic membrane normal.     Nose: Nose normal.     Mouth/Throat:     Mouth: Mucous membranes  are moist.     Comments: White film on upper and lower gums Multiple missing teeth with caries noted in front top teeth Eyes:     Pupils: Pupils are equal, round, and reactive to light.  Neck:     Vascular: No carotid bruit or JVD.  Cardiovascular:     Rate and Rhythm: Normal rate and regular rhythm.     Heart  sounds: Normal heart sounds.  Pulmonary:     Effort: Pulmonary effort is normal. No respiratory distress.     Breath sounds: Normal breath sounds. No wheezing or rales.  Chest:     Chest wall: No tenderness.  Abdominal:     General: Bowel sounds are normal. There is no distension or abdominal bruit.     Palpations: Abdomen is soft. There is no hepatomegaly, splenomegaly, mass or pulsatile mass.     Tenderness: There is no abdominal tenderness.  Musculoskeletal:        General: Normal range of motion.     Cervical back: Normal range of motion and neck supple.  Lymphadenopathy:     Cervical: No cervical adenopathy.  Skin:    General: Skin is warm and dry.  Neurological:     Mental Status: She is alert and oriented to person, place, and time.     Deep Tendon Reflexes: Reflexes are normal and symmetric.  Psychiatric:        Behavior: Behavior normal.        Thought Content: Thought content normal.        Judgment: Judgment normal.     BP 99/69   Pulse 77   Temp 97.6 F (36.4 C)   Ht 5' 2 (1.575 m)   Wt 181 lb (82.1 kg)   SpO2 100%   BMI 33.11 kg/m   HGBA1c 5.6%     Assessment & Plan:   Ellen Delgado comes in today with chief complaint of annual physical  Diagnosis and orders addressed:  1. Essential hypertension Low sodium diet - CBC with Differential/Platelet - CMP14+EGFR - carvedilol  (COREG ) 6.25 MG tablet; TAKE ONE TABLET TWICE DAILY WITH A MEAL  Dispense: 180 tablet; Refill: 1 - lisinopril -hydrochlorothiazide  (ZESTORETIC ) 20-25 MG tablet; Take 1 tablet by mouth daily.  Dispense: 90 tablet; Refill: 1  2. Hyperlipidemia associated with type 2  diabetes mellitus Low fat diet - Lipid panel - simvastatin  (ZOCOR ) 40 MG tablet; take 1 tablet daily at 6 pm..  Dispense: 90 tablet; Refill: 1  3. Type 2 diabetes mellitus treated with non insulin medication Continue to watch carbs in diet Increased mounjario to 12.5mg  weekly - Bayer DCA Hb A1c Waived - Microalbumin / creatinine urine ratio  4. Gastroesophageal reflux disease without esophagitis Avoid spicy foods Do not eat 2 hours prior to bedtime   5. Depression, major, single episode, complete remission Stress management - citalopram  (CELEXA ) 20 MG tablet; Take 1 tablet (20 mg total) by mouth daily.  Dispense: 90 tablet; Refill: 1  6. Panic attacks - busPIRone  (BUSPAR ) 15 MG tablet; Take 1 tablet (15 mg total) by mouth 2 (two) times daily.  Dispense: 60 tablet; Refill: 2  7. Primary insomnia Bedtime routine  8. Seizures Report any seizure activity  9. BMI 26.0-26.9,adult Discussed diet and exercise for person with BMI >25 Will recheck weight in 3-6 months  Labs pending Health Maintenance reviewed Diet and exercise encouraged  Follow up plan: 6 months   Mary-Margaret Gladis, FNP

## 2024-09-05 NOTE — Addendum Note (Signed)
 Addended by: GLADIS MUSTARD on: 09/05/2024 11:52 AM   Modules accepted: Level of Service

## 2024-09-08 ENCOUNTER — Ambulatory Visit: Payer: Self-pay | Admitting: Nurse Practitioner

## 2024-09-14 ENCOUNTER — Other Ambulatory Visit: Payer: Self-pay | Admitting: Nurse Practitioner

## 2024-09-14 DIAGNOSIS — M51369 Other intervertebral disc degeneration, lumbar region without mention of lumbar back pain or lower extremity pain: Secondary | ICD-10-CM

## 2024-09-15 ENCOUNTER — Other Ambulatory Visit (HOSPITAL_COMMUNITY): Payer: Self-pay

## 2024-09-16 ENCOUNTER — Telehealth: Payer: Self-pay | Admitting: Pharmacy Technician

## 2024-09-16 NOTE — Telephone Encounter (Signed)
 Pharmacy Patient Advocate Encounter   Received notification from CoverMyMeds that prior authorization for Mounjaro  2.5MG /0.5ML auto-injectors is due for renewal.   Insurance verification completed.   The patient is insured through [facility].  Action: Medication has been discontinued. Archived Key: [id number]  **Patient on higher dosage.**

## 2024-09-24 ENCOUNTER — Other Ambulatory Visit: Payer: Self-pay | Admitting: Nurse Practitioner

## 2024-09-24 DIAGNOSIS — M545 Low back pain, unspecified: Secondary | ICD-10-CM

## 2024-10-21 ENCOUNTER — Other Ambulatory Visit: Payer: Self-pay | Admitting: Nurse Practitioner

## 2024-10-21 DIAGNOSIS — F410 Panic disorder [episodic paroxysmal anxiety] without agoraphobia: Secondary | ICD-10-CM

## 2024-10-21 DIAGNOSIS — E1169 Type 2 diabetes mellitus with other specified complication: Secondary | ICD-10-CM

## 2024-10-21 DIAGNOSIS — F325 Major depressive disorder, single episode, in full remission: Secondary | ICD-10-CM

## 2024-10-21 DIAGNOSIS — I10 Essential (primary) hypertension: Secondary | ICD-10-CM

## 2024-10-27 ENCOUNTER — Telehealth: Payer: Self-pay | Admitting: Pharmacy Technician

## 2024-10-27 ENCOUNTER — Other Ambulatory Visit: Payer: Self-pay | Admitting: *Deleted

## 2024-10-27 ENCOUNTER — Other Ambulatory Visit (HOSPITAL_COMMUNITY): Payer: Self-pay

## 2024-10-27 DIAGNOSIS — R569 Unspecified convulsions: Secondary | ICD-10-CM

## 2024-10-27 DIAGNOSIS — M51369 Other intervertebral disc degeneration, lumbar region without mention of lumbar back pain or lower extremity pain: Secondary | ICD-10-CM

## 2024-10-27 NOTE — Telephone Encounter (Signed)
 Pharmacy Patient Advocate Encounter   Received notification from Onbase that prior authorization for Mounjaro  15MG /0.5ML auto-injectors  is required/requested.   Insurance verification completed.   The patient is insured through [facility] MEDICAID.   Per test claim: PA required; PA submitted to above mentioned insurance via Latent Key/confirmation #/EOC [id number] Status is pending

## 2024-10-28 ENCOUNTER — Other Ambulatory Visit (HOSPITAL_COMMUNITY): Payer: Self-pay

## 2024-10-28 NOTE — Telephone Encounter (Signed)
 Pharmacy Patient Advocate Encounter  Received notification from [facility] MEDICAID that Prior Authorization for Mounjaro  15MG /0.5ML auto-injectors  has been APPROVED from 10/27/24 to 10/27/25. Ran test claim, Copay is $4.00. This test claim was processed through [facility]- copay amounts may vary at other pharmacies due to pharmacy/plan contracts, or as the patient moves through the different stages of their insurance plan.   PA #/Case ID/Reference #: [id number]

## 2024-11-14 ENCOUNTER — Other Ambulatory Visit: Payer: Self-pay | Admitting: Nurse Practitioner

## 2024-11-14 DIAGNOSIS — E119 Type 2 diabetes mellitus without complications: Secondary | ICD-10-CM

## 2024-11-24 ENCOUNTER — Other Ambulatory Visit: Payer: Self-pay | Admitting: Nurse Practitioner

## 2024-11-24 DIAGNOSIS — M51369 Other intervertebral disc degeneration, lumbar region without mention of lumbar back pain or lower extremity pain: Secondary | ICD-10-CM

## 2024-12-02 ENCOUNTER — Telehealth: Payer: Self-pay | Admitting: Family Medicine

## 2024-12-02 NOTE — Telephone Encounter (Signed)
 Appt scheduled 12/15/2024

## 2024-12-02 NOTE — Telephone Encounter (Signed)
 Copied from CRM #[id number]. Topic: Clinical - Request for Lab/Test Order >> Dec 02, 2024 11:30 AM Myrick T wrote: Reason for CRM: patient called stated she need to have a mammogram done but when orders are put in she would like to come to the mobile mammography bus

## 2024-12-15 ENCOUNTER — Inpatient Hospital Stay: Admission: RE | Admit: 2024-12-15 | Source: Ambulatory Visit

## 2024-12-23 ENCOUNTER — Other Ambulatory Visit: Payer: Self-pay | Admitting: Nurse Practitioner

## 2024-12-23 DIAGNOSIS — M51369 Other intervertebral disc degeneration, lumbar region without mention of lumbar back pain or lower extremity pain: Secondary | ICD-10-CM

## 2025-01-16 ENCOUNTER — Other Ambulatory Visit: Payer: Self-pay | Admitting: Nurse Practitioner

## 2025-01-16 DIAGNOSIS — Z1231 Encounter for screening mammogram for malignant neoplasm of breast: Secondary | ICD-10-CM

## 2025-01-19 ENCOUNTER — Inpatient Hospital Stay: Admission: RE | Admit: 2025-01-19 | Source: Ambulatory Visit

## 2025-03-06 ENCOUNTER — Ambulatory Visit: Payer: Self-pay | Admitting: Nurse Practitioner
